# Patient Record
Sex: Male | Born: 1945 | Race: White | Hispanic: No | Marital: Married | Smoking: Never smoker
Health system: Southern US, Community
[De-identification: ages and names within clinical notes are randomized; demographics above are authoritative.]

## PROBLEM LIST (undated history)

## (undated) DIAGNOSIS — R350 Frequency of micturition: Secondary | ICD-10-CM

## (undated) DIAGNOSIS — I10 Essential (primary) hypertension: Secondary | ICD-10-CM

## (undated) DIAGNOSIS — M549 Dorsalgia, unspecified: Secondary | ICD-10-CM

## (undated) DIAGNOSIS — N529 Male erectile dysfunction, unspecified: Secondary | ICD-10-CM

## (undated) DIAGNOSIS — E7800 Pure hypercholesterolemia, unspecified: Secondary | ICD-10-CM

## (undated) DIAGNOSIS — I350 Nonrheumatic aortic (valve) stenosis: Secondary | ICD-10-CM

## (undated) DIAGNOSIS — K219 Gastro-esophageal reflux disease without esophagitis: Secondary | ICD-10-CM

## (undated) DIAGNOSIS — M1990 Unspecified osteoarthritis, unspecified site: Secondary | ICD-10-CM

## (undated) HISTORY — DX: Gastro-esophageal reflux disease without esophagitis: K21.9

## (undated) HISTORY — DX: Essential (primary) hypertension: I10

## (undated) HISTORY — DX: Dorsalgia, unspecified: M54.9

## (undated) HISTORY — DX: Pure hypercholesterolemia, unspecified: E78.00

## (undated) HISTORY — PX: LUMBAR DISC SURGERY: SHX700

## (undated) HISTORY — DX: Male erectile dysfunction, unspecified: N52.9

## (undated) HISTORY — PX: TONSILLECTOMY: SUR1361

## (undated) HISTORY — DX: Nonrheumatic aortic (valve) stenosis: I35.0

---

## 2011-04-01 ENCOUNTER — Ambulatory Visit (INDEPENDENT_AMBULATORY_CARE_PROVIDER_SITE_OTHER): Payer: Medicare Other

## 2011-04-01 DIAGNOSIS — J019 Acute sinusitis, unspecified: Secondary | ICD-10-CM

## 2011-04-01 DIAGNOSIS — Z23 Encounter for immunization: Secondary | ICD-10-CM

## 2011-04-01 DIAGNOSIS — J209 Acute bronchitis, unspecified: Secondary | ICD-10-CM

## 2013-03-05 ENCOUNTER — Encounter: Payer: Self-pay | Admitting: *Deleted

## 2013-03-05 ENCOUNTER — Encounter: Payer: Self-pay | Admitting: Interventional Cardiology

## 2013-03-06 ENCOUNTER — Encounter: Payer: Self-pay | Admitting: Interventional Cardiology

## 2013-03-06 ENCOUNTER — Ambulatory Visit (INDEPENDENT_AMBULATORY_CARE_PROVIDER_SITE_OTHER): Payer: Medicare Other | Admitting: Interventional Cardiology

## 2013-03-06 VITALS — BP 131/72 | HR 69 | Ht 71.0 in | Wt 198.0 lb

## 2013-03-06 DIAGNOSIS — I10 Essential (primary) hypertension: Secondary | ICD-10-CM

## 2013-03-06 DIAGNOSIS — K219 Gastro-esophageal reflux disease without esophagitis: Secondary | ICD-10-CM

## 2013-03-06 DIAGNOSIS — E785 Hyperlipidemia, unspecified: Secondary | ICD-10-CM | POA: Insufficient documentation

## 2013-03-06 DIAGNOSIS — I359 Nonrheumatic aortic valve disorder, unspecified: Secondary | ICD-10-CM | POA: Insufficient documentation

## 2013-03-06 DIAGNOSIS — I119 Hypertensive heart disease without heart failure: Secondary | ICD-10-CM

## 2013-03-06 NOTE — Progress Notes (Signed)
Patient ID: Zachary [name], male   DOB: [birth year], 67 y.o.   MRN: [id number]   Date: 03/06/2013 ID: Zachary [name], DOB 1945/08/21, MRN [id number] PCP: Lupe Carney, MD  Reason: Aortic stenosis  ASSESSMENT;  1. Aortic stenosis, asymptomatic. Moderately severe based on the echo information from 2013. 2. Hypertension, controlled 3. Hyperlipidemia 4. Acid reflux  PLAN:  1. 2 D doppler echo to assess for significant interval change compared to one year ago. 2. We spent time discussing the cardinal symptoms of aortic stenosis. We also discussed the natural history and reason for yearly echo for followup. 3. We discussed reporting symptoms as they arise.   SUBJECTIVE: Zachary [name] is a 67 y.o. male who is referred for evaluation of aortic stenosis. He voices no limitations in his activities. He specifically denies angina, dyspnea, and syncope. He did not know of having a heart murmur during his childhood or early adulthood. He was told a heart murmur during or me physicals. Only recently as he been told about this. One year ago he had an echocardiogram performed at the "[facility] which revealed moderately severe aortic stenosis with a peak velocity of 3.64 m/s. He's had no followup since that time.   No Known Allergies  Current Outpatient Prescriptions on File Prior to Visit  Medication Sig Dispense Refill  . lisinopril (PRINIVIL,ZESTRIL) 10 MG tablet Take 10 mg by mouth daily.      . ranitidine (ZANTAC) 150 MG capsule Take 150 mg by mouth 2 (two) times daily.      . rosuvastatin (CRESTOR) 10 MG tablet Take 10 mg by mouth daily.       No current facility-administered medications on file prior to visit.    Past Medical History  Diagnosis Date  . HTN (hypertension)   . Erectile dysfunction   . Hypercholesteremia   . GERD (gastroesophageal reflux disease)   . Back pain   . Aortic valvar stenosis     Past Surgical History  Procedure Laterality Date  . Tonsillectomy      . Lumbar disc surgery      History   Social History  . Marital Status: Single    Spouse Name: N/A    Number of Children: N/A  . Years of Education: N/A   Occupational History  . Not on file.   Social History Main Topics  . Smoking status: Never Smoker   . Smokeless tobacco: Not on file  . Alcohol Use: No  . Drug Use: No  . Sexual Activity: Not on file   Other Topics Concern  . Not on file   Social History Narrative  . No narrative on file    Family History  Problem Relation Age of Onset  . Heart disease Mother   . Diabetes Mother     ROS: Denies edema, claudication, transient neurological symptoms, palpitations, orthopnea, PND, and chest pain.. Other systems negative for complaints.  OBJECTIVE: BP 131/72  Pulse 69  Ht 5\' 11"  (1.803 m)  Wt 198 lb (89.812 kg)  BMI 27.63 kg/m2,  General: No acute distress, appears healthy HEENT: normal with good skin color Neck: JVD flat. Carotids 2+ right carotid with transmitted bruit. 1+ left carotid also with transmitted bruit. Chest: Clear to auscultation and percussion Cardiac: Murmur: 2-3/6 crescendo decrescendo systolic murmur loudest at the right upper and left upper sternal border. The murmur is also heard at the left lower sternal border.. Gallop: An S4 gallop is audible.Marland Kitchen Rhythm: Regular.. Other: No diastolic murmurs heard.  Abdomen: Bruit: Absent. Pulsation: Absent . Abdomen is soft. No tenderness is noted. Extremities: Edema: Absent. Pulses: Bilateral 2+ and symmetric Neuro: Normal Psych: Anxious  ECG: Normal

## 2013-03-06 NOTE — Patient Instructions (Signed)
Your physician recommends that you continue on your current medications as directed. Please refer to the Current Medication list given to you today.  Your physician has requested that you have an echocardiogram. Echocardiography is a painless test that uses sound waves to create images of your heart. It provides your doctor with information about the size and shape of your heart and how well your heart's chambers and valves are working. This procedure takes approximately one hour. There are no restrictions for this procedure.  Your physician wants you to follow-up in: 1 year You will receive a reminder letter in the mail two months in advance. If you don't receive a letter, please call our office to schedule the follow-up appointment.  If you are having any of the following symptoms: shortness of breath, syncope(passing out), chest pain/pressure please call the office [phone]

## 2013-03-25 ENCOUNTER — Ambulatory Visit (HOSPITAL_COMMUNITY): Payer: Medicare Other | Attending: Cardiology | Admitting: Radiology

## 2013-03-25 ENCOUNTER — Encounter: Payer: Self-pay | Admitting: Cardiology

## 2013-03-25 DIAGNOSIS — E785 Hyperlipidemia, unspecified: Secondary | ICD-10-CM | POA: Insufficient documentation

## 2013-03-25 DIAGNOSIS — I359 Nonrheumatic aortic valve disorder, unspecified: Secondary | ICD-10-CM | POA: Insufficient documentation

## 2013-03-25 NOTE — Progress Notes (Signed)
Echocardiogram performed.  

## 2013-03-26 ENCOUNTER — Telehealth: Payer: Self-pay

## 2013-03-26 NOTE — Telephone Encounter (Signed)
pt given results of echo.Moderate thickening of the left ventricle, normal ejection, moderate aortic stenosis. No significant change since the prior study. He needs clinical followup in one year.pt verbalized understanding.

## 2013-03-26 NOTE — Telephone Encounter (Signed)
Message copied by Jarvis Newcomer on Wed Mar 26, 2013  9:37 AM ------      Message from: Verdis Prime      Created: Tue Mar 25, 2013  5:38 PM       Moderate thickening of the left ventricle, normal ejection, moderate aortic stenosis. No significant change since the prior study. He needs clinical followup in one year. ------

## 2014-03-05 ENCOUNTER — Encounter: Payer: Self-pay | Admitting: Interventional Cardiology

## 2014-03-05 ENCOUNTER — Ambulatory Visit (INDEPENDENT_AMBULATORY_CARE_PROVIDER_SITE_OTHER): Payer: Medicare Other | Admitting: Interventional Cardiology

## 2014-03-05 VITALS — BP 122/80 | HR 81 | Ht 71.0 in | Wt 199.0 lb

## 2014-03-05 DIAGNOSIS — I359 Nonrheumatic aortic valve disorder, unspecified: Secondary | ICD-10-CM

## 2014-03-05 DIAGNOSIS — E785 Hyperlipidemia, unspecified: Secondary | ICD-10-CM

## 2014-03-05 DIAGNOSIS — I119 Hypertensive heart disease without heart failure: Secondary | ICD-10-CM

## 2014-03-05 NOTE — Patient Instructions (Signed)
Your physician recommends that you continue on your current medications as directed. Please refer to the Current Medication list given to you today.  Your physician has requested that you have an echocardiogram. Echocardiography is a painless test that uses sound waves to create images of your heart. It provides your doctor with information about the size and shape of your heart and how well your heart's chambers and valves are working. This procedure takes approximately one hour. There are no restrictions for this procedure.( To be scheduled in 11 months)  Your physician wants you to follow-up in: 12 months with Dr.Smith You will receive a reminder letter in the mail two months in advance. If you don't receive a letter, please call our office to schedule the follow-up appointment.

## 2014-03-05 NOTE — Progress Notes (Signed)
Patient ID: Zachary [name], male   DOB: Oct 07, 1945, 68 y.o.   MRN: [id number]    [address], [address], [place]  [address] Phone: [phone] Fax:  [phone]  Date:  03/05/2014   ID:  Zachary [name], DOB 1946-03-12, MRN [id number]  PCP:  [name] Coffin, MD   ASSESSMENT:  1. Moderate aortic stenosis, without symptoms 2. Hypertension, essential3. Hyperlipidemia  PLAN:  1. 2-D Doppler echocardiogram and 11 months 2. Office visit in 12 months 3. Notify if angina, syncope, or dyspnea   SUBJECTIVE: Zachary [name] is a 68 y.o. male who is doing well. He is not quite as vigorously participating in activities as he has over the past 2 or 3 years. He is retired. He doesn't have the same interest that he had. He does still get out and do nearly everything that he wants to do. He has had some dizziness associated with tinnitus   Wt Readings from Last 3 Encounters:  03/05/14 199 lb (90.266 kg)  03/06/13 198 lb (89.812 kg)     Past Medical History  Diagnosis Date  . HTN (hypertension)   . Erectile dysfunction   . Hypercholesteremia   . GERD (gastroesophageal reflux disease)   . Back pain   . Aortic valvar stenosis     Current Outpatient Prescriptions  Medication Sig Dispense Refill  . acetaminophen (TYLENOL) 500 MG tablet Take 500 mg by mouth every 6 (six) hours as needed.    Marland Kitchen lisinopril (PRINIVIL,ZESTRIL) 10 MG tablet Take 10 mg by mouth daily.    . ranitidine (ZANTAC) 150 MG capsule Take 150 mg by mouth 2 (two) times daily.    . rosuvastatin (CRESTOR) 10 MG tablet Take 10 mg by mouth daily.     No current facility-administered medications for this visit.    Allergies:   No Known Allergies  Social History:  The patient  reports that he has never smoked. He does not have any smokeless tobacco history on file. He reports that he does not drink alcohol or use illicit drugs.   ROS:  Please see the history of present illness.   Orthopnea, PND, lower extremity  swelling, palpitations, or syncope   All other systems reviewed and negative.   OBJECTIVE: VS:  BP 122/80 mmHg  Pulse 81  Ht 5\' 11"  (1.803 m)  Wt 199 lb (90.266 kg)  BMI 27.77 kg/m2 Well nourished, well developed, in no acute distress,over the stated age 51: normal Neck: JVD flat. Carotid bruit absent  Cardiac:  normal S1, S2; RRR; 2 3/6 high-pitched crescendo decrescendo apical and left mid sternal murmur loudest at the right upper sternal border. No diastolic murmurs heard. Lungs:  clear to auscultation bilaterally, no wheezing, rhonchi or rales Abd: soft, nontender, no hepatomegaly Ext: Edema absent. Pulses 2+ and symmetric Skin: warm and dry Neuro:  CNs 2-12 intact, no focal abnormalities noted  EKG:  Normal sinus rhythm with small inferior Q waves. Unchanged from prior tracing. Prominent voltage.       Signed, Illene Labrador III, MD 03/05/2014 2:45 PM

## 2014-03-16 ENCOUNTER — Ambulatory Visit: Payer: Medicare Other | Admitting: Interventional Cardiology

## 2014-10-19 ENCOUNTER — Other Ambulatory Visit: Payer: Self-pay

## 2015-02-10 ENCOUNTER — Other Ambulatory Visit (HOSPITAL_COMMUNITY): Payer: Medicare Other

## 2015-02-10 ENCOUNTER — Ambulatory Visit (HOSPITAL_COMMUNITY): Payer: Medicare Other | Attending: Cardiology

## 2015-02-10 ENCOUNTER — Other Ambulatory Visit: Payer: Self-pay

## 2015-02-10 DIAGNOSIS — I10 Essential (primary) hypertension: Secondary | ICD-10-CM | POA: Diagnosis not present

## 2015-02-10 DIAGNOSIS — E785 Hyperlipidemia, unspecified: Secondary | ICD-10-CM | POA: Diagnosis not present

## 2015-02-10 DIAGNOSIS — I352 Nonrheumatic aortic (valve) stenosis with insufficiency: Secondary | ICD-10-CM | POA: Diagnosis not present

## 2015-02-10 DIAGNOSIS — I77810 Thoracic aortic ectasia: Secondary | ICD-10-CM | POA: Insufficient documentation

## 2015-02-10 DIAGNOSIS — I517 Cardiomegaly: Secondary | ICD-10-CM | POA: Diagnosis not present

## 2015-02-10 DIAGNOSIS — I359 Nonrheumatic aortic valve disorder, unspecified: Secondary | ICD-10-CM | POA: Diagnosis not present

## 2015-02-19 ENCOUNTER — Telehealth: Payer: Self-pay | Admitting: Interventional Cardiology

## 2015-02-19 NOTE — Telephone Encounter (Signed)
Called patient's wife.  Told her that her request for earlier appt is being forwarded to to Dr Tamala Julian and his nurse for scheduling.  Appt is for yearly f/u.

## 2015-02-19 NOTE — Telephone Encounter (Signed)
New message   Pt wife is calling and she wants to know have you been working on getting pt in to see Dr.Smith  I offered first available and with a PA she declined

## 2015-02-22 NOTE — Telephone Encounter (Signed)
Called to schedule work in appt with [place]. Zachary [name]

## 2015-02-22 NOTE — Telephone Encounter (Signed)
Pt wife aware of work in appt with [place]. Scheduled for 11/9 @ 4pm. She sts that pt is asymptomatic, and anxious about his upcoming appt. Adv pt to call the office if symptoms develop (CP, syncope, sob). Pt wife verbalized understanding.

## 2015-02-22 NOTE — Telephone Encounter (Signed)
F/u    Pt's wife returning Lisa's phone call.

## 2015-03-03 ENCOUNTER — Encounter: Payer: Self-pay | Admitting: Interventional Cardiology

## 2015-03-03 ENCOUNTER — Ambulatory Visit (INDEPENDENT_AMBULATORY_CARE_PROVIDER_SITE_OTHER): Payer: Medicare Other | Admitting: Interventional Cardiology

## 2015-03-03 VITALS — BP 100/58 | HR 86 | Ht 72.0 in | Wt 210.4 lb

## 2015-03-03 DIAGNOSIS — I119 Hypertensive heart disease without heart failure: Secondary | ICD-10-CM

## 2015-03-03 DIAGNOSIS — E785 Hyperlipidemia, unspecified: Secondary | ICD-10-CM

## 2015-03-03 DIAGNOSIS — I359 Nonrheumatic aortic valve disorder, unspecified: Secondary | ICD-10-CM | POA: Diagnosis not present

## 2015-03-03 LAB — CBC WITH DIFFERENTIAL/PLATELET
BASOS PCT: 1 % (ref 0–1)
Basophils Absolute: 0.1 10*3/uL (ref 0.0–0.1)
EOS ABS: 0.4 10*3/uL (ref 0.0–0.7)
Eosinophils Relative: 5 % (ref 0–5)
HCT: 41.3 % (ref 39.0–52.0)
Hemoglobin: 14.1 g/dL (ref 13.0–17.0)
LYMPHS ABS: 2.5 10*3/uL (ref 0.7–4.0)
Lymphocytes Relative: 32 % (ref 12–46)
MCH: 32.7 pg (ref 26.0–34.0)
MCHC: 34.1 g/dL (ref 30.0–36.0)
MCV: 95.8 fL (ref 78.0–100.0)
MONO ABS: 0.8 10*3/uL (ref 0.1–1.0)
MONOS PCT: 10 % (ref 3–12)
MPV: 9.3 fL (ref 8.6–12.4)
Neutro Abs: 4.1 10*3/uL (ref 1.7–7.7)
Neutrophils Relative %: 52 % (ref 43–77)
PLATELETS: 257 10*3/uL (ref 150–400)
RBC: 4.31 MIL/uL (ref 4.22–5.81)
RDW: 12.7 % (ref 11.5–15.5)
WBC: 7.8 10*3/uL (ref 4.0–10.5)

## 2015-03-03 LAB — BASIC METABOLIC PANEL
BUN: 16 mg/dL (ref 7–25)
CALCIUM: 9.2 mg/dL (ref 8.6–10.3)
CHLORIDE: 104 mmol/L (ref 98–110)
CO2: 30 mmol/L (ref 20–31)
CREATININE: 1.3 mg/dL — AB (ref 0.70–1.25)
GLUCOSE: 93 mg/dL (ref 65–99)
Potassium: 4.5 mmol/L (ref 3.5–5.3)
Sodium: 140 mmol/L (ref 135–146)

## 2015-03-03 LAB — PROTIME-INR
INR: 0.96 (ref <1.50)
PROTHROMBIN TIME: 12.9 s (ref 11.6–15.2)

## 2015-03-03 NOTE — Progress Notes (Signed)
Cardiology Office Note   Date:  03/03/2015   ID:  Zachary [name], DOB [birth year], MRN [id number]  PCP:  Lester Kinsman, PA-C  Cardiologist:  Sinclair Grooms, MD   Chief Complaint  Patient presents with  . Aortic Stenosis      History of Present Illness: Zachary [name] is a 69 y.o. male who presents for  Calcific aortic stenosis, hypertension, erectile dysfunction, and hyperlipidemia.   Since the last office visit the patient has experienced exertional fatigue. He has had 2 episodes of dizziness but no syncope. He denies chest discomfort. He has not had lower extremity swelling or orthopnea. No prolonged palpitations or arrhythmias.    Past Medical History  Diagnosis Date  . HTN (hypertension)   . Erectile dysfunction   . Hypercholesteremia   . GERD (gastroesophageal reflux disease)   . Back pain   . Aortic valvar stenosis     Past Surgical History  Procedure Laterality Date  . Tonsillectomy    . Lumbar disc surgery       Current Outpatient Prescriptions  Medication Sig Dispense Refill  . acetaminophen (TYLENOL) 500 MG tablet Take 500 mg by mouth every 6 (six) hours as needed for mild pain.    Marland Kitchen atorvastatin (LIPITOR) 40 MG tablet Take 40 mg by mouth daily.    Marland Kitchen lisinopril (PRINIVIL,ZESTRIL) 10 MG tablet Take 10 mg by mouth daily.    . ranitidine (ZANTAC) 150 MG capsule Take 150 mg by mouth 2 (two) times daily.     No current facility-administered medications for this visit.    Allergies:   Review of patient's allergies indicates no known allergies.    Social History:  The patient  reports that he has never smoked. He has never used smokeless tobacco. He reports that he does not drink alcohol or use illicit drugs.   Family History:  The patient's family history includes Cancer in his father; Diabetes in his mother; Healthy in his sister, sister, and sister; Heart disease in his mother; Hyperlipidemia in his brother.    ROS:  Please see the history of  present illness.   Otherwise, review of systems are positive for  Other than fatigue, no significant complaints..   All other systems are reviewed and negative.    PHYSICAL EXAM: VS:  BP 100/58 mmHg  Pulse 86  Ht 6' (1.829 m)  Wt 95.437 kg (210 lb 6.4 oz)  BMI 28.53 kg/m2  SpO2 94% , BMI Body mass index is 28.53 kg/(m^2). GEN: Well nourished, well developed, in no acute distress HEENT: normal Neck: no JVD, carotid bruits, or masses Cardiac: RRR.  There  Is a 4/6 medium pitched crescendo decrescendo systolic murmur  Along the left lower sternal border and at the apex. Murmur radiates to the left upper sternal border. There is no rub but an S4 gallop is heard. There is  no edema. Respiratory:  clear to auscultation bilaterally, normal work of breathing. GI: soft, nontender, nondistended, + BS MS: no deformity or atrophy Skin: warm and dry, no rash Neuro:  Strength and sensation are intact Psych: euthymic mood, full affect   EKG:  EKG  Is not ordered today.    Recent Labs: No results found for requested labs within last 365 days.    Lipid Panel No results found for: CHOL, TRIG, HDL, CHOLHDL, VLDL, LDLCALC, LDLDIRECT    Wt Readings from Last 3 Encounters:  03/03/15 95.437 kg (210 lb 6.4 oz)  03/05/14 90.266 kg (199 lb)  03/06/13 89.812 kg (198 lb)      Other studies Reviewed: Additional studies/ records that were reviewed today include:  Serial echoes done between 2013 and now. The findings include  Peak velocity across the aortic valve is 4.43 m/s , moderate LVH, preserved EF of 65%.  There also appears to be mild mitral stenosis by exam.    ASSESSMENT AND PLAN:  1. Aortic valve disorder  Critical aortic stenosis with peak velocity greater than 4.4 m/s. Patient also has symptoms. 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 Hyperlipidemia  On therapy   4. Possible mitral stenosis Mentioned  on echocardiography. We will obtain hemodynamic data at the time of coronary angiography and right heart cath.  Current medicines are reviewed at length with the patient today.  The patient has the following concerns regarding medicines:  none.  The following changes/actions have been instituted:     Left heart , right heart, and coronary angiography.The patient was advised about the risks and benefits of coronary angiography. The risks including death, stroke, kidney failure, heart attack, limb ischemia, bleeding and allergy were discussed and accepted by the patient.  Questions were answered and the patient accepts the procedure and willing to proceed.   plan is referral 2 heart valve teeny after left and right heart cath.  Labs/ tests ordered today include:  Orders Placed This Encounter  Procedures  . Basic metabolic panel  . CBC w/Diff  . INR/PT     Disposition:   FU with HS in 1 month  Signed, Sinclair Grooms, MD  03/03/2015 6:39 PM    [facility] [place], [place], [place]  [address] Phone: [phone]; Fax: [phone]

## 2015-03-03 NOTE — Patient Instructions (Signed)
Medication Instructions:  Your physician recommends that you continue on your current medications as directed. Please refer to the Current Medication list given to you today.   Labwork: Bmet, Cbc, Pt/Inr today  Testing/Procedures: Your physician has requested that you have a cardiac catheterization. Cardiac catheterization is used to diagnose and/or treat various heart conditions. Doctors may recommend this procedure for a number of different reasons. The most common reason is to evaluate chest pain. Chest pain can be a symptom of coronary artery disease (CAD), and cardiac catheterization can show whether plaque is narrowing or blocking your heart's arteries. This procedure is also used to evaluate the valves, as well as measure the blood flow and oxygen levels in different parts of your heart. For further information please visit [email or web address]. Please follow instruction sheet, as given.   Follow-Up: Your physician recommends that you schedule a follow-up appointment pending procedure   Any Other Special Instructions Will Be Listed Below (If Applicable).     If you need a refill on your cardiac medications before your next appointment, please call your pharmacy.

## 2015-03-06 ENCOUNTER — Other Ambulatory Visit: Payer: Self-pay | Admitting: Interventional Cardiology

## 2015-03-10 ENCOUNTER — Ambulatory Visit (HOSPITAL_COMMUNITY)
Admission: RE | Admit: 2015-03-10 | Discharge: 2015-03-10 | Disposition: A | Discharge status: Home / Self Care | Payer: Medicare Other | Source: Ambulatory Visit | Attending: Interventional Cardiology | Admitting: Interventional Cardiology

## 2015-03-10 ENCOUNTER — Encounter (HOSPITAL_COMMUNITY)
Admission: RE | Disposition: A | Discharge status: Home / Self Care | Payer: Medicare Other | Source: Ambulatory Visit | Attending: Interventional Cardiology

## 2015-03-10 DIAGNOSIS — Z8249 Family history of ischemic heart disease and other diseases of the circulatory system: Secondary | ICD-10-CM | POA: Insufficient documentation

## 2015-03-10 DIAGNOSIS — N529 Male erectile dysfunction, unspecified: Secondary | ICD-10-CM | POA: Diagnosis not present

## 2015-03-10 DIAGNOSIS — E7800 Pure hypercholesterolemia, unspecified: Secondary | ICD-10-CM | POA: Diagnosis not present

## 2015-03-10 DIAGNOSIS — K219 Gastro-esophageal reflux disease without esophagitis: Secondary | ICD-10-CM | POA: Insufficient documentation

## 2015-03-10 DIAGNOSIS — I359 Nonrheumatic aortic valve disorder, unspecified: Secondary | ICD-10-CM | POA: Diagnosis present

## 2015-03-10 DIAGNOSIS — I119 Hypertensive heart disease without heart failure: Secondary | ICD-10-CM | POA: Diagnosis present

## 2015-03-10 DIAGNOSIS — I25119 Atherosclerotic heart disease of native coronary artery with unspecified angina pectoris: Secondary | ICD-10-CM | POA: Diagnosis not present

## 2015-03-10 DIAGNOSIS — I350 Nonrheumatic aortic (valve) stenosis: Secondary | ICD-10-CM | POA: Insufficient documentation

## 2015-03-10 DIAGNOSIS — I2510 Atherosclerotic heart disease of native coronary artery without angina pectoris: Secondary | ICD-10-CM

## 2015-03-10 HISTORY — PX: CARDIAC CATHETERIZATION: SHX172

## 2015-03-10 LAB — POCT ACTIVATED CLOTTING TIME
Activated Clotting Time: 202 seconds
Activated Clotting Time: 251 seconds

## 2015-03-10 LAB — POCT I-STAT 3, ART BLOOD GAS (G3+)
ACID-BASE DEFICIT: 2 mmol/L (ref 0.0–2.0)
BICARBONATE: 24.2 meq/L — AB (ref 20.0–24.0)
O2 SAT: 95 %
PO2 ART: 81 mmHg (ref 80.0–100.0)
TCO2: 26 mmol/L (ref 0–100)
pCO2 arterial: 45.5 mmHg — ABNORMAL HIGH (ref 35.0–45.0)
pH, Arterial: 7.333 — ABNORMAL LOW (ref 7.350–7.450)

## 2015-03-10 LAB — POCT I-STAT 3, VENOUS BLOOD GAS (G3P V)
ACID-BASE DEFICIT: 3 mmol/L — AB (ref 0.0–2.0)
BICARBONATE: 23.6 meq/L (ref 20.0–24.0)
O2 SAT: 61 %
PCO2 VEN: 46.8 mmHg (ref 45.0–50.0)
PO2 VEN: 35 mmHg (ref 30.0–45.0)
TCO2: 25 mmol/L (ref 0–100)
pH, Ven: 7.311 — ABNORMAL HIGH (ref 7.250–7.300)

## 2015-03-10 SURGERY — RIGHT/LEFT HEART CATH AND CORONARY ANGIOGRAPHY
Anesthesia: LOCAL

## 2015-03-10 MED ORDER — MIDAZOLAM HCL 2 MG/2ML IJ SOLN
INTRAMUSCULAR | Status: AC
  Filled 2015-03-10: qty 2

## 2015-03-10 MED ORDER — FENTANYL CITRATE (PF) 100 MCG/2ML IJ SOLN
INTRAMUSCULAR | Status: AC
  Filled 2015-03-10: qty 2

## 2015-03-10 MED ORDER — SODIUM CHLORIDE 0.9 % WEIGHT BASED INFUSION: 1.0000 mL/kg/h | INTRAVENOUS | Status: AC

## 2015-03-10 MED ORDER — LIDOCAINE HCL (PF) 1 % IJ SOLN
INTRAMUSCULAR | Status: AC
  Filled 2015-03-10: qty 30

## 2015-03-10 MED ORDER — ASPIRIN 81 MG PO CHEW
81.0000 mg | CHEWABLE_TABLET | ORAL | Status: AC
  Administered 2015-03-10: 81 mg via ORAL

## 2015-03-10 MED ORDER — MIDAZOLAM HCL 2 MG/2ML IJ SOLN
INTRAMUSCULAR | Status: DC | PRN
  Administered 2015-03-10 (×2): 1 mg via INTRAVENOUS

## 2015-03-10 MED ORDER — ADENOSINE (DIAGNOSTIC) 140MCG/KG/MIN
INTRAVENOUS | Status: DC | PRN
  Administered 2015-03-10: 140 ug/kg/min via INTRAVENOUS

## 2015-03-10 MED ORDER — OXYCODONE-ACETAMINOPHEN 5-325 MG PO TABS: 1.0000 | ORAL_TABLET | ORAL | Status: DC | PRN

## 2015-03-10 MED ORDER — ACETAMINOPHEN 325 MG PO TABS: 650.0000 mg | ORAL_TABLET | ORAL | Status: DC | PRN

## 2015-03-10 MED ORDER — SODIUM CHLORIDE 0.9 % IV SOLN
INTRAVENOUS | Status: DC
  Administered 2015-03-10 (×2): via INTRAVENOUS

## 2015-03-10 MED ORDER — SODIUM CHLORIDE 0.9 % IV SOLN: 250.0000 mL | INTRAVENOUS | Status: DC | PRN

## 2015-03-10 MED ORDER — ASPIRIN 81 MG PO CHEW
CHEWABLE_TABLET | ORAL | Status: AC
  Administered 2015-03-10: 81 mg via ORAL
  Filled 2015-03-10: qty 1

## 2015-03-10 MED ORDER — FENTANYL CITRATE (PF) 100 MCG/2ML IJ SOLN
INTRAMUSCULAR | Status: DC | PRN
  Administered 2015-03-10: 50 ug via INTRAVENOUS

## 2015-03-10 MED ORDER — SODIUM CHLORIDE 0.9 % IJ SOLN: 3.0000 mL | Freq: Two times a day (BID) | INTRAMUSCULAR | Status: DC

## 2015-03-10 MED ORDER — HEPARIN SODIUM (PORCINE) 1000 UNIT/ML IJ SOLN
INTRAMUSCULAR | Status: DC | PRN
  Administered 2015-03-10: 3000 [IU] via INTRAVENOUS
  Administered 2015-03-10: 4500 [IU] via INTRAVENOUS

## 2015-03-10 MED ORDER — ONDANSETRON HCL 4 MG/2ML IJ SOLN: 4.0000 mg | Freq: Four times a day (QID) | INTRAMUSCULAR | Status: DC | PRN

## 2015-03-10 MED ORDER — VERAPAMIL HCL 2.5 MG/ML IV SOLN
INTRAVENOUS | Status: DC | PRN
  Administered 2015-03-10: 11:00:00 via INTRA_ARTERIAL

## 2015-03-10 MED ORDER — HEPARIN SODIUM (PORCINE) 1000 UNIT/ML IJ SOLN
INTRAMUSCULAR | Status: AC
  Filled 2015-03-10: qty 1

## 2015-03-10 MED ORDER — ADENOSINE 12 MG/4ML IV SOLN
16.0000 mL | Freq: Once | INTRAVENOUS | Status: DC
  Filled 2015-03-10: qty 16

## 2015-03-10 MED ORDER — HEPARIN (PORCINE) IN NACL 2-0.9 UNIT/ML-% IJ SOLN
INTRAMUSCULAR | Status: AC
Start: 2015-03-10 — End: 2015-03-10
  Filled 2015-03-10: qty 1000

## 2015-03-10 MED ORDER — SODIUM CHLORIDE 0.9 % IJ SOLN: 3.0000 mL | INTRAMUSCULAR | Status: DC | PRN

## 2015-03-10 MED ORDER — VERAPAMIL HCL 2.5 MG/ML IV SOLN
INTRAVENOUS | Status: AC
  Filled 2015-03-10: qty 2

## 2015-03-10 SURGICAL SUPPLY — 17 items
CATH BALLN WEDGE 5F 110CM (CATHETERS) ×1 IMPLANT
CATH INFINITI 5 FR JL3.5 (CATHETERS) ×2 IMPLANT
CATH INFINITI 5FR JL5 (CATHETERS) ×1 IMPLANT
CATH INFINITI JR4 5F (CATHETERS) ×2 IMPLANT
CATH VISTA GUIDE 6FR XBLAD3.5 (CATHETERS) ×1 IMPLANT
DEVICE RAD COMP TR BAND LRG (VASCULAR PRODUCTS) ×2 IMPLANT
GLIDESHEATH SLEND A-KIT 6F 22G (SHEATH) ×2 IMPLANT
GUIDEWIRE PRESSURE COMET II (WIRE) ×1 IMPLANT
KIT ESSENTIALS PG (KITS) ×1 IMPLANT
KIT HEART LEFT (KITS) ×2 IMPLANT
PACK CARDIAC CATHETERIZATION (CUSTOM PROCEDURE TRAY) ×2 IMPLANT
SHEATH FAST CATH BRACH 5F 5CM (SHEATH) ×1 IMPLANT
TRANSDUCER W/STOPCOCK (MISCELLANEOUS) ×3 IMPLANT
TUBING CIL FLEX 10 FLL-RA (TUBING) ×2 IMPLANT
WIRE EMERALD ST .035X150CM (WIRE) ×1 IMPLANT
WIRE HI TORQ VERSACORE-J 145CM (WIRE) ×1 IMPLANT
WIRE SAFE-T 1.5MM-J .035X260CM (WIRE) ×3 IMPLANT

## 2015-03-10 NOTE — Interval H&P Note (Signed)
Cath Lab Visit (complete for each Cath Lab visit)  Clinical Evaluation Leading to the Procedure:   ACS: No.  Non-ACS:    Anginal Classification: CCS III  Anti-ischemic medical therapy: Minimal Therapy (1 class of medications)  Non-Invasive Test Results: No non-invasive testing performed  Prior CABG: No previous CABG      History and Physical Interval Note:  03/10/2015 10:06 AM  Zachary [name]  has presented today for surgery, with the diagnosis of aortic stenosis  The various methods of treatment have been discussed with the patient and family. After consideration of risks, benefits and other options for treatment, the patient has consented to  Procedure(s): Right/Left Heart Cath and Coronary Angiography (N/A) as a surgical intervention .  The patient's history has been reviewed, patient examined, no change in status, stable for surgery.  I have reviewed the patient's chart and labs.  Questions were answered to the patient's satisfaction.     Sinclair Grooms

## 2015-03-10 NOTE — H&P (View-Only) (Signed)
  Cardiology Office Note   Date:  03/03/2015   ID:  Zachary [name], DOB 08/30/1945, MRN [id number]  PCP:  FERGUSON, DOROTHY, PA-C  Cardiologist:  Kalayah Leske III,Deklyn Gibbon W, MD   Chief Complaint  Patient presents with  . Aortic Stenosis      History of Present Illness: Zachary [name] is a 69 y.o. male who presents for  Calcific aortic stenosis, hypertension, erectile dysfunction, and hyperlipidemia.   Since the last office visit the patient has experienced exertional fatigue. He has had 2 episodes of dizziness but no syncope. He denies chest discomfort. He has not had lower extremity swelling or orthopnea. No prolonged palpitations or arrhythmias.    Past Medical History  Diagnosis Date  . HTN (hypertension)   . Erectile dysfunction   . Hypercholesteremia   . GERD (gastroesophageal reflux disease)   . Back pain   . Aortic valvar stenosis     Past Surgical History  Procedure Laterality Date  . Tonsillectomy    . Lumbar disc surgery       Current Outpatient Prescriptions  Medication Sig Dispense Refill  . acetaminophen (TYLENOL) 500 MG tablet Take 500 mg by mouth every 6 (six) hours as needed for mild pain.    . atorvastatin (LIPITOR) 40 MG tablet Take 40 mg by mouth daily.    . lisinopril (PRINIVIL,ZESTRIL) 10 MG tablet Take 10 mg by mouth daily.    . ranitidine (ZANTAC) 150 MG capsule Take 150 mg by mouth 2 (two) times daily.     No current facility-administered medications for this visit.    Allergies:   Review of patient's allergies indicates no known allergies.    Social History:  The patient  reports that he has never smoked. He has never used smokeless tobacco. He reports that he does not drink alcohol or use illicit drugs.   Family History:  The patient's family history includes Cancer in his father; Diabetes in his mother; Healthy in his sister, sister, and sister; Heart disease in his mother; Hyperlipidemia in his brother.    ROS:  Please see the history of  present illness.   Otherwise, review of systems are positive for  Other than fatigue, no significant complaints..   All other systems are reviewed and negative.    PHYSICAL EXAM: VS:  BP 100/58 mmHg  Pulse 86  Ht 6' (1.829 m)  Wt 95.437 kg (210 lb 6.4 oz)  BMI 28.53 kg/m2  SpO2 94% , BMI Body mass index is 28.53 kg/(m^2). GEN: Well nourished, well developed, in no acute distress HEENT: normal Neck: no JVD, carotid bruits, or masses Cardiac: RRR.  There  Is a 4/6 medium pitched crescendo decrescendo systolic murmur  Along the left lower sternal border and at the apex. Murmur radiates to the left upper sternal border. There is no rub but an S4 gallop is heard. There is  no edema. Respiratory:  clear to auscultation bilaterally, normal work of breathing. GI: soft, nontender, nondistended, + BS MS: no deformity or atrophy Skin: warm and dry, no rash Neuro:  Strength and sensation are intact Psych: euthymic mood, full affect   EKG:  EKG  Is not ordered today.    Recent Labs: No results found for requested labs within last 365 days.    Lipid Panel No results found for: CHOL, TRIG, HDL, CHOLHDL, VLDL, LDLCALC, LDLDIRECT    Wt Readings from Last 3 Encounters:  03/03/15 95.437 kg (210 lb 6.4 oz)  03/05/14 90.266 kg (199 lb)    03/06/13 89.812 kg (198 lb)      Other studies Reviewed: Additional studies/ records that were reviewed today include:  Serial echoes done between 2013 and now. The findings include  Peak velocity across the aortic valve is 4.43 m/s , moderate LVH, preserved EF of 65%.  There also appears to be mild mitral stenosis by exam.    ASSESSMENT AND PLAN:  1. Aortic valve disorder  Critical aortic stenosis with peak velocity greater than 4.4 m/s. Patient also has symptoms. 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 Hyperlipidemia  On therapy   4. Possible mitral stenosis Mentioned  on echocardiography. We will obtain hemodynamic data at the time of coronary angiography and right heart cath.  Current medicines are reviewed at length with the patient today.  The patient has the following concerns regarding medicines:  none.  The following changes/actions have been instituted:     Left heart , right heart, and coronary angiography.The patient was advised about the risks and benefits of coronary angiography. The risks including death, stroke, kidney failure, heart attack, limb ischemia, bleeding and allergy were discussed and accepted by the patient.  Questions were answered and the patient accepts the procedure and willing to proceed.   plan is referral 2 heart valve teeny after left and right heart cath.  Labs/ tests ordered today include:  Orders Placed This Encounter  Procedures  . Basic metabolic panel  . CBC w/Diff  . INR/PT     Disposition:   FU with HS in 1 month  Signed, Anaily Ashbaugh III,Jermond Burkemper W, MD  03/03/2015 6:39 PM    [facility] [address], [place], [place]  [address] Phone: [phone]; Fax: [phone]   

## 2015-03-10 NOTE — Discharge Instructions (Signed)
Radial Site Care °Refer to this sheet in the next few weeks. These instructions provide you with information about caring for yourself after your procedure. Your health care provider may also give you more specific instructions. Your treatment has been planned according to current medical practices, but problems sometimes occur. Call your health care provider if you have any problems or questions after your procedure. °WHAT TO EXPECT AFTER THE PROCEDURE °After your procedure, it is typical to have the following: °· Bruising at the radial site that usually fades within 1-2 weeks. °· Blood collecting in the tissue (hematoma) that may be painful to the touch. It should usually decrease in size and tenderness within 1-2 weeks. °HOME CARE INSTRUCTIONS °· Take medicines only as directed by your health care provider. °· You may shower 24-48 hours after the procedure or as directed by your health care provider. Remove the bandage (dressing) and gently wash the site with plain soap and water. Pat the area dry with a clean towel. Do not rub the site, because this may cause bleeding. °· Do not take baths, swim, or use a hot tub until your health care provider approves. °· Check your insertion site every day for redness, swelling, or drainage. °· Do not apply powder or lotion to the site. °· Do not flex or bend the affected arm for 24 hours or as directed by your health care provider. °· Do not push or pull heavy objects with the affected arm for 24 hours or as directed by your health care provider. °· Do not lift over 10 lb (4.5 kg) for 5 days after your procedure or as directed by your health care provider. °· Ask your health care provider when it is okay to: °¨ Return to work or school. °¨ Resume usual physical activities or sports. °¨ Resume sexual activity. °· Do not drive home if you are discharged the same day as the procedure. Have someone else drive you. °· You may drive 24 hours after the procedure unless otherwise  instructed by your health care provider. °· Do not operate machinery or power tools for 24 hours after the procedure. °· If your procedure was done as an outpatient procedure, which means that you went home the same day as your procedure, a responsible adult should be with you for the first 24 hours after you arrive home. °· Keep all follow-up visits as directed by your health care provider. This is important. °SEEK MEDICAL CARE IF: °· You have a fever. °· You have chills. °· You have increased bleeding from the radial site. Hold pressure on the site. °SEEK IMMEDIATE MEDICAL CARE IF: °· You have unusual pain at the radial site. °· You have redness, warmth, or swelling at the radial site. °· You have drainage (other than a small amount of blood on the dressing) from the radial site. °· The radial site is bleeding, and the bleeding does not stop after 30 minutes of holding steady pressure on the site. °· Your arm or hand becomes pale, cool, tingly, or numb. °  °This information is not intended to replace advice given to you by your health care provider. Make sure you discuss any questions you have with your health care provider. °  °Document Released: 05/13/2010 Document Revised: 05/01/2014 Document Reviewed: 10/27/2013 °[employer] Interactive Patient Education ©2016 [employer] ° °

## 2015-03-11 ENCOUNTER — Encounter (HOSPITAL_COMMUNITY): Payer: Self-pay | Admitting: Interventional Cardiology

## 2015-03-11 MED FILL — Lidocaine HCl Local Preservative Free (PF) Inj 1%: INTRAMUSCULAR | Qty: 30 | Status: AC

## 2015-03-11 MED FILL — Heparin Sodium (Porcine) 2 Unit/ML in Sodium Chloride 0.9%: INTRAMUSCULAR | Qty: 500 | Status: AC

## 2015-03-12 ENCOUNTER — Telehealth: Payer: Self-pay | Admitting: Interventional Cardiology

## 2015-03-12 NOTE — Telephone Encounter (Signed)
Zachary [name] was supposed be set up to see the surgeons for aortic valve replacement. The family hasn't heard anything yet so I called Ryan today. Just letting you know so that you can determine how this fell through the cracks.

## 2015-03-12 NOTE — Telephone Encounter (Signed)
Patient was told to call office if they have not hear from the surgeon and to let Dr. Tamala Julian know. Will forward to Dr. Tamala Julian.

## 2015-03-12 NOTE — Telephone Encounter (Signed)
New message     Pt had a cath last wed.  Calling to let Dr Tamala Julian know they have not heard from the surgeon.

## 2015-03-17 ENCOUNTER — Other Ambulatory Visit: Payer: Self-pay | Admitting: *Deleted

## 2015-03-17 ENCOUNTER — Institutional Professional Consult (permissible substitution) (INDEPENDENT_AMBULATORY_CARE_PROVIDER_SITE_OTHER): Payer: Medicare Other | Admitting: Surgery

## 2015-03-17 ENCOUNTER — Encounter: Payer: Self-pay | Admitting: Surgery

## 2015-03-17 VITALS — BP 129/74 | HR 105 | Resp 18 | Ht 72.0 in | Wt 209.0 lb

## 2015-03-17 DIAGNOSIS — I350 Nonrheumatic aortic (valve) stenosis: Secondary | ICD-10-CM

## 2015-03-17 NOTE — Progress Notes (Signed)
Cardiothoracic Surgery Consultation   PCP is Zachary Kinsman, PA-C Referring Provider is Zachary Crome, MD  Chief Complaint  Patient presents with  . Aortic Stenosis    eval for AVR    HPI:  The patient is a 69 year old gentleman with hypertension, hyperlipidemia and known aortic stenosis followed by Dr. Tamala [name]. His echo in 03/2013 showed a mean gradient of 28 mm Hg consistent with moderate AS. He now presents with progressive exertional fatigue and a couple episodes where he felt like he might pass out if he did not stop what he was doing. He had an echo on 02/10/2015 that shows progression to severe AS with a mean gradient of 49 mm Hg and an AVA of 1.08 cm2. His LVEF has remained normal at 60-65% with moderate LVH. The aortic root was measured at 39 mm Hg and the ascending aorta at 42 mm Hg.    Past Medical History  Diagnosis Date  . HTN (hypertension)   . Erectile dysfunction   . Hypercholesteremia   . GERD (gastroesophageal reflux disease)   . Back pain   . Aortic valvar stenosis     Past Surgical History  Procedure Laterality Date  . Tonsillectomy    . Lumbar disc surgery    . Cardiac catheterization N/A 03/10/2015    Procedure: Right/Left Heart Cath and Coronary Angiography;  Surgeon: Zachary Crome, MD;  Location: [facility];  Service: Cardiovascular;  Laterality: N/A;  . Cardiac catheterization N/A 03/10/2015    Procedure: Intravascular Pressure Wire/FFR Study;  Surgeon: Zachary Crome, MD;  Location: [facility];  Service: Cardiovascular;  Laterality: N/A;    Family History  Problem Relation Age of Onset  . Heart disease Mother   . Diabetes Mother   . Cancer Father   . Healthy Sister   . Hyperlipidemia Brother   . Healthy Sister   . Healthy Sister     Social History Social History  Substance Use Topics  . Smoking status: Never Smoker   . Smokeless tobacco: Never Used  . Alcohol Use: No    Current Outpatient Prescriptions    Medication Sig Dispense Refill  . acetaminophen (TYLENOL) 500 MG tablet Take 500 mg by mouth every 6 (six) hours as needed for mild pain.    Marland Kitchen atorvastatin (LIPITOR) 40 MG tablet Take 40 mg by mouth daily.    Marland Kitchen lisinopril (PRINIVIL,ZESTRIL) 10 MG tablet Take 10 mg by mouth daily.    . ranitidine (ZANTAC) 150 MG capsule Take 150 mg by mouth daily.      No current facility-administered medications for this visit.    No Known Allergies  Review of Systems  Constitutional: Positive for activity change and fatigue. Negative for fever, chills and unexpected weight change.  HENT:       Last saw his dentist last month and was told that he had a fractured tooth that is causing him some pain with cold or sweets. He needs a crown. Also told that he has cavities under two crowns and a bridge.  Eyes: Negative.   Respiratory: Positive for shortness of breath.        With exertion  Cardiovascular: Positive for palpitations. Negative for chest pain and leg swelling.  Gastrointestinal:       Reflux  Endocrine: Negative.   Genitourinary: Positive for frequency.  Musculoskeletal: Positive for arthralgias.  Skin: Negative.   Allergic/Immunologic: Negative.   Neurological: Positive for dizziness.  Hematological:  History of blood in stool when taking aspirin 325 mg. Resolved after stopping aspirin.  Psychiatric/Behavioral: Negative.     BP 129/74 mmHg  Pulse 105  Resp 18  Ht 6' (1.829 m)  Wt 209 lb (94.802 kg)  BMI 28.34 kg/m2  SpO2 96% Physical Exam  Constitutional: He is oriented to person, place, and time. He appears well-developed and well-nourished. No distress.  HENT:  Head: Normocephalic and atraumatic.  Mouth/Throat: Oropharynx is clear and moist.  Eyes: EOM are normal. Pupils are equal, round, and reactive to light.  Neck: Normal range of motion. Neck supple. No JVD present. No thyromegaly present.  Cardiovascular: Normal rate, regular rhythm and intact distal pulses.    Murmur heard. 3/6 harsh systolic murmur along RSB  Pulmonary/Chest: Effort normal and breath sounds normal. No respiratory distress. He exhibits no tenderness.  Abdominal: Soft. Bowel sounds are normal. He exhibits no distension and no mass. There is no tenderness.  Musculoskeletal: Normal range of motion. He exhibits no edema.  Lymphadenopathy:    He has no cervical adenopathy.  Neurological: He is alert and oriented to person, place, and time. He has normal strength. No cranial nerve deficit or sensory deficit.  Skin: Skin is warm and dry.  Psychiatric: He has a normal mood and affect.     Diagnostic Tests:     [name]            [address], [place] [address]              [phone]  ------------------------------------------------------------------- Transthoracic Echocardiography  Patient:  Zachary, [name] MR #:    [id number] Study Date: 02/10/2015 Gender:   M Age:    33 Height:   180.3 cm Weight:   90.3 kg BSA:    2.14 m^2 Pt. Status: Room:  Ivar Bury, MD REFERRING  Zachary Crome, MD SONOGRAPHER Zachary [name], Will ATTENDING  Loralie Champagne, M.D. PERFORMING  [facility], Outpatient  cc:  ------------------------------------------------------------------- LV EF: 60% -  65%  ------------------------------------------------------------------- Indications:   (I35.9).  ------------------------------------------------------------------- History:  PMH: Acquired from the patient and from the patient&'s chart. Aortic stenosis. Risk factors: Hypertension. Dyslipidemia.  ------------------------------------------------------------------- Study Conclusions  - Left ventricle: The cavity size was normal. Wall thickness was increased in a pattern of moderate LVH. Systolic function was normal. The estimated ejection fraction was in the range of  60% to 65%. Wall motion was normal; there were no regional wall motion abnormalities. Doppler parameters are consistent with abnormal left ventricular relaxation (grade 1 diastolic dysfunction). - Aortic valve: Trileaflet; severely calcified leaflets. There was severe stenosis. There was mild regurgitation. Mean gradient (S): 49 mm Hg. Valve area (VTI): 1.08 cm^2. - Aorta: Mildly dilated aortic root and ascending aorta. Aortic root dimension: 39 mm (ED). Ascending aortic diameter: 42 mm (S). - Mitral valve: There is a hockey stick appearance to the mitral valve suggestive of rheumatic valve disease. Mildly thickened, mildly calcified leaflets . I suspect mild mitral stenosis. There was no significant regurgitation. Peak gradient (D): 9 mm Hg. Valve area by pressure half-time: 2.48 cm^2. - Left atrium: The atrium was mildly dilated. - Right ventricle: The cavity size was normal. Systolic function was normal. - Pulmonary arteries: No complete TR doppler jet so unable to estimate PA systolic pressure. - Inferior vena cava: The vessel was normal in size. The respirophasic diameter changes were in the normal range (>=  50%), consistent with normal central venous pressure.  Impressions:  - Normal LV size with moderate LV hypertrophy. EF 60-65%. Aortic stenosis with mean gradient 49 mmHg and AVA 1.08 cm^2 (calculates to larger than expected valve area due to large LVOT diameter). I suspect that aortic stenosis is severe. There is a thickened, rheumatic-appearing mitral valve. The valve was not fully interrogated, but suspect mitral stenosis is no more than mild. Normal RV size and systolic function.  Transthoracic echocardiography. M-mode, complete 2D, spectral Doppler, and color Doppler. Birthdate: Patient birthdate: [birth year]. Age: Patient is 69 yr old. Sex: Gender: male. BMI: 27.8 kg/m^2. Blood pressure:   122/80 Patient  status: Outpatient. Study date: Study date: 02/10/2015. Study time: 09:41 AM. Location: [facility]  -------------------------------------------------------------------  ------------------------------------------------------------------- Left ventricle: The cavity size was normal. Wall thickness was increased in a pattern of moderate LVH. Systolic function was normal. The estimated ejection fraction was in the range of 60% to 65%. Wall motion was normal; there were no regional wall motion abnormalities. Doppler parameters are consistent with abnormal left ventricular relaxation (grade 1 diastolic dysfunction).  ------------------------------------------------------------------- Aortic valve:  Trileaflet; severely calcified leaflets. Doppler: There was severe stenosis.  There was mild regurgitation.  VTI ratio of LVOT to aortic valve: 0.24. Valve area (VTI): 1.08 cm^2. Indexed valve area (VTI): 0.54 cm^2/m^2. Peak velocity ratio of LVOT to aortic valve: 0.23. Valve area (Vmax): 1.11 cm^2. Indexed valve area (Vmax): 0.52 cm^2/m^2. Mean velocity ratio of LVOT to aortic valve: 0.22. Valve area (Vmean): 1.09 cm^2. Indexed valve area (Vmean): 0.51 cm^2/m^2.  Mean gradient (S): 49 mm Hg. Peak gradient (S): 80 mm Hg.  ------------------------------------------------------------------- Aorta: Mildly dilated aortic root and ascending aorta.  ------------------------------------------------------------------- Mitral valve: There is a hockey stick appearance to the mitral valve suggestive of rheumatic valve disease. Mildly thickened, mildly calcified leaflets . I suspect mild mitral stenosis. Doppler: There was no significant regurgitation.  Valve area by pressure half-time: 2.48 cm^2. Indexed valve area by pressure half-time: 1.16 cm^2/m^2.  Peak gradient (D): 9 mm Hg.  ------------------------------------------------------------------- Left atrium: The atrium  was mildly dilated.  ------------------------------------------------------------------- Right ventricle: The cavity size was normal. Systolic function was normal.  ------------------------------------------------------------------- Pulmonic valve:  Structurally normal valve.  Cusp separation was normal. Doppler: Transvalvular velocity was within the normal range. There was no regurgitation.  ------------------------------------------------------------------- Tricuspid valve:  Doppler: There was no significant regurgitation.  ------------------------------------------------------------------- Pulmonary artery:  No complete TR doppler jet so unable to estimate PA systolic pressure.  ------------------------------------------------------------------- Right atrium: The atrium was normal in size.  ------------------------------------------------------------------- Pericardium: There was no pericardial effusion.  ------------------------------------------------------------------- Systemic veins: Inferior vena cava: The vessel was normal in size. The respirophasic diameter changes were in the normal range (>= 50%), consistent with normal central venous pressure.  ------------------------------------------------------------------- Post procedure conclusions Ascending Aorta:  - Mildly dilated aortic root and ascending aorta.  ------------------------------------------------------------------- Measurements  Left ventricle              Value      Reference LV ID, ED, PLAX chordal         45.7  mm    43 - 52 LV ID, ES, PLAX chordal         26.2  mm    23 - 38 LV fx shortening, PLAX chordal      43   %    >=29 LV PW thickness, ED           14.3  mm    --------- IVS/LV  PW ratio, ED           1.27      <=1.3 Stroke volume, 2D            108  ml     --------- Stroke volume/bsa, 2D          50   ml/m^2  --------- LV e&', lateral              6.53  cm/s   --------- LV E/e&', lateral             22.62      --------- LV e&', medial              5.36  cm/s   --------- LV E/e&', medial             27.55      --------- LV e&', average              5.95  cm/s   --------- LV E/e&', average             24.84      ---------  Ventricular septum            Value      Reference IVS thickness, ED            18.2  mm    ---------  LVOT                   Value      Reference LVOT ID, S                25   mm    --------- LVOT area                4.91  cm^2   --------- LVOT ID                 25   mm    --------- LVOT peak velocity, S          101  cm/s   --------- LVOT mean velocity, S          70.1  cm/s   --------- LVOT VTI, S               22   cm    --------- LVOT peak gradient, S          4   mm Hg  --------- Stroke volume (SV), LVOT DP       108  ml    --------- Stroke index (SV/bsa), LVOT DP      50.4  ml/m^2  ---------  Aortic valve               Value      Reference Aortic valve peak velocity, S      448  cm/s   --------- Aortic valve mean velocity, S      317  cm/s   --------- Aortic valve VTI, S           93.3  cm    --------- Aortic mean gradient, S         46   mm Hg  --------- Aortic peak gradient, S         80   mm Hg  --------- VTI ratio, LVOT/AV            0.24      --------- Aortic valve area, VTI  1.16  cm^2   --------- Aortic valve area/bsa, VTI        0.54  cm^2/m^2  --------- Velocity ratio, peak, LVOT/AV      0.23      --------- Aortic valve area, peak velocity     1.11  cm^2   --------- Aortic valve area/bsa, peak       0.52  cm^2/m^2 --------- velocity Velocity ratio, mean, LVOT/AV      0.22      --------- Aortic valve area, mean velocity     1.09  cm^2   --------- Aortic valve area/bsa, mean       0.51  cm^2/m^2 --------- velocity Aortic regurg pressure half-time     294  ms    ---------  Aorta                  Value      Reference Aortic root ID, ED            46   mm    --------- Ascending aorta ID, A-P, S        42   mm    ---------  Left atrium               Value      Reference LA ID, A-P, ES              45   mm    --------- LA ID/bsa, A-P              2.1  cm/m^2  <=2.2 LA volume, S               71   ml    --------- LA volume/bsa, S             33.1  ml/m^2  --------- LA volume, ES, 1-p A4C          65   ml    --------- LA volume/bsa, ES, 1-p A4C        30.3  ml/m^2  --------- LA volume, ES, 1-p A2C          75   ml    --------- LA volume/bsa, ES, 1-p A2C        35   ml/m^2  ---------  Mitral valve               Value      Reference Mitral E-wave peak velocity       147.68 cm/s   --------- Mitral A-wave peak velocity       185  cm/s   --------- Mitral deceleration slope        482.1 cm/s^2  --------- Mitral deceleration time     (H)   306  ms    150 - 230 Mitral pressure half-time        89   ms    --------- Mitral peak gradient, D         9   mm Hg  --------- Mitral E/A ratio, peak          0.8       --------- Mitral valve area, PHT, DP        2.48   cm^2   --------- Mitral valve area/bsa, PHT, DP      1.16  cm^2/m^2 ---------  Systemic veins              Value      Reference Estimated CVP  3   mm Hg  ---------  Right ventricle             Value      Reference RV s&', lateral, S            13.2  cm/s   ---------  Legend: (L) and (H) mark values outside specified reference range.  ------------------------------------------------------------------- Prepared and Electronically Authenticated by  Loralie Champagne, M.D. 2016-10-19T16:03:42     Zachary Crome, MD (Primary)      Procedures    Intravascular Pressure Wire/FFR Study   Right/Left Heart Cath and Coronary Angiography    Conclusion    1. Prox RCA lesion, 50% stenosed. 2. 1st Mrg lesion, 95% stenosed. 3. Prox Cx to Dist Cx lesion, 25% stenosed. 4. Prox LAD to Mid LAD lesion, 65% stenosed. 5. Dist LAD lesion, 50% stenosed.   Severe aortic stenosis with papillated area valve area of 0.65 cm  Heavy aortic valve calcification and peak to peak aortic valve gradient of 45 mmHg with a mean gradient of 40 mmHg  Normal right heart pressures  Normal left ventricular systolic function and hemodynamics  Coronary artery disease with 50% proximal RCA 95% branch of the first marginal. The vessel is too small to bypass and I did not feel percutaneous intervention was needed in absence of symptoms. Intermediate stenosis in the proximal to mid LAD with an FFR of 0.86 documenting that the lesion is not hemodynamically significant. There is also 50% distal LAD narrowing. The first septal perforator contains 90% ostial narrowing.    RECOMMENDATIONS:   Heart valve team approach for consideration of aortic valve replacement. He is low risk for surgery and therefore I assume that he will have open surgical valve replacement.  I don't believe he needs coronary grafting as his  coronary lesions are not critical.     Indications    Coronary artery disease involving native coronary artery of native heart with angina pectoris (HCC) [I25.119 (ICD-10-CM)]   Aortic stenosis [I35.0 (ICD-10-CM)]    Technique and Indications    The right heart cath was performed via the right antecubital vein. An 18-gauge Angiocath started in the holding area was exchanged for a 5 French brachial sheath using a modified Seldinger technique. Right heart pressures were then recorded using a 5 French balloon tip catheter. A main pulmonary artery oximetry sample was obtained. A pulmonary capillary wedge was recorded and documented to be normal.. The right heart catheter was then removed by pullback while observing pressures.  The right radial area was sterilely prepped and draped. Intravenous sedation with Versed and fentanyl was administered. 1% Xylocaine was infiltrated to achieve local analgesia. A double wall stick with an Angiocath was utilized to obtain intra-arterial access. The modified Seldinger technique was used to place a 76F " Slender" sheath in the right radial artery. Coronary angiography was done using 5 F catheters. Right coronary angiography was performed with a JR4. We then used a straight tipped 0.035 cm wire to cross into the left ventricle using the JR4 diagnostic catheter. Left ventriculography was done using the JR 4 catheter and hand injection. Left coronary angiography was performed with a JL 5.0 cm.   We noted tight disease in a small branch of the first obtuse marginal, 40-50% proximal RCA, and 50-65% proximal to mid LAD. The LAD was of questionable angiographic significance. Therefore physiologic/hemodynamic testing was felt necessary.  FFR was performed using a 6 [place] XB LAD guide catheter. Adequate heparinization (a total of 10,000 units)  lead to ACT greater than 250 seconds. We then performed FFR using adenosine pharmacologic stress using the Comet pressure wire.  The most FFR was 0.86, making the LAD lesion not hemodynamically significant.  A wrist and was used for right radial hemostasis. The venous sheath will be pulled later when the ACT is less than 200 seconds. Plan is for the patient to be discharged later today.Estimated blood loss <50 mL. There were no immediate complications during the procedure.    Coronary Findings    Dominance: Right   Left Anterior Descending   . Prox LAD to Mid LAD lesion, 65% stenosed. Tubular.   Jorene Minors LAD lesion, 50% stenosed. The lesion is type non-C.   Marland Kitchen First Diagonal Branch   The vessel is small in size.     Left Circumflex   . Prox Cx to Dist Cx lesion, 25% stenosed.   . First Obtuse Marginal Branch   The vessel is small in size.   . 1st Mrg lesion, 95% stenosed. The lesion is type non-C Tubular.     Right Coronary Artery   . Prox RCA lesion, 50% stenosed. The lesion is type C Diffuse tubular eccentric.       Right Heart Pressures LV EDP is normal.    Wall Motion                 Left Heart    Left Ventricle The left ventricular ejection fraction is 55-65% by visual estimate.   Aortic Valve There is severe aortic valve stenosis. The aortic valve is calcified. There is restricted aortic valve motion.    Coronary Diagrams    Diagnostic Diagram            Implants    Name ID Temporary Type Supply   No information to display    PACS Images    Show images for Cardiac catheterization     Link to Procedure Log    Procedure Log      Hemo Data       Most Recent Value   Fick Cardiac Output  4.46 L/min   Fick Cardiac Output Index  2.06 (L/min)/BSA   Aortic Mean Gradient  39.7 mmHg   Aortic Peak Gradient  45 mmHg   Aortic Valve Area  0.65   Aortic Value Area Index  0.3 cm2/BSA   RA A Wave  6 mmHg   RA V Wave  7 mmHg   RA Mean  8 mmHg   RV Systolic Pressure  28 mmHg   RV Diastolic Pressure  3 mmHg   RV EDP  6 mmHg   PA Systolic Pressure  31  mmHg   PA Diastolic Pressure  11 mmHg   PA Mean  22 mmHg   PW A Wave  18 mmHg   PW V Wave  10 mmHg   PW Mean  11 mmHg   AO Systolic Pressure  91 mmHg   AO Diastolic Pressure  55 mmHg   AO Mean  72 mmHg   LV Systolic Pressure  [id number] mmHg   LV Diastolic Pressure  6 mmHg   LV EDP  9 mmHg   Arterial Occlusion Pressure Extended Systolic Pressure  90 mmHg   Arterial Occlusion Pressure Extended Diastolic Pressure  52 mmHg   Arterial Occlusion Pressure Extended Mean Pressure  69 mmHg   Left Ventricular Apex Extended Systolic Pressure  [id number] mmHg   Left Ventricular Apex Extended Diastolic Pressure  1 mmHg   Left Ventricular  Apex Extended EDP Pressure  7 mmHg   QP/QS  1   TPVR Index  10.71 HRUI   TSVR Index  35.05 HRUI   PVR SVR Ratio  0.17   TPVR/TSVR Ratio  0.31     Impression:  He has stage D severe, symptomatic aortic stenosis with progressive exertional fatigue, shortness of breath and dizziness. I have personally reviewed his echo and cath. He has a tri-leaflet aortic valve with severely calcified and poorly mobile leaflets. There is mild aortic root and ascending aortic dilation but I don't think it is large enough to warrant replacement. The mitral valve leaflets are mildly calcified and thickened but the peak gradient is only 9 mm Hg and the valve leaflets appear to move well with a valve area of 2.48 cm2. His cath shows non-obstructive stenoses in the proximal RCA and proximal to mid LAD that is insignificant by FFR measured at 0.86. I agree with Dr. Tamala [name] that CABG is not needed and these lesions would be amenable to PCI in the future if they progressed. I have recommended a tissue valve given his age of 48 and history of some GI blood loss on aspirin. He is going to see his dentist this week to discuss if he needs any dental work prior to his surgery. I discussed the operative procedure with the patient and his wife including alternatives, benefits and risks; including  but not limited to bleeding, blood transfusion, infection, stroke, myocardial infarction, graft failure, heart block requiring a permanent pacemaker, organ dysfunction, and death.  Zachary [name] understands and agrees to proceed.    Plan:  AVR using a tissue valve on Thursday 03/25/2015. Zachary Pollack, MD [facility] [phone]

## 2015-03-23 NOTE — Pre-Procedure Instructions (Signed)
    Zachary [name]  03/23/2015      [facility] - [place], [address] [address] [place] [address] Phone: [phone] Fax: [phone]  [place], [address] [place] [address] Phone: [phone] Fax: [phone]    Your procedure is scheduled on 03-25-2015  Thursday   Report to [facility] Admitting at 5:30A.M.   Call this number if you have problems the morning of surgery:  [phone]   Remember:  Do not eat food or drink liquids after midnight.   Take these medicines the morning of surgery with A SIP OF WATER Lipitor,Zantac   Do not wear jewelry  Do not wear lotions, powders, or perfumes.  You may not wear deodorant.  Do not shave 48 hours prior to surgery.  Men may shave face and neck.   Do not bring valuables to the hospital.  [facility] is not responsible for any belongings or valuables.  Contacts, dentures or bridgework may not be worn into surgery.  Leave your suitcase in the car.  After surgery it may be brought to your room.  For patients admitted to the hospital, discharge time will be determined by your treatment team.      Special instructions:  See attached Sheet for instructions on CHG  Showers  Please read over the following fact sheets that you were given. Pain Booklet, Coughing and Deep Breathing, Blood Transfusion Information, Open Heart Packet and Surgical Site Infection Prevention

## 2015-03-24 ENCOUNTER — Ambulatory Visit (HOSPITAL_COMMUNITY)
Admission: RE | Admit: 2015-03-24 | Discharge: 2015-03-24 | Disposition: A | Discharge status: Home / Self Care | Payer: Medicare Other | Source: Ambulatory Visit | Attending: Surgery | Admitting: Surgery

## 2015-03-24 ENCOUNTER — Encounter (HOSPITAL_COMMUNITY)
Admission: RE | Admit: 2015-03-24 | Discharge: 2015-03-24 | Disposition: A | Discharge status: Home / Self Care | Payer: Medicare Other | Source: Ambulatory Visit | Attending: Surgery | Admitting: Surgery

## 2015-03-24 ENCOUNTER — Ambulatory Visit (HOSPITAL_BASED_OUTPATIENT_CLINIC_OR_DEPARTMENT_OTHER)
Admission: RE | Admit: 2015-03-24 | Discharge: 2015-03-24 | Disposition: A | Discharge status: Home / Self Care | Payer: Medicare Other | Source: Ambulatory Visit | Attending: Surgery | Admitting: Surgery

## 2015-03-24 ENCOUNTER — Encounter (HOSPITAL_COMMUNITY): Payer: Self-pay

## 2015-03-24 VITALS — BP 101/62 | HR 110 | Temp 98.2°F | Resp 18 | Ht 71.5 in | Wt 207.1 lb

## 2015-03-24 DIAGNOSIS — I350 Nonrheumatic aortic (valve) stenosis: Secondary | ICD-10-CM | POA: Diagnosis not present

## 2015-03-24 DIAGNOSIS — Z01810 Encounter for preprocedural cardiovascular examination: Secondary | ICD-10-CM

## 2015-03-24 DIAGNOSIS — R918 Other nonspecific abnormal finding of lung field: Secondary | ICD-10-CM

## 2015-03-24 DIAGNOSIS — I517 Cardiomegaly: Secondary | ICD-10-CM

## 2015-03-24 DIAGNOSIS — Z01812 Encounter for preprocedural laboratory examination: Secondary | ICD-10-CM | POA: Insufficient documentation

## 2015-03-24 DIAGNOSIS — Z01818 Encounter for other preprocedural examination: Secondary | ICD-10-CM | POA: Insufficient documentation

## 2015-03-24 DIAGNOSIS — I6523 Occlusion and stenosis of bilateral carotid arteries: Secondary | ICD-10-CM

## 2015-03-24 DIAGNOSIS — I10 Essential (primary) hypertension: Secondary | ICD-10-CM | POA: Insufficient documentation

## 2015-03-24 HISTORY — DX: Frequency of micturition: R35.0

## 2015-03-24 HISTORY — DX: Unspecified osteoarthritis, unspecified site: M19.90

## 2015-03-24 LAB — COMPREHENSIVE METABOLIC PANEL
ALT: 24 U/L (ref 17–63)
ANION GAP: 8 (ref 5–15)
AST: 25 U/L (ref 15–41)
Albumin: 3.9 g/dL (ref 3.5–5.0)
Alkaline Phosphatase: 68 U/L (ref 38–126)
BILIRUBIN TOTAL: 0.6 mg/dL (ref 0.3–1.2)
BUN: 18 mg/dL (ref 6–20)
CO2: 23 mmol/L (ref 22–32)
Calcium: 9.5 mg/dL (ref 8.9–10.3)
Chloride: 107 mmol/L (ref 101–111)
Creatinine, Ser: 1.49 mg/dL — ABNORMAL HIGH (ref 0.61–1.24)
GFR calc Af Amer: 53 mL/min — ABNORMAL LOW (ref >60)
GFR, EST NON AFRICAN AMERICAN: 46 mL/min — AB (ref >60)
Glucose, Bld: 118 mg/dL — ABNORMAL HIGH (ref 65–99)
POTASSIUM: 4.2 mmol/L (ref 3.5–5.1)
Sodium: 138 mmol/L (ref 135–145)
TOTAL PROTEIN: 6.6 g/dL (ref 6.5–8.1)

## 2015-03-24 LAB — PROTIME-INR
INR: 1.04 (ref 0.00–1.49)
PROTHROMBIN TIME: 13.8 s (ref 11.6–15.2)

## 2015-03-24 LAB — SURGICAL PCR SCREEN
MRSA, PCR: NEGATIVE
STAPHYLOCOCCUS AUREUS: POSITIVE — AB

## 2015-03-24 LAB — PULMONARY FUNCTION TEST
DL/VA % PRED: 99 %
DL/VA: 4.65 ml/min/mmHg/L
DLCO unc % pred: 53 %
DLCO unc: 18.06 ml/min/mmHg
FEF 25-75 POST: 4.24 L/s
FEF 25-75 Pre: 4.37 L/sec
FEF2575-%CHANGE-POST: -2 %
FEF2575-%PRED-PRE: 167 %
FEF2575-%Pred-Post: 163 %
FEV1-%CHANGE-POST: -1 %
FEV1-%PRED-PRE: 97 %
FEV1-%Pred-Post: 96 %
FEV1-PRE: 3.33 L
FEV1-Post: 3.29 L
FEV1FVC-%CHANGE-POST: 4 %
FEV1FVC-%PRED-PRE: 115 %
FEV6-%Change-Post: -7 %
FEV6-%PRED-PRE: 88 %
FEV6-%Pred-Post: 81 %
FEV6-POST: 3.56 L
FEV6-Pre: 3.86 L
FEV6FVC-%Change-Post: 0 %
FEV6FVC-%Pred-Post: 105 %
FEV6FVC-%Pred-Pre: 104 %
FVC-%CHANGE-POST: -5 %
FVC-%PRED-PRE: 84 %
FVC-%Pred-Post: 79 %
FVC-POST: 3.68 L
FVC-PRE: 3.88 L
POST FEV6/FVC RATIO: 100 %
PRE FEV1/FVC RATIO: 86 %
PRE FEV6/FVC RATIO: 99 %
Post FEV1/FVC ratio: 89 %
RV % pred: 25 %
RV: 0.63 L
TLC % pred: 58 %
TLC: 4.21 L

## 2015-03-24 LAB — CBC
HEMATOCRIT: 44.6 % (ref 39.0–52.0)
Hemoglobin: 14.8 g/dL (ref 13.0–17.0)
MCH: 32.7 pg (ref 26.0–34.0)
MCHC: 33.2 g/dL (ref 30.0–36.0)
MCV: 98.5 fL (ref 78.0–100.0)
Platelets: 229 10*3/uL (ref 150–400)
RBC: 4.53 MIL/uL (ref 4.22–5.81)
RDW: 12.8 % (ref 11.5–15.5)
WBC: 8 10*3/uL (ref 4.0–10.5)

## 2015-03-24 LAB — URINALYSIS, ROUTINE W REFLEX MICROSCOPIC
BILIRUBIN URINE: NEGATIVE
Glucose, UA: NEGATIVE mg/dL
Hgb urine dipstick: NEGATIVE
Ketones, ur: NEGATIVE mg/dL
NITRITE: NEGATIVE
PH: 5.5 (ref 5.0–8.0)
Protein, ur: NEGATIVE mg/dL
SPECIFIC GRAVITY, URINE: 1.022 (ref 1.005–1.030)

## 2015-03-24 LAB — BLOOD GAS, ARTERIAL
Acid-base deficit: 0.4 mmol/L (ref 0.0–2.0)
BICARBONATE: 23.5 meq/L (ref 20.0–24.0)
Drawn by: 206361
FIO2: 0.21
O2 Saturation: 93.8 %
PCO2 ART: 36.4 mmHg (ref 35.0–45.0)
PH ART: 7.425 (ref 7.350–7.450)
Patient temperature: 98.6
TCO2: 24.6 mmol/L (ref 0–100)
pO2, Arterial: 68.4 mmHg — ABNORMAL LOW (ref 80.0–100.0)

## 2015-03-24 LAB — URINE MICROSCOPIC-ADD ON

## 2015-03-24 LAB — ABO/RH: ABO/RH(D): O NEG

## 2015-03-24 LAB — TYPE AND SCREEN
ABO/RH(D): O NEG
ANTIBODY SCREEN: NEGATIVE

## 2015-03-24 LAB — APTT: aPTT: 29 seconds (ref 24–37)

## 2015-03-24 MED ORDER — INSULIN REGULAR HUMAN 100 UNIT/ML IJ SOLN
INTRAMUSCULAR | Status: AC
  Administered 2015-03-25: 1 [IU]/h via INTRAVENOUS
  Filled 2015-03-24: qty 2.5

## 2015-03-24 MED ORDER — VANCOMYCIN HCL 10 G IV SOLR
1500.0000 mg | INTRAVENOUS | Status: AC
  Administered 2015-03-25: 1500 mg via INTRAVENOUS
  Filled 2015-03-24: qty 1500

## 2015-03-24 MED ORDER — EPINEPHRINE HCL 1 MG/ML IJ SOLN
0.0000 ug/min | INTRAVENOUS | Status: DC
  Filled 2015-03-24: qty 4

## 2015-03-24 MED ORDER — SODIUM CHLORIDE 0.9 % IV SOLN
INTRAVENOUS | Status: DC
  Filled 2015-03-24: qty 30

## 2015-03-24 MED ORDER — DOPAMINE-DEXTROSE 3.2-5 MG/ML-% IV SOLN
0.0000 ug/kg/min | INTRAVENOUS | Status: DC
  Filled 2015-03-24: qty 250

## 2015-03-24 MED ORDER — METOPROLOL TARTRATE 12.5 MG HALF TABLET
12.5000 mg | ORAL_TABLET | Freq: Once | ORAL | Status: AC
  Administered 2015-03-25: 12.5 mg via ORAL
  Filled 2015-03-24: qty 1

## 2015-03-24 MED ORDER — PLASMA-LYTE 148 IV SOLN
INTRAVENOUS | Status: DC
  Filled 2015-03-24: qty 2.5

## 2015-03-24 MED ORDER — NITROGLYCERIN IN D5W 200-5 MCG/ML-% IV SOLN
2.0000 ug/min | INTRAVENOUS | Status: AC
  Administered 2015-03-25: 33 ug/min via INTRAVENOUS
  Filled 2015-03-24: qty 250

## 2015-03-24 MED ORDER — MAGNESIUM SULFATE 50 % IJ SOLN
40.0000 meq | INTRAMUSCULAR | Status: DC
Start: 2015-03-25 — End: 2015-03-25
  Filled 2015-03-24: qty 10

## 2015-03-24 MED ORDER — DEXTROSE 5 % IV SOLN
750.0000 mg | INTRAVENOUS | Status: DC
  Filled 2015-03-24: qty 750

## 2015-03-24 MED ORDER — ALBUTEROL SULFATE (2.5 MG/3ML) 0.083% IN NEBU
2.5000 mg | INHALATION_SOLUTION | Freq: Once | RESPIRATORY_TRACT | Status: AC
  Administered 2015-03-24: 2.5 mg via RESPIRATORY_TRACT

## 2015-03-24 MED ORDER — CHLORHEXIDINE GLUCONATE 0.12 % MT SOLN
15.0000 mL | Freq: Once | OROMUCOSAL | Status: AC
  Administered 2015-03-25: 15 mL via OROMUCOSAL
  Filled 2015-03-24 (×2): qty 15

## 2015-03-24 MED ORDER — CHLORHEXIDINE GLUCONATE 4 % EX LIQD: 30.0000 mL | CUTANEOUS | Status: DC

## 2015-03-24 MED ORDER — POTASSIUM CHLORIDE 2 MEQ/ML IV SOLN
80.0000 meq | INTRAVENOUS | Status: DC
  Filled 2015-03-24: qty 40

## 2015-03-24 MED ORDER — DEXMEDETOMIDINE HCL IN NACL 400 MCG/100ML IV SOLN
0.1000 ug/kg/h | INTRAVENOUS | Status: AC
Start: 2015-03-25 — End: 2015-03-25
  Administered 2015-03-25: .2 ug/kg/h via INTRAVENOUS
  Filled 2015-03-24: qty 100

## 2015-03-24 MED ORDER — PHENYLEPHRINE HCL 10 MG/ML IJ SOLN
30.0000 ug/min | INTRAVENOUS | Status: AC
  Administered 2015-03-25: 35 ug/min via INTRAVENOUS
  Filled 2015-03-24: qty 2

## 2015-03-24 MED ORDER — DEXTROSE 5 % IV SOLN
1.5000 g | INTRAVENOUS | Status: AC
  Administered 2015-03-25: .75 g via INTRAVENOUS
  Administered 2015-03-25: 1.5 g via INTRAVENOUS
  Filled 2015-03-24: qty 1.5

## 2015-03-24 MED ORDER — SODIUM CHLORIDE 0.9 % IV SOLN
INTRAVENOUS | Status: AC
  Administered 2015-03-25: 69.8 mL/h via INTRAVENOUS
  Filled 2015-03-24: qty 40

## 2015-03-24 NOTE — Progress Notes (Signed)
VASCULAR LAB PRELIMINARY  PRELIMINARY  PRELIMINARY  PRELIMINARY  Pre-op Cardiac Surgery  Carotid Findings:  Bilateral:  1-39% ICA stenosis.  Vertebral artery flow is antegrade.     Upper Extremity Right Left  Brachial Pressures 112 Triphasic 99 Triphasic  Radial Waveforms Triphasic Triphasic  Ulnar Waveforms Triphasic Triphasic  Palmar Arch (Allen's Test) Abnormal Abnormal   Findings:  Doppler waveforms remained normal with radial compression and obliterated with ulnar compressions bilaterally.    Shalisha Clausing, RVS 03/24/2015, 1:22 PM

## 2015-03-25 ENCOUNTER — Inpatient Hospital Stay (HOSPITAL_COMMUNITY)
Admission: RE | Admit: 2015-03-25 | Discharge: 2015-03-30 | DRG: 220 | Disposition: A | Discharge status: Home / Self Care | Payer: Medicare Other | Source: Ambulatory Visit | Attending: Surgery | Admitting: Surgery

## 2015-03-25 ENCOUNTER — Other Ambulatory Visit: Payer: Self-pay

## 2015-03-25 ENCOUNTER — Inpatient Hospital Stay (HOSPITAL_COMMUNITY)
Admission: RE | Admit: 2015-03-25 | Discharge: 2015-03-25 | Disposition: A | Discharge status: Home / Self Care | Payer: Medicare Other | Source: Ambulatory Visit | Attending: Surgery | Admitting: Surgery

## 2015-03-25 ENCOUNTER — Inpatient Hospital Stay (HOSPITAL_COMMUNITY): Payer: Medicare Other | Admitting: Certified Registered"

## 2015-03-25 ENCOUNTER — Encounter (HOSPITAL_COMMUNITY)
Admission: RE | Disposition: A | Discharge status: Home / Self Care | Payer: Medicare Other | Source: Ambulatory Visit | Attending: Surgery

## 2015-03-25 ENCOUNTER — Inpatient Hospital Stay (HOSPITAL_COMMUNITY): Payer: Medicare Other

## 2015-03-25 ENCOUNTER — Encounter (HOSPITAL_COMMUNITY): Payer: Self-pay | Admitting: General Practice

## 2015-03-25 DIAGNOSIS — D62 Acute posthemorrhagic anemia: Secondary | ICD-10-CM | POA: Diagnosis not present

## 2015-03-25 DIAGNOSIS — I4892 Unspecified atrial flutter: Secondary | ICD-10-CM | POA: Diagnosis not present

## 2015-03-25 DIAGNOSIS — Z8249 Family history of ischemic heart disease and other diseases of the circulatory system: Secondary | ICD-10-CM

## 2015-03-25 DIAGNOSIS — E7800 Pure hypercholesterolemia, unspecified: Secondary | ICD-10-CM | POA: Diagnosis present

## 2015-03-25 DIAGNOSIS — I2510 Atherosclerotic heart disease of native coronary artery without angina pectoris: Secondary | ICD-10-CM | POA: Diagnosis present

## 2015-03-25 DIAGNOSIS — Z952 Presence of prosthetic heart valve: Secondary | ICD-10-CM

## 2015-03-25 DIAGNOSIS — Z0183 Encounter for blood typing: Secondary | ICD-10-CM

## 2015-03-25 DIAGNOSIS — I10 Essential (primary) hypertension: Secondary | ICD-10-CM | POA: Diagnosis present

## 2015-03-25 DIAGNOSIS — Z01810 Encounter for preprocedural cardiovascular examination: Secondary | ICD-10-CM | POA: Diagnosis not present

## 2015-03-25 DIAGNOSIS — Z01811 Encounter for preprocedural respiratory examination: Secondary | ICD-10-CM | POA: Diagnosis not present

## 2015-03-25 DIAGNOSIS — E8770 Fluid overload, unspecified: Secondary | ICD-10-CM | POA: Diagnosis present

## 2015-03-25 DIAGNOSIS — I4891 Unspecified atrial fibrillation: Secondary | ICD-10-CM | POA: Diagnosis not present

## 2015-03-25 DIAGNOSIS — K5900 Constipation, unspecified: Secondary | ICD-10-CM | POA: Diagnosis not present

## 2015-03-25 DIAGNOSIS — Z01812 Encounter for preprocedural laboratory examination: Secondary | ICD-10-CM

## 2015-03-25 DIAGNOSIS — K219 Gastro-esophageal reflux disease without esophagitis: Secondary | ICD-10-CM | POA: Diagnosis present

## 2015-03-25 DIAGNOSIS — J9811 Atelectasis: Secondary | ICD-10-CM | POA: Diagnosis not present

## 2015-03-25 DIAGNOSIS — I350 Nonrheumatic aortic (valve) stenosis: Secondary | ICD-10-CM | POA: Diagnosis present

## 2015-03-25 DIAGNOSIS — J90 Pleural effusion, not elsewhere classified: Secondary | ICD-10-CM

## 2015-03-25 DIAGNOSIS — R0602 Shortness of breath: Secondary | ICD-10-CM | POA: Diagnosis present

## 2015-03-25 HISTORY — PX: TEE WITHOUT CARDIOVERSION: SHX5443

## 2015-03-25 HISTORY — PX: AORTIC VALVE REPLACEMENT: SHX41

## 2015-03-25 LAB — POCT I-STAT, CHEM 8
BUN: 21 mg/dL — ABNORMAL HIGH (ref 6–20)
BUN: 20 mg/dL (ref 6–20)
BUN: 21 mg/dL — ABNORMAL HIGH (ref 6–20)
BUN: 22 mg/dL — ABNORMAL HIGH (ref 6–20)
BUN: 23 mg/dL — ABNORMAL HIGH (ref 6–20)
BUN: 20 mg/dL (ref 6–20)
CALCIUM ION: 1.2 mmol/L (ref 1.13–1.30)
CALCIUM ION: 1.08 mmol/L — AB (ref 1.13–1.30)
CALCIUM ION: 1.11 mmol/L — AB (ref 1.13–1.30)
CALCIUM ION: 1.08 mmol/L — AB (ref 1.13–1.30)
CHLORIDE: 103 mmol/L (ref 101–111)
CHLORIDE: 102 mmol/L (ref 101–111)
CHLORIDE: 100 mmol/L — AB (ref 101–111)
CHLORIDE: 99 mmol/L — AB (ref 101–111)
CHLORIDE: 106 mmol/L (ref 101–111)
CREATININE: 1.3 mg/dL — AB (ref 0.61–1.24)
CREATININE: 1.3 mg/dL — AB (ref 0.61–1.24)
CREATININE: 1.4 mg/dL — AB (ref 0.61–1.24)
Calcium, Ion: 1.23 mmol/L (ref 1.13–1.30)
Calcium, Ion: 1.2 mmol/L (ref 1.13–1.30)
Chloride: 99 mmol/L — ABNORMAL LOW (ref 101–111)
Creatinine, Ser: 1.2 mg/dL (ref 0.61–1.24)
Creatinine, Ser: 1.2 mg/dL (ref 0.61–1.24)
Creatinine, Ser: 1.3 mg/dL — ABNORMAL HIGH (ref 0.61–1.24)
GLUCOSE: 111 mg/dL — AB (ref 65–99)
GLUCOSE: 104 mg/dL — AB (ref 65–99)
GLUCOSE: 127 mg/dL — AB (ref 65–99)
GLUCOSE: 177 mg/dL — AB (ref 65–99)
GLUCOSE: 135 mg/dL — AB (ref 65–99)
Glucose, Bld: 169 mg/dL — ABNORMAL HIGH (ref 65–99)
HCT: 39 % (ref 39.0–52.0)
HCT: 35 % — ABNORMAL LOW (ref 39.0–52.0)
HCT: 28 % — ABNORMAL LOW (ref 39.0–52.0)
HCT: 31 % — ABNORMAL LOW (ref 39.0–52.0)
HCT: 35 % — ABNORMAL LOW (ref 39.0–52.0)
HEMATOCRIT: 31 % — AB (ref 39.0–52.0)
HEMOGLOBIN: 9.5 g/dL — AB (ref 13.0–17.0)
Hemoglobin: 13.3 g/dL (ref 13.0–17.0)
Hemoglobin: 11.9 g/dL — ABNORMAL LOW (ref 13.0–17.0)
Hemoglobin: 10.5 g/dL — ABNORMAL LOW (ref 13.0–17.0)
Hemoglobin: 10.5 g/dL — ABNORMAL LOW (ref 13.0–17.0)
Hemoglobin: 11.9 g/dL — ABNORMAL LOW (ref 13.0–17.0)
POTASSIUM: 4.2 mmol/L (ref 3.5–5.1)
POTASSIUM: 4.3 mmol/L (ref 3.5–5.1)
POTASSIUM: 5.1 mmol/L (ref 3.5–5.1)
POTASSIUM: 4.4 mmol/L (ref 3.5–5.1)
Potassium: 5.4 mmol/L — ABNORMAL HIGH (ref 3.5–5.1)
Potassium: 4.9 mmol/L (ref 3.5–5.1)
SODIUM: 135 mmol/L (ref 135–145)
SODIUM: 134 mmol/L — AB (ref 135–145)
Sodium: 139 mmol/L (ref 135–145)
Sodium: 140 mmol/L (ref 135–145)
Sodium: 135 mmol/L (ref 135–145)
Sodium: 138 mmol/L (ref 135–145)
TCO2: 26 mmol/L (ref 0–100)
TCO2: 26 mmol/L (ref 0–100)
TCO2: 26 mmol/L (ref 0–100)
TCO2: 28 mmol/L (ref 0–100)
TCO2: 24 mmol/L (ref 0–100)
TCO2: 21 mmol/L (ref 0–100)

## 2015-03-25 LAB — CBC
HCT: 35.9 % — ABNORMAL LOW (ref 39.0–52.0)
HEMATOCRIT: 35.9 % — AB (ref 39.0–52.0)
HEMOGLOBIN: 11.8 g/dL — AB (ref 13.0–17.0)
Hemoglobin: 12 g/dL — ABNORMAL LOW (ref 13.0–17.0)
MCH: 32.6 pg (ref 26.0–34.0)
MCH: 32.9 pg (ref 26.0–34.0)
MCHC: 32.9 g/dL (ref 30.0–36.0)
MCHC: 33.4 g/dL (ref 30.0–36.0)
MCV: 99.2 fL (ref 78.0–100.0)
MCV: 98.4 fL (ref 78.0–100.0)
PLATELETS: 164 10*3/uL (ref 150–400)
Platelets: 174 10*3/uL (ref 150–400)
RBC: 3.62 MIL/uL — AB (ref 4.22–5.81)
RBC: 3.65 MIL/uL — ABNORMAL LOW (ref 4.22–5.81)
RDW: 12.8 % (ref 11.5–15.5)
RDW: 12.8 % (ref 11.5–15.5)
WBC: 15.4 10*3/uL — AB (ref 4.0–10.5)
WBC: 14.1 10*3/uL — AB (ref 4.0–10.5)

## 2015-03-25 LAB — POCT I-STAT 3, ART BLOOD GAS (G3+)
Acid-Base Excess: 5 mmol/L — ABNORMAL HIGH (ref 0.0–2.0)
Acid-base deficit: 1 mmol/L (ref 0.0–2.0)
Acid-base deficit: 4 mmol/L — ABNORMAL HIGH (ref 0.0–2.0)
Acid-base deficit: 1 mmol/L (ref 0.0–2.0)
BICARBONATE: 23.9 meq/L (ref 20.0–24.0)
BICARBONATE: 23.6 meq/L (ref 20.0–24.0)
Bicarbonate: 28 mEq/L — ABNORMAL HIGH (ref 20.0–24.0)
Bicarbonate: 25.4 mEq/L — ABNORMAL HIGH (ref 20.0–24.0)
Bicarbonate: 20.8 mEq/L (ref 20.0–24.0)
O2 SAT: 100 %
O2 SAT: 92 %
O2 SAT: 95 %
O2 Saturation: 100 %
O2 Saturation: 91 %
PCO2 ART: 32.1 mmHg — AB (ref 35.0–45.0)
PCO2 ART: 44.4 mmHg (ref 35.0–45.0)
PCO2 ART: 39 mmHg (ref 35.0–45.0)
PCO2 ART: 37.9 mmHg (ref 35.0–45.0)
PCO2 ART: 39.4 mmHg (ref 35.0–45.0)
PH ART: 7.54 — AB (ref 7.350–7.450)
PH ART: 7.367 (ref 7.350–7.450)
PH ART: 7.385 (ref 7.350–7.450)
PO2 ART: 360 mmHg — AB (ref 80.0–100.0)
PO2 ART: 61 mmHg — AB (ref 80.0–100.0)
PO2 ART: 79 mmHg — AB (ref 80.0–100.0)
Patient temperature: 34.5
Patient temperature: 37
Patient temperature: 36.1
TCO2: 29 mmol/L (ref 0–100)
TCO2: 27 mmol/L (ref 0–100)
TCO2: 25 mmol/L (ref 0–100)
TCO2: 22 mmol/L (ref 0–100)
TCO2: 25 mmol/L (ref 0–100)
pH, Arterial: 7.392 (ref 7.350–7.450)
pH, Arterial: 7.346 — ABNORMAL LOW (ref 7.350–7.450)
pO2, Arterial: 323 mmHg — ABNORMAL HIGH (ref 80.0–100.0)
pO2, Arterial: 62 mmHg — ABNORMAL LOW (ref 80.0–100.0)

## 2015-03-25 LAB — PROTIME-INR
INR: 1.31 (ref 0.00–1.49)
PROTHROMBIN TIME: 16.4 s — AB (ref 11.6–15.2)

## 2015-03-25 LAB — POCT I-STAT 3, VENOUS BLOOD GAS (G3P V)
ACID-BASE EXCESS: 12 mmol/L — AB (ref 0.0–2.0)
BICARBONATE: 36.2 meq/L — AB (ref 20.0–24.0)
O2 SAT: 85 %
TCO2: 37 mmol/L (ref 0–100)
pCO2, Ven: 38.9 mmHg — ABNORMAL LOW (ref 45.0–50.0)
pH, Ven: 7.568 — ABNORMAL HIGH (ref 7.250–7.300)
pO2, Ven: 37 mmHg (ref 30.0–45.0)

## 2015-03-25 LAB — PLATELET COUNT: Platelets: 211 10*3/uL (ref 150–400)

## 2015-03-25 LAB — GLUCOSE, CAPILLARY
GLUCOSE-CAPILLARY: 133 mg/dL — AB (ref 65–99)
GLUCOSE-CAPILLARY: 130 mg/dL — AB (ref 65–99)
GLUCOSE-CAPILLARY: 146 mg/dL — AB (ref 65–99)
Glucose-Capillary: 126 mg/dL — ABNORMAL HIGH (ref 65–99)
Glucose-Capillary: 125 mg/dL — ABNORMAL HIGH (ref 65–99)
Glucose-Capillary: 129 mg/dL — ABNORMAL HIGH (ref 65–99)
Glucose-Capillary: 132 mg/dL — ABNORMAL HIGH (ref 65–99)

## 2015-03-25 LAB — CREATININE, SERUM
Creatinine, Ser: 1.48 mg/dL — ABNORMAL HIGH (ref 0.61–1.24)
GFR calc Af Amer: 54 mL/min — ABNORMAL LOW (ref >60)
GFR, EST NON AFRICAN AMERICAN: 47 mL/min — AB (ref >60)

## 2015-03-25 LAB — POCT I-STAT 4, (NA,K, GLUC, HGB,HCT)
Glucose, Bld: 138 mg/dL — ABNORMAL HIGH (ref 65–99)
HCT: 33 % — ABNORMAL LOW (ref 39.0–52.0)
Hemoglobin: 11.2 g/dL — ABNORMAL LOW (ref 13.0–17.0)
POTASSIUM: 4.4 mmol/L (ref 3.5–5.1)
SODIUM: 133 mmol/L — AB (ref 135–145)

## 2015-03-25 LAB — HEMOGLOBIN AND HEMATOCRIT, BLOOD
HCT: 30.9 % — ABNORMAL LOW (ref 39.0–52.0)
Hemoglobin: 10.1 g/dL — ABNORMAL LOW (ref 13.0–17.0)

## 2015-03-25 LAB — HEMOGLOBIN A1C
HEMOGLOBIN A1C: 6.2 % — AB (ref 4.8–5.6)
Mean Plasma Glucose: 131 mg/dL

## 2015-03-25 LAB — MAGNESIUM: Magnesium: 3.1 mg/dL — ABNORMAL HIGH (ref 1.7–2.4)

## 2015-03-25 LAB — APTT: APTT: 39 s — AB (ref 24–37)

## 2015-03-25 SURGERY — REPLACEMENT, AORTIC VALVE, OPEN
Anesthesia: General | Site: Chest

## 2015-03-25 MED ORDER — PROPOFOL 10 MG/ML IV BOLUS
INTRAVENOUS | Status: AC
  Filled 2015-03-25: qty 20

## 2015-03-25 MED ORDER — SODIUM CHLORIDE 0.9 % IJ SOLN
3.0000 mL | Freq: Two times a day (BID) | INTRAMUSCULAR | Status: DC
  Administered 2015-03-26 – 2015-03-27 (×2): 3 mL via INTRAVENOUS

## 2015-03-25 MED ORDER — SODIUM CHLORIDE 0.9 % IJ SOLN
INTRAMUSCULAR | Status: AC
  Filled 2015-03-25: qty 20

## 2015-03-25 MED ORDER — ARTIFICIAL TEARS OP OINT
TOPICAL_OINTMENT | OPHTHALMIC | Status: AC
  Filled 2015-03-25: qty 3.5

## 2015-03-25 MED ORDER — FENTANYL CITRATE (PF) 250 MCG/5ML IJ SOLN
INTRAMUSCULAR | Status: AC
  Filled 2015-03-25: qty 20

## 2015-03-25 MED ORDER — VECURONIUM BROMIDE 10 MG IV SOLR
INTRAVENOUS | Status: DC | PRN
  Administered 2015-03-25 (×2): 5 mg via INTRAVENOUS

## 2015-03-25 MED ORDER — DEXMEDETOMIDINE HCL IN NACL 200 MCG/50ML IV SOLN
0.0000 ug/kg/h | INTRAVENOUS | Status: DC
  Administered 2015-03-25: 0.7 ug/kg/h via INTRAVENOUS

## 2015-03-25 MED ORDER — LACTATED RINGERS IV SOLN
INTRAVENOUS | Status: DC
  Administered 2015-03-25: 13:00:00 via INTRAVENOUS

## 2015-03-25 MED ORDER — ALBUMIN HUMAN 5 % IV SOLN
250.0000 mL | INTRAVENOUS | Status: AC | PRN
  Administered 2015-03-25 (×4): 250 mL via INTRAVENOUS
  Filled 2015-03-25 (×2): qty 250

## 2015-03-25 MED ORDER — MORPHINE SULFATE (PF) 2 MG/ML IV SOLN
2.0000 mg | INTRAVENOUS | Status: DC | PRN
  Administered 2015-03-25: 2 mg via INTRAVENOUS
  Administered 2015-03-26: 4 mg via INTRAVENOUS
  Filled 2015-03-25 (×2): qty 1
  Filled 2015-03-25: qty 2

## 2015-03-25 MED ORDER — MIDAZOLAM HCL 5 MG/ML IJ SOLN
INTRAMUSCULAR | Status: DC | PRN
  Administered 2015-03-25 (×2): 2 mg via INTRAVENOUS
  Administered 2015-03-25: 1 mg via INTRAVENOUS
  Administered 2015-03-25: 5 mg via INTRAVENOUS

## 2015-03-25 MED ORDER — PROPOFOL 10 MG/ML IV BOLUS
INTRAVENOUS | Status: DC | PRN
  Administered 2015-03-25: 80 mg via INTRAVENOUS

## 2015-03-25 MED ORDER — TRAMADOL HCL 50 MG PO TABS
50.0000 mg | ORAL_TABLET | ORAL | Status: DC | PRN
  Administered 2015-03-26 – 2015-03-28 (×4): 100 mg via ORAL
  Filled 2015-03-25 (×5): qty 2

## 2015-03-25 MED ORDER — ROCURONIUM BROMIDE 50 MG/5ML IV SOLN
INTRAVENOUS | Status: AC
  Filled 2015-03-25: qty 2

## 2015-03-25 MED ORDER — ASPIRIN 81 MG PO CHEW
324.0000 mg | CHEWABLE_TABLET | Freq: Every day | ORAL | Status: DC
Start: 2015-03-26 — End: 2015-03-27

## 2015-03-25 MED ORDER — SODIUM CHLORIDE 0.9 % IV SOLN
INTRAVENOUS | Status: DC | PRN
  Administered 2015-03-25: 11:00:00 via INTRAVENOUS

## 2015-03-25 MED ORDER — PROTAMINE SULFATE 10 MG/ML IV SOLN
INTRAVENOUS | Status: DC | PRN
  Administered 2015-03-25: 100 mg via INTRAVENOUS
  Administered 2015-03-25: 130 mg via INTRAVENOUS
  Administered 2015-03-25: 50 mg via INTRAVENOUS
  Administered 2015-03-25: 20 mg via INTRAVENOUS
  Administered 2015-03-25: 50 mg via INTRAVENOUS

## 2015-03-25 MED ORDER — VECURONIUM BROMIDE 10 MG IV SOLR
INTRAVENOUS | Status: AC
  Filled 2015-03-25: qty 10

## 2015-03-25 MED ORDER — PHENYLEPHRINE 40 MCG/ML (10ML) SYRINGE FOR IV PUSH (FOR BLOOD PRESSURE SUPPORT)
PREFILLED_SYRINGE | INTRAVENOUS | Status: AC
  Filled 2015-03-25: qty 10

## 2015-03-25 MED ORDER — LACTATED RINGERS IV SOLN
500.0000 mL | Freq: Once | INTRAVENOUS | Status: DC | PRN
Start: 2015-03-25 — End: 2015-03-27

## 2015-03-25 MED ORDER — EPHEDRINE SULFATE 50 MG/ML IJ SOLN
INTRAMUSCULAR | Status: AC
  Filled 2015-03-25: qty 1

## 2015-03-25 MED ORDER — MORPHINE SULFATE (PF) 2 MG/ML IV SOLN: 1.0000 mg | INTRAVENOUS | Status: AC | PRN

## 2015-03-25 MED ORDER — ANTISEPTIC ORAL RINSE SOLUTION (CORINZ): 7.0000 mL | Freq: Four times a day (QID) | OROMUCOSAL | Status: DC

## 2015-03-25 MED ORDER — CHLORHEXIDINE GLUCONATE 0.12% ORAL RINSE (MEDLINE KIT): 15.0000 mL | Freq: Two times a day (BID) | OROMUCOSAL | Status: DC

## 2015-03-25 MED ORDER — FENTANYL CITRATE (PF) 250 MCG/5ML IJ SOLN
INTRAMUSCULAR | Status: AC
  Filled 2015-03-25: qty 5

## 2015-03-25 MED ORDER — FAMOTIDINE IN NACL 20-0.9 MG/50ML-% IV SOLN
20.0000 mg | Freq: Two times a day (BID) | INTRAVENOUS | Status: AC
  Administered 2015-03-25: 20 mg via INTRAVENOUS

## 2015-03-25 MED ORDER — ACETAMINOPHEN 500 MG PO TABS
1000.0000 mg | ORAL_TABLET | Freq: Four times a day (QID) | ORAL | Status: DC
  Administered 2015-03-25 – 2015-03-30 (×15): 1000 mg via ORAL
  Filled 2015-03-25 (×14): qty 2

## 2015-03-25 MED ORDER — ROCURONIUM BROMIDE 100 MG/10ML IV SOLN
INTRAVENOUS | Status: DC | PRN
  Administered 2015-03-25: 30 mg via INTRAVENOUS
  Administered 2015-03-25: 70 mg via INTRAVENOUS

## 2015-03-25 MED ORDER — THROMBIN 20000 UNITS EX SOLR
CUTANEOUS | Status: AC
  Filled 2015-03-25: qty 20000

## 2015-03-25 MED ORDER — BISACODYL 10 MG RE SUPP: 10.0000 mg | Freq: Every day | RECTAL | Status: DC

## 2015-03-25 MED ORDER — CHLORHEXIDINE GLUCONATE CLOTH 2 % EX PADS: 6.0000 | MEDICATED_PAD | Freq: Every day | CUTANEOUS | Status: DC

## 2015-03-25 MED ORDER — LACTATED RINGERS IV SOLN
INTRAVENOUS | Status: DC | PRN
  Administered 2015-03-25 (×3): via INTRAVENOUS

## 2015-03-25 MED ORDER — ACETAMINOPHEN 160 MG/5ML PO SOLN: 1000.0000 mg | Freq: Four times a day (QID) | ORAL | Status: DC

## 2015-03-25 MED ORDER — VANCOMYCIN HCL IN DEXTROSE 1-5 GM/200ML-% IV SOLN
1000.0000 mg | Freq: Once | INTRAVENOUS | Status: AC
  Administered 2015-03-25: 1000 mg via INTRAVENOUS
  Filled 2015-03-25: qty 200

## 2015-03-25 MED ORDER — ARTIFICIAL TEARS OP OINT
TOPICAL_OINTMENT | OPHTHALMIC | Status: DC | PRN
  Administered 2015-03-25: 1 via OPHTHALMIC

## 2015-03-25 MED ORDER — MUPIROCIN 2 % EX OINT
TOPICAL_OINTMENT | Freq: Two times a day (BID) | CUTANEOUS | Status: DC
  Administered 2015-03-25 – 2015-03-30 (×10): via NASAL
  Filled 2015-03-25: qty 22

## 2015-03-25 MED ORDER — THROMBIN 20000 UNITS EX SOLR
OROMUCOSAL | Status: DC | PRN
  Administered 2015-03-25 (×3): 4 mL via TOPICAL

## 2015-03-25 MED ORDER — HEPARIN SODIUM (PORCINE) 1000 UNIT/ML IJ SOLN
INTRAMUSCULAR | Status: AC
  Filled 2015-03-25: qty 1

## 2015-03-25 MED ORDER — SODIUM CHLORIDE 0.9 % IV SOLN: 250.0000 mL | INTRAVENOUS | Status: DC

## 2015-03-25 MED ORDER — SODIUM CHLORIDE 0.9 % IV SOLN: INTRAVENOUS | Status: DC

## 2015-03-25 MED ORDER — ONDANSETRON HCL 4 MG/2ML IJ SOLN
4.0000 mg | Freq: Four times a day (QID) | INTRAMUSCULAR | Status: DC | PRN
  Administered 2015-03-26: 4 mg via INTRAVENOUS
  Filled 2015-03-25: qty 2

## 2015-03-25 MED ORDER — POTASSIUM CHLORIDE 10 MEQ/50ML IV SOLN: 10.0000 meq | INTRAVENOUS | Status: AC

## 2015-03-25 MED ORDER — FENTANYL CITRATE (PF) 250 MCG/5ML IJ SOLN
INTRAMUSCULAR | Status: DC | PRN
  Administered 2015-03-25: 150 ug via INTRAVENOUS
  Administered 2015-03-25: 100 ug via INTRAVENOUS
  Administered 2015-03-25: 50 ug via INTRAVENOUS
  Administered 2015-03-25: 100 ug via INTRAVENOUS
  Administered 2015-03-25: 50 ug via INTRAVENOUS
  Administered 2015-03-25 (×3): 100 ug via INTRAVENOUS
  Administered 2015-03-25: 150 ug via INTRAVENOUS
  Administered 2015-03-25 (×2): 100 ug via INTRAVENOUS
  Administered 2015-03-25: 150 ug via INTRAVENOUS
  Administered 2015-03-25: 200 ug via INTRAVENOUS
  Administered 2015-03-25: 50 ug via INTRAVENOUS

## 2015-03-25 MED ORDER — LIDOCAINE HCL (CARDIAC) 20 MG/ML IV SOLN
INTRAVENOUS | Status: AC
  Filled 2015-03-25: qty 5

## 2015-03-25 MED ORDER — INSULIN REGULAR BOLUS VIA INFUSION
0.0000 [IU] | Freq: Three times a day (TID) | INTRAVENOUS | Status: DC
  Filled 2015-03-25: qty 10

## 2015-03-25 MED ORDER — INSULIN ASPART 100 UNIT/ML ~~LOC~~ SOLN
0.0000 [IU] | SUBCUTANEOUS | Status: DC
  Administered 2015-03-25 – 2015-03-26 (×4): 2 [IU] via SUBCUTANEOUS

## 2015-03-25 MED ORDER — MIDAZOLAM HCL 10 MG/2ML IJ SOLN
INTRAMUSCULAR | Status: AC
  Filled 2015-03-25: qty 2

## 2015-03-25 MED ORDER — SUCCINYLCHOLINE CHLORIDE 20 MG/ML IJ SOLN
INTRAMUSCULAR | Status: AC
  Filled 2015-03-25: qty 1

## 2015-03-25 MED ORDER — SODIUM CHLORIDE 0.9 % IJ SOLN: 3.0000 mL | INTRAMUSCULAR | Status: DC | PRN

## 2015-03-25 MED ORDER — METOPROLOL TARTRATE 12.5 MG HALF TABLET
12.5000 mg | ORAL_TABLET | Freq: Two times a day (BID) | ORAL | Status: DC
Start: 2015-03-25 — End: 2015-03-28
  Administered 2015-03-26 – 2015-03-27 (×4): 12.5 mg via ORAL
  Filled 2015-03-25 (×4): qty 1

## 2015-03-25 MED ORDER — NITROGLYCERIN IN D5W 200-5 MCG/ML-% IV SOLN: 0.0000 ug/min | INTRAVENOUS | Status: DC

## 2015-03-25 MED ORDER — ATORVASTATIN CALCIUM 40 MG PO TABS
40.0000 mg | ORAL_TABLET | Freq: Every day | ORAL | Status: DC
  Administered 2015-03-26 – 2015-03-29 (×4): 40 mg via ORAL
  Filled 2015-03-25 (×4): qty 1

## 2015-03-25 MED ORDER — STERILE WATER FOR INJECTION IJ SOLN
INTRAMUSCULAR | Status: AC
  Filled 2015-03-25: qty 10

## 2015-03-25 MED ORDER — METOPROLOL TARTRATE 1 MG/ML IV SOLN: 2.5000 mg | INTRAVENOUS | Status: DC | PRN

## 2015-03-25 MED ORDER — ASPIRIN EC 325 MG PO TBEC
325.0000 mg | DELAYED_RELEASE_TABLET | Freq: Every day | ORAL | Status: DC
  Administered 2015-03-26 – 2015-03-30 (×5): 325 mg via ORAL
  Filled 2015-03-25 (×5): qty 1

## 2015-03-25 MED ORDER — CHLORHEXIDINE GLUCONATE 0.12% ORAL RINSE (MEDLINE KIT)
15.0000 mL | Freq: Two times a day (BID) | OROMUCOSAL | Status: DC
  Administered 2015-03-26: 15 mL via OROMUCOSAL

## 2015-03-25 MED ORDER — METOPROLOL TARTRATE 25 MG/10 ML ORAL SUSPENSION: 12.5000 mg | Freq: Two times a day (BID) | ORAL | Status: DC

## 2015-03-25 MED ORDER — MAGNESIUM SULFATE 4 GM/100ML IV SOLN
4.0000 g | Freq: Once | INTRAVENOUS | Status: AC
  Administered 2015-03-25: 4 g via INTRAVENOUS
  Filled 2015-03-25: qty 100

## 2015-03-25 MED ORDER — CETYLPYRIDINIUM CHLORIDE 0.05 % MT LIQD
7.0000 mL | Freq: Two times a day (BID) | OROMUCOSAL | Status: DC
  Administered 2015-03-25 – 2015-03-26 (×3): 7 mL via OROMUCOSAL

## 2015-03-25 MED ORDER — SODIUM CHLORIDE 0.45 % IV SOLN
INTRAVENOUS | Status: DC | PRN
  Administered 2015-03-25: 13:00:00 via INTRAVENOUS

## 2015-03-25 MED ORDER — 0.9 % SODIUM CHLORIDE (POUR BTL) OPTIME
TOPICAL | Status: DC | PRN
  Administered 2015-03-25: 4000 mL

## 2015-03-25 MED ORDER — PANTOPRAZOLE SODIUM 40 MG PO TBEC
40.0000 mg | DELAYED_RELEASE_TABLET | Freq: Every day | ORAL | Status: DC
  Administered 2015-03-27 – 2015-03-30 (×4): 40 mg via ORAL
  Filled 2015-03-25 (×4): qty 1

## 2015-03-25 MED ORDER — DOCUSATE SODIUM 100 MG PO CAPS
200.0000 mg | ORAL_CAPSULE | Freq: Every day | ORAL | Status: DC
  Administered 2015-03-26 – 2015-03-29 (×4): 200 mg via ORAL
  Filled 2015-03-25 (×5): qty 2

## 2015-03-25 MED ORDER — HEMOSTATIC AGENTS (NO CHARGE) OPTIME
TOPICAL | Status: DC | PRN
  Administered 2015-03-25: 1 via TOPICAL

## 2015-03-25 MED ORDER — DEXTROSE 5 % IV SOLN
0.0000 ug/min | INTRAVENOUS | Status: DC
  Administered 2015-03-25: 10 ug/min via INTRAVENOUS
  Administered 2015-03-26: 25 ug/min via INTRAVENOUS
  Filled 2015-03-25 (×2): qty 2

## 2015-03-25 MED ORDER — ACETAMINOPHEN 650 MG RE SUPP
650.0000 mg | Freq: Once | RECTAL | Status: AC
  Administered 2015-03-25: 650 mg via RECTAL

## 2015-03-25 MED ORDER — BISACODYL 5 MG PO TBEC
10.0000 mg | DELAYED_RELEASE_TABLET | Freq: Every day | ORAL | Status: DC
  Administered 2015-03-26 – 2015-03-28 (×3): 10 mg via ORAL
  Filled 2015-03-25 (×3): qty 2

## 2015-03-25 MED ORDER — LACTATED RINGERS IV SOLN
INTRAVENOUS | Status: DC
  Administered 2015-03-26: 12:00:00 via INTRAVENOUS

## 2015-03-25 MED ORDER — OXYCODONE HCL 5 MG PO TABS
5.0000 mg | ORAL_TABLET | ORAL | Status: DC | PRN
  Administered 2015-03-26 – 2015-03-30 (×7): 10 mg via ORAL
  Filled 2015-03-25 (×7): qty 2

## 2015-03-25 MED ORDER — CHLORHEXIDINE GLUCONATE 0.12 % MT SOLN
15.0000 mL | OROMUCOSAL | Status: AC
  Administered 2015-03-25: 15 mL via OROMUCOSAL
  Filled 2015-03-25: qty 15

## 2015-03-25 MED ORDER — DEXTROSE 5 % IV SOLN
1.5000 g | Freq: Two times a day (BID) | INTRAVENOUS | Status: AC
  Administered 2015-03-25 – 2015-03-27 (×4): 1.5 g via INTRAVENOUS
  Filled 2015-03-25 (×4): qty 1.5

## 2015-03-25 MED ORDER — SODIUM CHLORIDE 0.9 % IV SOLN
INTRAVENOUS | Status: DC
  Administered 2015-03-25: 0.7 [IU]/h via INTRAVENOUS
  Administered 2015-03-25: 2.3 [IU]/h via INTRAVENOUS
  Filled 2015-03-25 (×2): qty 2.5

## 2015-03-25 MED ORDER — ACETAMINOPHEN 160 MG/5ML PO SOLN: 650.0000 mg | Freq: Once | ORAL | Status: AC

## 2015-03-25 MED ORDER — LIDOCAINE HCL (CARDIAC) 20 MG/ML IV SOLN
INTRAVENOUS | Status: DC | PRN
  Administered 2015-03-25: 50 mg via INTRAVENOUS

## 2015-03-25 MED ORDER — MIDAZOLAM HCL 2 MG/2ML IJ SOLN: 2.0000 mg | INTRAMUSCULAR | Status: DC | PRN

## 2015-03-25 MED ORDER — HEPARIN SODIUM (PORCINE) 1000 UNIT/ML IJ SOLN
INTRAMUSCULAR | Status: DC | PRN
  Administered 2015-03-25: 36000 [IU] via INTRAVENOUS

## 2015-03-25 MED ORDER — PROTAMINE SULFATE 10 MG/ML IV SOLN
INTRAVENOUS | Status: AC
  Filled 2015-03-25: qty 25

## 2015-03-25 MED FILL — Lidocaine HCl IV Inj 20 MG/ML: INTRAVENOUS | Qty: 5 | Status: AC

## 2015-03-25 MED FILL — Sodium Chloride IV Soln 0.9%: INTRAVENOUS | Qty: 2000 | Status: AC

## 2015-03-25 MED FILL — Magnesium Sulfate Inj 50%: INTRAMUSCULAR | Qty: 10 | Status: AC

## 2015-03-25 MED FILL — Sodium Bicarbonate IV Soln 8.4%: INTRAVENOUS | Qty: 50 | Status: AC

## 2015-03-25 MED FILL — Mannitol IV Soln 20%: INTRAVENOUS | Qty: 500 | Status: AC

## 2015-03-25 MED FILL — Heparin Sodium (Porcine) Inj 1000 Unit/ML: INTRAMUSCULAR | Qty: 30 | Status: AC

## 2015-03-25 MED FILL — Heparin Sodium (Porcine) Inj 1000 Unit/ML: INTRAMUSCULAR | Qty: 10 | Status: AC

## 2015-03-25 MED FILL — Potassium Chloride Inj 2 mEq/ML: INTRAVENOUS | Qty: 40 | Status: AC

## 2015-03-25 SURGICAL SUPPLY — 84 items
ADAPTER CARDIO PERF ANTE/RETRO (ADAPTER) ×4 IMPLANT
ADPR PRFSN 84XANTGRD RTRGD (ADAPTER) ×2
AORTIC VALCE MAGNA EASE MODEL 3300TFX ×2 IMPLANT
BAG DECANTER FOR FLEXI CONT (MISCELLANEOUS) ×4 IMPLANT
BLADE STERNUM SYSTEM 6 (BLADE) ×4 IMPLANT
BLADE SURG 15 STRL LF DISP TIS (BLADE) ×2 IMPLANT
BLADE SURG 15 STRL SS (BLADE) ×4
CANISTER SUCTION 2500CC (MISCELLANEOUS) ×4 IMPLANT
CANNULA GUNDRY RCSP 15FR (MISCELLANEOUS) ×4 IMPLANT
CATH HEART VENT LEFT (CATHETERS) IMPLANT
CATH ROBINSON RED A/P 18FR (CATHETERS) ×12 IMPLANT
CATH THORACIC 28FR (CATHETERS) IMPLANT
CATH THORACIC 36FR (CATHETERS) ×4 IMPLANT
CATH THORACIC 36FR RT ANG (CATHETERS) ×4 IMPLANT
CONT SPEC STER OR (MISCELLANEOUS) ×4 IMPLANT
COVER SURGICAL LIGHT HANDLE (MISCELLANEOUS) ×4 IMPLANT
CRADLE DONUT ADULT HEAD (MISCELLANEOUS) ×4 IMPLANT
DRAPE SLUSH/WARMER DISC (DRAPES) ×4 IMPLANT
DRSG AQUACEL AG ADV 3.5X14 (GAUZE/BANDAGES/DRESSINGS) ×2 IMPLANT
DRSG COVADERM 4X14 (GAUZE/BANDAGES/DRESSINGS) ×4 IMPLANT
ELECT CAUTERY BLADE 6.4 (BLADE) ×4 IMPLANT
ELECT REM PT RETURN 9FT ADLT (ELECTROSURGICAL) ×8
ELECTRODE REM PT RTRN 9FT ADLT (ELECTROSURGICAL) ×4 IMPLANT
GAUZE SPONGE 4X4 12PLY STRL (GAUZE/BANDAGES/DRESSINGS) ×4 IMPLANT
GLOVE BIO SURGEON STRL SZ 6 (GLOVE) ×10 IMPLANT
GLOVE BIO SURGEON STRL SZ 6.5 (GLOVE) ×5 IMPLANT
GLOVE BIO SURGEON STRL SZ7 (GLOVE) IMPLANT
GLOVE BIO SURGEON STRL SZ7.5 (GLOVE) ×6 IMPLANT
GLOVE BIO SURGEONS STRL SZ 6.5 (GLOVE) ×5
GLOVE EUDERMIC 7 POWDERFREE (GLOVE) ×10 IMPLANT
GLOVE NEODERM STER SZ 7 (GLOVE) ×8 IMPLANT
GOWN STRL REUS W/ TWL LRG LVL3 (GOWN DISPOSABLE) ×8 IMPLANT
GOWN STRL REUS W/ TWL XL LVL3 (GOWN DISPOSABLE) ×2 IMPLANT
GOWN STRL REUS W/TWL LRG LVL3 (GOWN DISPOSABLE) ×16
GOWN STRL REUS W/TWL XL LVL3 (GOWN DISPOSABLE) ×4
HEART VENT LT CURVED (MISCELLANEOUS) ×4 IMPLANT
HEMOSTAT POWDER SURGIFOAM 1G (HEMOSTASIS) ×12 IMPLANT
HEMOSTAT SURGICEL 2X14 (HEMOSTASIS) ×4 IMPLANT
KIT BASIN OR (CUSTOM PROCEDURE TRAY) ×4 IMPLANT
KIT CATH CPB BARTLE (MISCELLANEOUS) ×4 IMPLANT
KIT ROOM TURNOVER OR (KITS) ×4 IMPLANT
KIT SUCTION CATH 14FR (SUCTIONS) ×4 IMPLANT
LINE VENT (MISCELLANEOUS) ×2 IMPLANT
NS IRRIG 1000ML POUR BTL (IV SOLUTION) ×22 IMPLANT
PACK OPEN HEART (CUSTOM PROCEDURE TRAY) ×4 IMPLANT
PAD ARMBOARD 7.5X6 YLW CONV (MISCELLANEOUS) ×8 IMPLANT
SET CARDIOPLEGIA MPS 5001102 (MISCELLANEOUS) ×2 IMPLANT
SPONGE GAUZE 4X4 12PLY STER LF (GAUZE/BANDAGES/DRESSINGS) ×2 IMPLANT
SUT BONE WAX W31G (SUTURE) ×4 IMPLANT
SUT ETHIBON 2 0 V 52N 30 (SUTURE) ×8 IMPLANT
SUT ETHIBOND 2 0 SH (SUTURE) ×20
SUT ETHIBOND 2 0 SH 36X2 (SUTURE) IMPLANT
SUT PROLENE 3 0 SH DA (SUTURE) IMPLANT
SUT PROLENE 3 0 SH1 36 (SUTURE) ×6 IMPLANT
SUT PROLENE 4 0 RB 1 (SUTURE) ×24
SUT PROLENE 4-0 RB1 .5 CRCL 36 (SUTURE) ×6 IMPLANT
SUT PROLENE 5 0 C 1 36 (SUTURE) ×4 IMPLANT
SUT PROLENE 6 0 C 1 30 (SUTURE) ×4 IMPLANT
SUT PROLENE 8 0 BV175 6 (SUTURE) ×4 IMPLANT
SUT SILK  1 MH (SUTURE) ×6
SUT SILK 1 MH (SUTURE) IMPLANT
SUT SILK 1 TIES 10X30 (SUTURE) ×2 IMPLANT
SUT SILK 2 0 SH CR/8 (SUTURE) ×6 IMPLANT
SUT SILK 2 0 TIES 10X30 (SUTURE) ×2 IMPLANT
SUT SILK 2 0 TIES 17X18 (SUTURE) ×4
SUT SILK 2-0 18XBRD TIE BLK (SUTURE) IMPLANT
SUT SILK 3 0 SH CR/8 (SUTURE) ×2 IMPLANT
SUT SILK 4 0 TIE 10X30 (SUTURE) ×4 IMPLANT
SUT STEEL 6MS V (SUTURE) IMPLANT
SUT TEM PAC WIRE 2 0 SH (SUTURE) ×8 IMPLANT
SUT VIC AB 1 CTX 36 (SUTURE) ×8
SUT VIC AB 1 CTX36XBRD ANBCTR (SUTURE) ×4 IMPLANT
SUT VIC AB 2-0 CTX 27 (SUTURE) ×6 IMPLANT
SUT VIC AB 3-0 X1 27 (SUTURE) ×4 IMPLANT
SUTURE E-PAK OPEN HEART (SUTURE) ×4 IMPLANT
SYSTEM SAHARA CHEST DRAIN ATS (WOUND CARE) ×4 IMPLANT
TAPE CLOTH SURG 4X10 WHT LF (GAUZE/BANDAGES/DRESSINGS) ×2 IMPLANT
TOWEL OR 17X24 6PK STRL BLUE (TOWEL DISPOSABLE) ×8 IMPLANT
TOWEL OR 17X26 10 PK STRL BLUE (TOWEL DISPOSABLE) ×8 IMPLANT
TRAY FOLEY IC TEMP SENS 16FR (CATHETERS) ×4 IMPLANT
UNDERPAD 30X30 INCONTINENT (UNDERPADS AND DIAPERS) ×4 IMPLANT
VALVE MAGNA EASE AORTIC 27MM (Prosthesis & Implant Heart) ×2 IMPLANT
VENT LEFT HEART 12002 (CATHETERS) ×4
WATER STERILE IRR 1000ML POUR (IV SOLUTION) ×12 IMPLANT

## 2015-03-25 NOTE — Anesthesia Postprocedure Evaluation (Signed)
Anesthesia Post Note  Patient: Zachary [name]  Procedure(s) Performed: Procedure(s) (LRB): AORTIC VALVE REPLACEMENT (AVR) (N/A) TRANSESOPHAGEAL ECHOCARDIOGRAM (TEE) (N/A)  Patient location during evaluation: PACU Anesthesia Type: General Level of consciousness: awake Pain management: pain level controlled Vital Signs Assessment: post-procedure vital signs reviewed and stable Respiratory status: spontaneous breathing Cardiovascular status: blood pressure returned to baseline Anesthetic complications: no    Last Vitals:  Filed Vitals:   03/25/15 1615 03/25/15 1630  BP:    Pulse: 73 76  Temp: 36.5 C 36.6 C  Resp: 22 19    Last Pain: There were no vitals filed for this visit.               EDWARDS,Alvia Tory

## 2015-03-25 NOTE — Op Note (Signed)
CARDIOVASCULAR SURGERY OPERATIVE NOTE  03/25/2015 Zachary [name] [id number]  Surgeon:  Gaye Pollack, MD  First Assistant: Jadene Pierini,  PA-C   Preoperative Diagnosis:  Severe aortic stenosis   Postoperative Diagnosis:  Same   Procedure:  1. Median Sternotomy 2. Extracorporeal circulation 3.   Aortic valve replacement using a 27 mm  Edwards Magna-Ease pericardial valve.  Anesthesia:  General Endotracheal   Clinical History/Surgical Indication:  The patient is a 70 year old gentleman with hypertension, hyperlipidemia and known aortic stenosis followed by Dr. Tamala Julian. His echo in 03/2013 showed a mean gradient of 28 mm Hg consistent with moderate AS. He now presents with progressive exertional fatigue and a couple episodes where he felt like he might pass out if he did not stop what he was doing. He had an echo on 02/10/2015 that shows progression to severe AS with a mean gradient of 49 mm Hg and an AVA of 1.08 cm2. His LVEF has remained normal at 60-65% with moderate LVH. The aortic root was measured at 39 mm Hg and the ascending aorta at 42 mm Hg.   He has stage D severe, symptomatic aortic stenosis with progressive exertional fatigue, shortness of breath and dizziness. I have personally reviewed his echo and cath. He has a tri-leaflet aortic valve with severely calcified and poorly mobile leaflets. There is mild aortic root and ascending aortic dilation but I don't think it is large enough to warrant replacement. The mitral valve leaflets are mildly calcified and thickened but the peak gradient is only 9 mm Hg and the valve leaflets appear to move well with a valve area of 2.48 cm2. His cath shows non-obstructive stenoses in the proximal RCA and proximal to mid LAD that is insignificant by FFR measured at 0.86. I agree with Dr. Tamala Julian that CABG is not needed and these lesions would be amenable to PCI in the future if they progressed. I have recommended a tissue valve given his age of 75  and history of some GI blood loss on aspirin. I discussed the operative procedure with the patient and his wife including alternatives, benefits and risks; including but not limited to bleeding, blood transfusion, infection, stroke, myocardial infarction, graft failure, heart block requiring a permanent pacemaker, organ dysfunction, and death. Zachary [name] understands and agrees to proceed.    Preparation:  The patient was seen in the preoperative holding area and the correct patient, correct operation were confirmed with the patient after reviewing the medical record and catheterization. The consent was signed by me. Preoperative antibiotics were given. A pulmonary arterial line and radial arterial line were placed by the anesthesia team. The patient was taken back to the operating room and positioned supine on the operating room table. After being placed under general endotracheal anesthesia by the anesthesia team a foley catheter was placed. The neck, chest, abdomen, and both legs were prepped with betadine soap and solution and draped in the usual sterile manner. A surgical time-out was taken and the correct patient and operative procedure were confirmed with the nursing and anesthesia staff.   Pre-bypass TEE:   Complete TEE assessment was performed by Dr. Finis Bud. This showed a trileaflet aortic valve with calcified leaflets and severe AS. There was normal LV function with moderate LVH. The mitral valve had mildly thickened and calcified leaflets with minimal mean gradient and no regurgitation.    Post-bypass TEE:   Normal functioning prosthetic aortic valve with no perivalvular leak or regurgitation through the valve.  Left ventricular function preserved. No mitral regurgitation.    Cardiopulmonary Bypass:  A median sternotomy was performed. The pericardium was opened in the midline. Right ventricular function appeared normal. The ascending aorta was of normal size and had no  palpable plaque. There were no contraindications to aortic cannulation or cross-clamping. The patient was fully systemically heparinized and the ACT was maintained > 400 sec. The proximal aortic arch was cannulated with a 20 F aortic cannula for arterial inflow. Venous cannulation was performed via the right atrial appendage using a two-staged venous cannula. An antegrade cardioplegia/vent cannula was inserted into the mid-ascending aorta. A left ventricular vent was placed via the right superior pulmonary vein. A retrograde cardioplegia cannnula was placed into the coronary sinus via the right atrium. Aortic occlusion was performed with a single cross-clamp. Systemic cooling to 32 degrees Centigrade and topical cooling of the heart with iced saline were used. Hyperkalemic antegrade cold blood cardioplegia was used to induce diastolic arrest and then cold blood retrograde cardioplegia was given at about 20 minute intervals throughout the period of arrest to maintain myocardial temperature at or below 10 degrees centigrade. A temperature probe was inserted into the interventricular septum and an insulating pad was placed in the pericardium. Carbon dioxide was insufflated into the pericardium at 5L/min throughout the procedure to minimize intracardiac air.   Aortic Valve Replacement:   A transverse aortotomy was performed 1 cm above the take-off of the right coronary artery. The native valve was tricuspid with calcified leaflets and mild annular calcification. The right and non-coronary leaflets were completely fused.  The ostia of the coronary arteries were in normal position and were not obstructed. The native valve leaflets were excised and the annulus was decalcified with rongeurs. Care was taken to remove all particulate debris. The left ventricle was directly inspected for debris and then irrigated with ice saline solution. The annulus was sized and a size 27 mm  Edwards Magna-Ease pericardial valve was  chosen. The model number was [id number] and the serial number was [id number]. While the valve was being prepared 2-0 Ethibond pledgeted horizontal mattress sutures were placed around the annulus with the pledgets in a sub-annular position. The sutures were placed through the sewing ring and the valve lowered into place. The sutures were tied sequentially. The valve seated nicely and the coronary ostia were not obstructed. The prosthetic valve leaflets moved normally and there was no sub-valvular obstruction. The aortotomy was closed using 4-0 Prolene suture in 2 layers with felt strips to reinforce the closure.  Completion:  The patient was rewarmed to 37 degrees Centigrade. De-airing maneuvers were performed and the head placed in trendelenburg position. The crossclamp was removed with a time of 86 minutes. There was spontaneous return of sinus rhythm. The aortotomy was checked for hemostasis. Two temporary epicardial pacing wires were placed on the right atrium and two on the right ventricle. The left ventricular vent and retrograde cardioplegia cannulas were removed. The patient was weaned from CPB without difficulty on no inotropes. CPB time was 111 minutes. Cardiac output was 6 LPM. Heparin was fully reversed with protamine and the aortic and venous cannulas removed. Hemostasis was achieved. Mediastinal drainage tubes were placed. The sternum was closed with double #6 stainless steel wires. The fascia was closed with continuous # 1 vicryl suture. The subcutaneous tissue was closed with 2-0 vicryl continuous suture. The skin was closed with 3-0 vicryl subcuticular suture. All sponge, needle, and instrument counts were reported correct at the end of  the case. Dry sterile dressings were placed over the incisions and around the chest tubes which were connected to pleurevac suction. The patient was then transported to the surgical intensive care unit in critical but stable condition.

## 2015-03-25 NOTE — Transfer of Care (Signed)
Immediate Anesthesia Transfer of Care Note  Patient: Zachary [name]  Procedure(s) Performed: Procedure(s): AORTIC VALVE REPLACEMENT (AVR) (N/A) TRANSESOPHAGEAL ECHOCARDIOGRAM (TEE) (N/A)  Patient Location: SICU  Anesthesia Type:General  Level of Consciousness: sedated and Patient remains intubated per anesthesia plan  Airway & Oxygen Therapy: Patient remains intubated per anesthesia plan and Patient placed on Ventilator (see vital sign flow sheet for setting)  Post-op Assessment: Report given to RN and Post -op Vital signs reviewed and stable  Post vital signs: Reviewed and stable  Last Vitals:  Filed Vitals:   03/25/15 0554  BP: 127/46  Pulse: 68  Resp: 18    Complications: No apparent anesthesia complications

## 2015-03-25 NOTE — Brief Op Note (Signed)
03/25/2015  10:44 AM  PATIENT:  Zachary [name]  69 y.o. male  PRE-OPERATIVE DIAGNOSIS:  SEVERE AS  POST-OPERATIVE DIAGNOSIS:  SEVERE AS  PROCEDURE:  Procedure(s): AORTIC VALVE REPLACEMENT (AVR) (N/A) TRANSESOPHAGEAL ECHOCARDIOGRAM (TEE) (N/A)  SURGEON:  Surgeon(s) and Role:    * Gaye Pollack, MD - Primary  PHYSICIAN ASSISTANT: Charisa Twitty PA-C  ANESTHESIA:   general   Aortic Valve  Procedure Performed:  Replacement: Yes.  Bioprosthetic Valve. Implant Model Number:[id number], Size:27, Unique Device Identifier:[id number]  Repair/Reconstruction: No.   Aortic Annular Enlargement: No.  Aortic Valve Etiology   Aortic Insufficiency:  Moderate  Aortic Valve Disease:  Yes.  Aortic Stenosis:  Yes. Smallest Aortic Valve Area: 1.08cm2; Highest Mean Gradient: 20mmHg.  Etiology (Choose at least one and up to  5 etiologies):  Degenerative - Calcified    EBL:  Total I/O In: -  Out: 325 [Urine:325]  BLOOD ADMINISTERED:SEE ANEST/PERFUSION RECORDS  DRAINS:ROUTINE  LOCAL MEDICATIONS USED:  NONE  SPECIMEN: AORTIC VALVE LEAFLETS  DISPOSITION OF SPECIMEN:  PATHOLOGY  COUNTS:  YES  TOURNIQUET:  * No tourniquets in log *  DICTATION: .Other Dictation: Dictation Number SEE NOTES  PLAN OF CARE: Admit to inpatient   PATIENT DISPOSITION:  ICU - intubated and hemodynamically stable.   Delay start of Pharmacological VTE agent (>24hrs) due to surgical blood loss or risk of bleeding: yes

## 2015-03-25 NOTE — Progress Notes (Signed)
Initial Nutrition Assessment  DOCUMENTATION CODES:   Not applicable  INTERVENTION:    If TF started, recommend Vital AF 1.2 formula -- initiate at 25 ml/hr and increase by 10 ml every 4 hours to goal rate of 55 ml/hr   Prostat liquid protein 30 ml BID via tube  Total TF regimen to provide 1884 kcals, 144 gm protein, 1071 ml of free water  NUTRITION DIAGNOSIS:   Inadequate oral intake related to inability to eat as evidenced by NPO status  GOAL:   Patient will meet greater than or equal to 90% of their needs  MONITOR:   Vent status, Labs, Weight trends, I & O's  REASON FOR ASSESSMENT:   Ventilator  ASSESSMENT:   69 yo Male with severe, symptomatic aortic stenosis with progressive exertional fatigue, shortness of breath and dizziness; presented for surgical intervention.  Patient s/p procedure 12/1: AORTIC VALVE REPLACEMENT (AVR)   Patient is currently intubated on ventilator support -- OGT in place MV: 10.2 L/min Temp (24hrs), Avg:97.1 F (36.2 C), Min:96.8 F (36 C), Max:97.5 F (36.4 C)   Diet Order:   NPO  Skin:  Reviewed, no issues  Last BM:  N/A  Height:   Ht Readings from Last 1 Encounters:  03/25/15 5' 11.5" (1.816 m)    Weight:   Wt Readings from Last 1 Encounters:  03/25/15 207 lb 0.2 oz (93.9 kg)    Ideal Body Weight:  78.1 kg  BMI:  Body mass index is 28.47 kg/(m^2).  Estimated Nutritional Needs:   Kcal:  [id number]  Protein:  135-145 gm  Fluid:  per MD  EDUCATION NEEDS:   No education needs identified at this time  Arthur Holms, RD, LDN Pager #: [phone] After-Hours Pager #: [phone]

## 2015-03-25 NOTE — Anesthesia Preprocedure Evaluation (Addendum)
Anesthesia Evaluation  Patient identified by MRN, date of birth, ID band Patient awake    Reviewed: Allergy & Precautions, NPO status , Patient's Chart, lab work & pertinent test results  Airway Mallampati: II  TM Distance: >3 FB Neck ROM: Full    Dental   Pulmonary neg pulmonary ROS,    breath sounds clear to auscultation       Cardiovascular hypertension, + CAD   Rhythm:Regular Rate:Normal     Neuro/Psych    GI/Hepatic Neg liver ROS, GERD  ,  Endo/Other  negative endocrine ROS  Renal/GU negative Renal ROS     Musculoskeletal   Abdominal   Peds  Hematology   Anesthesia Other Findings   Reproductive/Obstetrics                            Anesthesia Physical Anesthesia Plan  ASA: IV  Anesthesia Plan: General   Post-op Pain Management:    Induction: Intravenous  Airway Management Planned: Oral ETT  Additional Equipment: PA Cath, Arterial line, Ultrasound Guidance Line Placement and TEE  Intra-op Plan:   Post-operative Plan: Post-operative intubation/ventilation  Informed Consent: I have reviewed the patients History and Physical, chart, labs and discussed the procedure including the risks, benefits and alternatives for the proposed anesthesia with the patient or authorized representative who has indicated his/her understanding and acceptance.   Dental advisory given  Plan Discussed with: CRNA and Anesthesiologist  Anesthesia Plan Comments:        Anesthesia Quick Evaluation

## 2015-03-25 NOTE — Interval H&P Note (Signed)
History and Physical Interval Note:  03/25/2015 5:50 AM  Zachary [name]  has presented today for surgery, with the diagnosis of SEVERE AS  The various methods of treatment have been discussed with the patient and family. After consideration of risks, benefits and other options for treatment, the patient has consented to  Procedure(s): AORTIC VALVE REPLACEMENT (AVR) (N/A) TRANSESOPHAGEAL ECHOCARDIOGRAM (TEE) (N/A) as a surgical intervention .  The patient's history has been reviewed, patient examined, no change in status, stable for surgery.  I have reviewed the patient's chart and labs.  Questions were answered to the patient's satisfaction.     Gaye Pollack

## 2015-03-25 NOTE — Procedures (Signed)
Extubation Procedure Note  Patient Details:   Name: TYREEK KENNEMER DOB: 11-Feb-1946 MRN: [id number]   Airway Documentation:     Evaluation  O2 sats: stable throughout Complications: No apparent complications Patient did tolerate procedure well. Bilateral Breath Sounds: Clear Suctioning: Airway Yes Pt extubated to 4L [place].  NIF greater than -20, VC `1137 mL.  Cuff leak heard.  No stridor noted.  RN at bedside.  Donnetta Hail 03/25/2015, 5:11 PM

## 2015-03-25 NOTE — Progress Notes (Signed)
Patient ID: Zachary [name], male   DOB: Nov 03, 1945, 69 y.o.   MRN: [id number]   SICU Evening Rounds:   Hemodynamically stable  CI = 2.7  Extubated and alert, neuro intact  Urine output good  CT output low  CBC    Component Value Date/Time   WBC 14.1* 03/25/2015 1800   RBC 3.65* 03/25/2015 1800   HGB 12.0* 03/25/2015 1800   HCT 35.9* 03/25/2015 1800   PLT 174 03/25/2015 1800   MCV 98.4 03/25/2015 1800   MCH 32.9 03/25/2015 1800   MCHC 33.4 03/25/2015 1800   RDW 12.8 03/25/2015 1800   LYMPHSABS 2.5 03/03/2015 1703   MONOABS 0.8 03/03/2015 1703   EOSABS 0.4 03/03/2015 1703   BASOSABS 0.1 03/03/2015 1703     BMET    Component Value Date/Time   NA 138 03/25/2015 1757   K 4.4 03/25/2015 1757   CL 106 03/25/2015 1757   CO2 23 03/24/2015 0850   GLUCOSE 135* 03/25/2015 1757   BUN 20 03/25/2015 1757   CREATININE 1.48* 03/25/2015 1800   CREATININE 1.30* 03/03/2015 1703   CALCIUM 9.5 03/24/2015 0850   GFRNONAA 47* 03/25/2015 1800   GFRAA 54* 03/25/2015 1800     A/P:  Stable postop course. Continue current plans

## 2015-03-25 NOTE — Anesthesia Procedure Notes (Addendum)
Anesthesia Procedure Note PA catheter:  Routine monitors. Timeout, sterile prep, drape, FBP R neck.  Trendelenburg position.  1% [place] local, finder and trocar RIJ 1st pass with US guidance.  Cordis placed over J wire. PA catheter in easily.  Sterile dressing applied.  Patient tolerated well, VSS.  Jenita Seashore, MD [phone] Procedure Name: Intubation Date/Time: 03/25/2015 7:44 AM Performed by: Julian Reil Pre-anesthesia Checklist: Patient identified, Emergency Drugs available, Suction available and Patient being monitored Patient Re-evaluated:Patient Re-evaluated prior to inductionOxygen Delivery Method: Circle system utilized Preoxygenation: Pre-oxygenation with 100% oxygen Intubation Type: IV induction Ventilation: Mask ventilation without difficulty Laryngoscope Size: Glidescope and 3 Grade View: Grade I Tube type: Oral Tube size: 8.0 mm Number of attempts: 1 Airway Equipment and Method: Stylet and Video-laryngoscopy Placement Confirmation: ETT inserted through vocal cords under direct vision,  positive ETCO2 and breath sounds checked- equal and bilateral Secured at: 22 cm Tube secured with: Tape Dental Injury: Teeth and Oropharynx as per pre-operative assessment  Comments: Easy mask airway. Glidescope used d/t small oral opening. Grade 1 view with Glide. Atraumatic oral intubation.

## 2015-03-25 NOTE — Progress Notes (Signed)
  Echocardiogram Echocardiogram Transesophageal has been performed.  Jennette Dubin 03/25/2015, 9:24 AM

## 2015-03-25 NOTE — OR Nursing (Signed)
SICU notified of 1 hr call [phone]

## 2015-03-25 NOTE — OR Nursing (Signed)
SICU notifed of 45 min call

## 2015-03-25 NOTE — Plan of Care (Signed)
Insulin drip stuck at .7 units/hr for last several hours, CBG 120-130.  Per Dr. Cyndia Bent stop insulin drip and do TCTS q 4 hour sliding scale. (Patient not diabetic).

## 2015-03-25 NOTE — H&P (Signed)
[address] [facility]       [place] [address]             [phone]      Cardiothoracic Surgery History and Physical    PCP is Lester Kinsman, PA-C Referring Provider is Belva Crome, MD  Chief Complaint  Patient presents with  . Aortic Stenosis    eval for AVR    HPI:  The patient is a 69 year old gentleman with hypertension, hyperlipidemia and known aortic stenosis followed by Dr. Tamala Julian. His echo in 03/2013 showed a mean gradient of 28 mm Hg consistent with moderate AS. He now presents with progressive exertional fatigue and a couple episodes where he felt like he might pass out if he did not stop what he was doing. He had an echo on 02/10/2015 that shows progression to severe AS with a mean gradient of 49 mm Hg and an AVA of 1.08 cm2. His LVEF has remained normal at 60-65% with moderate LVH. The aortic root was measured at 39 mm Hg and the ascending aorta at 42 mm Hg.    Past Medical History  Diagnosis Date  . HTN (hypertension)   . Erectile dysfunction   . Hypercholesteremia   . GERD (gastroesophageal reflux disease)   . Back pain   . Aortic valvar stenosis     Past Surgical History  Procedure Laterality Date  . Tonsillectomy    . Lumbar disc surgery    . Cardiac catheterization N/A 03/10/2015    Procedure: Right/Left Heart Cath and Coronary Angiography; Surgeon: Belva Crome, MD; Location: [facility]; Service: Cardiovascular; Laterality: N/A;  . Cardiac catheterization N/A 03/10/2015    Procedure: Intravascular Pressure Wire/FFR Study; Surgeon: Belva Crome, MD; Location: [facility]; Service: Cardiovascular; Laterality: N/A;    Family History  Problem Relation Age of Onset  . Heart disease Mother   . Diabetes Mother   . Cancer Father   . Healthy Sister   . Hyperlipidemia Brother   . Healthy Sister   . Healthy Sister      Social History Social History  Substance Use Topics  . Smoking status: Never Smoker   . Smokeless tobacco: Never Used  . Alcohol Use: No    Current Outpatient Prescriptions  Medication Sig Dispense Refill  . acetaminophen (TYLENOL) 500 MG tablet Take 500 mg by mouth every 6 (six) hours as needed for mild pain.    Marland Kitchen atorvastatin (LIPITOR) 40 MG tablet Take 40 mg by mouth daily.    Marland Kitchen lisinopril (PRINIVIL,ZESTRIL) 10 MG tablet Take 10 mg by mouth daily.    . ranitidine (ZANTAC) 150 MG capsule Take 150 mg by mouth daily.      No current facility-administered medications for this visit.    No Known Allergies  Review of Systems  Constitutional: Positive for activity change and fatigue. Negative for fever, chills and unexpected weight change.  HENT:   Last saw his dentist last month and was told that he had a fractured tooth that is causing him some pain with cold or sweets. He needs a crown. Also told that he has cavities under two crowns and a bridge.  Eyes: Negative.  Respiratory: Positive for shortness of breath.   With exertion  Cardiovascular: Positive for palpitations. Negative for chest pain and leg swelling.  Gastrointestinal:   Reflux  Endocrine: Negative.  Genitourinary: Positive for frequency.  Musculoskeletal: Positive for arthralgias.  Skin: Negative.  Allergic/Immunologic: Negative.  Neurological: Positive for dizziness.  Hematological:   History of blood in stool when taking aspirin 325 mg. Resolved after stopping aspirin.  Psychiatric/Behavioral: Negative.    BP 129/74 mmHg  Pulse 105  Resp 18  Ht 6' (1.829 m)  Wt 209 lb (94.802 kg)  BMI 28.34 kg/m2  SpO2 96% Physical Exam  Constitutional: He is oriented to person, place, and time. He appears well-developed and well-nourished. No distress.  HENT:  Head: Normocephalic and atraumatic.  Mouth/Throat: Oropharynx is clear and moist.    Eyes: EOM are normal. Pupils are equal, round, and reactive to light.  Neck: Normal range of motion. Neck supple. No JVD present. No thyromegaly present.  Cardiovascular: Normal rate, regular rhythm and intact distal pulses.  Murmur heard. 3/6 harsh systolic murmur along RSB  Pulmonary/Chest: Effort normal and breath sounds normal. No respiratory distress. He exhibits no tenderness.  Abdominal: Soft. Bowel sounds are normal. He exhibits no distension and no mass. There is no tenderness.  Musculoskeletal: Normal range of motion. He exhibits no edema.  Lymphadenopathy:   He has no cervical adenopathy.  Neurological: He is alert and oriented to person, place, and time. He has normal strength. No cranial nerve deficit or sensory deficit.  Skin: Skin is warm and dry.  Psychiatric: He has a normal mood and affect.     Diagnostic Tests:     [name]            [address], [place] [address]              [phone]  ------------------------------------------------------------------- Transthoracic Echocardiography  Patient:  Zachary [name], Zachary [name] MR #:    [id number] Study Date: 02/10/2015 Gender:   M Age:    22 Height:   180.3 cm Weight:   90.3 kg BSA:    2.14 m^2 Pt. Status: Room:  Ivar Bury, MD REFERRING  Belva Crome, MD SONOGRAPHER [place], [place] ATTENDING  Loralie Champagne, M.D. PERFORMING  [facility], Outpatient  cc:  ------------------------------------------------------------------- LV EF: 60% -  65%  ------------------------------------------------------------------- Indications:   (I35.9).  ------------------------------------------------------------------- History:  PMH: Acquired from the patient and from the patient&'s chart. Aortic stenosis. Risk factors:  Hypertension. Dyslipidemia.  ------------------------------------------------------------------- Study Conclusions  - Left ventricle: The cavity size was normal. Wall thickness was increased in a pattern of moderate LVH. Systolic function was normal. The estimated ejection fraction was in the range of 60% to 65%. Wall motion was normal; there were no regional wall motion abnormalities. Doppler parameters are consistent with abnormal left ventricular relaxation (grade 1 diastolic dysfunction). - Aortic valve: Trileaflet; severely calcified leaflets. There was severe stenosis. There was mild regurgitation. Mean gradient (S): 49 mm Hg. Valve area (VTI): 1.08 cm^2. - Aorta: Mildly dilated aortic root and ascending aorta. Aortic root dimension: 39 mm (ED). Ascending aortic diameter: 42 mm (S). - Mitral valve: There is a hockey stick appearance to the mitral valve suggestive of rheumatic valve disease. Mildly thickened, mildly calcified leaflets . I suspect mild mitral stenosis. There was no significant regurgitation. Peak gradient (D): 9 mm Hg. Valve area by pressure half-time: 2.48 cm^2. - Left atrium: The atrium was mildly dilated. - Right ventricle: The cavity size was normal. Systolic function was normal. - Pulmonary arteries: No complete TR doppler jet so unable to estimate PA systolic pressure. - Inferior vena cava: The vessel was normal in size. The  respirophasic diameter changes were in the normal range (>= 50%), consistent with normal central venous pressure.  Impressions:  - Normal LV size with moderate LV hypertrophy. EF 60-65%. Aortic stenosis with mean gradient 49 mmHg and AVA 1.08 cm^2 (calculates to larger than expected valve area due to large LVOT diameter). I suspect that aortic stenosis is severe. There is a thickened, rheumatic-appearing mitral valve. The valve was not fully interrogated, but suspect mitral stenosis is  no more than mild. Normal RV size and systolic function.  Transthoracic echocardiography. M-mode, complete 2D, spectral Doppler, and color Doppler. Birthdate: Patient birthdate: 1945-08-16. Age: Patient is 69 yr old. Sex: Gender: male. BMI: 27.8 kg/m^2. Blood pressure:   122/80 Patient status: Outpatient. Study date: Study date: 02/10/2015. Study time: 09:41 AM. Location: [facility]  -------------------------------------------------------------------  ------------------------------------------------------------------- Left ventricle: The cavity size was normal. Wall thickness was increased in a pattern of moderate LVH. Systolic function was normal. The estimated ejection fraction was in the range of 60% to 65%. Wall motion was normal; there were no regional wall motion abnormalities. Doppler parameters are consistent with abnormal left ventricular relaxation (grade 1 diastolic dysfunction).  ------------------------------------------------------------------- Aortic valve:  Trileaflet; severely calcified leaflets. Doppler: There was severe stenosis.  There was mild regurgitation.  VTI ratio of LVOT to aortic valve: 0.24. Valve area (VTI): 1.08 cm^2. Indexed valve area (VTI): 0.54 cm^2/m^2. Peak velocity ratio of LVOT to aortic valve: 0.23. Valve area (Vmax): 1.11 cm^2. Indexed valve area (Vmax): 0.52 cm^2/m^2. Mean velocity ratio of LVOT to aortic valve: 0.22. Valve area (Vmean): 1.09 cm^2. Indexed valve area (Vmean): 0.51 cm^2/m^2.  Mean gradient (S): 49 mm Hg. Peak gradient (S): 80 mm Hg.  ------------------------------------------------------------------- Aorta: Mildly dilated aortic root and ascending aorta.  ------------------------------------------------------------------- Mitral valve: There is a hockey stick appearance to the mitral valve suggestive of rheumatic valve disease. Mildly thickened, mildly calcified leaflets . I suspect mild  mitral stenosis. Doppler: There was no significant regurgitation.  Valve area by pressure half-time: 2.48 cm^2. Indexed valve area by pressure half-time: 1.16 cm^2/m^2.  Peak gradient (D): 9 mm Hg.  ------------------------------------------------------------------- Left atrium: The atrium was mildly dilated.  ------------------------------------------------------------------- Right ventricle: The cavity size was normal. Systolic function was normal.  ------------------------------------------------------------------- Pulmonic valve:  Structurally normal valve.  Cusp separation was normal. Doppler: Transvalvular velocity was within the normal range. There was no regurgitation.  ------------------------------------------------------------------- Tricuspid valve:  Doppler: There was no significant regurgitation.  ------------------------------------------------------------------- Pulmonary artery:  No complete TR doppler jet so unable to estimate PA systolic pressure.  ------------------------------------------------------------------- Right atrium: The atrium was normal in size.  ------------------------------------------------------------------- Pericardium: There was no pericardial effusion.  ------------------------------------------------------------------- Systemic veins: Inferior vena cava: The vessel was normal in size. The respirophasic diameter changes were in the normal range (>= 50%), consistent with normal central venous pressure.  ------------------------------------------------------------------- Post procedure conclusions Ascending Aorta:  - Mildly dilated aortic root and ascending aorta.  ------------------------------------------------------------------- Measurements  Left ventricle              Value      Reference LV ID, ED, PLAX chordal         45.7  mm    43 - 52 LV ID, ES, PLAX chordal          26.2  mm    23 - 38 LV fx shortening, PLAX chordal      43   %    >=29 LV PW thickness, ED  14.3  mm    --------- IVS/LV PW ratio, ED           1.27      <=1.3 Stroke volume, 2D            108  ml    --------- Stroke volume/bsa, 2D          50   ml/m^2  --------- LV e&', lateral              6.53  cm/s   --------- LV E/e&', lateral             22.62      --------- LV e&', medial              5.36  cm/s   --------- LV E/e&', medial             27.55      --------- LV e&', average              5.95  cm/s   --------- LV E/e&', average             24.84      ---------  Ventricular septum            Value      Reference IVS thickness, ED            18.2  mm    ---------  LVOT                   Value      Reference LVOT ID, S                25   mm    --------- LVOT area                4.91  cm^2   --------- LVOT ID                 25   mm    --------- LVOT peak velocity, S          101  cm/s   --------- LVOT mean velocity, S          70.1  cm/s   --------- LVOT VTI, S               22   cm    --------- LVOT peak gradient, S          4   mm Hg  --------- Stroke volume (SV), LVOT DP       108  ml    --------- Stroke index (SV/bsa), LVOT DP      50.4  ml/m^2  ---------  Aortic valve               Value      Reference Aortic valve peak velocity, S      448  cm/s   --------- Aortic valve mean velocity, S      317  cm/s   --------- Aortic valve VTI, S           93.3  cm    --------- Aortic mean gradient, S         46    mm Hg  --------- Aortic peak gradient, S         80   mm Hg  --------- VTI ratio, LVOT/AV            0.24      --------- Aortic valve area, VTI  1.16  cm^2   --------- Aortic valve area/bsa, VTI        0.54  cm^2/m^2 --------- Velocity ratio, peak, LVOT/AV      0.23      --------- Aortic valve area, peak velocity     1.11  cm^2   --------- Aortic valve area/bsa, peak       0.52  cm^2/m^2 --------- velocity Velocity ratio, mean, LVOT/AV      0.22      --------- Aortic valve area, mean velocity     1.09  cm^2   --------- Aortic valve area/bsa, mean       0.51  cm^2/m^2 --------- velocity Aortic regurg pressure half-time     294  ms    ---------  Aorta                  Value      Reference Aortic root ID, ED            46   mm    --------- Ascending aorta ID, A-P, S        42   mm    ---------  Left atrium               Value      Reference LA ID, A-P, ES              45   mm    --------- LA ID/bsa, A-P              2.1  cm/m^2  <=2.2 LA volume, S               71   ml    --------- LA volume/bsa, S             33.1  ml/m^2  --------- LA volume, ES, 1-p A4C          65   ml    --------- LA volume/bsa, ES, 1-p A4C        30.3  ml/m^2  --------- LA volume, ES, 1-p A2C          75   ml    --------- LA volume/bsa, ES, 1-p A2C        35   ml/m^2  ---------  Mitral valve               Value      Reference Mitral E-wave peak velocity       147.68 cm/s   --------- Mitral A-wave peak velocity       185  cm/s   --------- Mitral deceleration slope        482.1 cm/s^2  --------- Mitral deceleration time     (H)    306  ms    150 - 230 Mitral pressure half-time        89   ms    --------- Mitral peak gradient, D         9   mm Hg  --------- Mitral E/A ratio, peak          0.8       --------- Mitral valve area, PHT, DP        2.48  cm^2   --------- Mitral valve area/bsa, PHT, DP      1.16  cm^2/m^2 ---------  Systemic veins              Value      Reference Estimated CVP  3   mm Hg  ---------  Right ventricle             Value      Reference RV s&', lateral, S            13.2  cm/s   ---------  Legend: (L) and (H) mark values outside specified reference range.  ------------------------------------------------------------------- Prepared and Electronically Authenticated by  Loralie Champagne, M.D. 2016-10-19T16:03:42     Belva Crome, MD (Primary)      Procedures    Intravascular Pressure Wire/FFR Study   Right/Left Heart Cath and Coronary Angiography    Conclusion    1. Prox RCA lesion, 50% stenosed. 2. 1st Mrg lesion, 95% stenosed. 3. Prox Cx to Dist Cx lesion, 25% stenosed. 4. Prox LAD to Mid LAD lesion, 65% stenosed. 5. Dist LAD lesion, 50% stenosed.   Severe aortic stenosis with papillated area valve area of 0.65 cm  Heavy aortic valve calcification and peak to peak aortic valve gradient of 45 mmHg with a mean gradient of 40 mmHg  Normal right heart pressures  Normal left ventricular systolic function and hemodynamics  Coronary artery disease with 50% proximal RCA 95% branch of the first marginal. The vessel is too small to bypass and I did not feel percutaneous intervention was needed in absence of symptoms. Intermediate stenosis in the proximal to mid LAD with an FFR of 0.86 documenting that the lesion is not hemodynamically significant. There is also 50% distal LAD narrowing. The first septal  perforator contains 90% ostial narrowing.    RECOMMENDATIONS:   Heart valve team approach for consideration of aortic valve replacement. He is low risk for surgery and therefore I assume that he will have open surgical valve replacement.  I don't believe he needs coronary grafting as his coronary lesions are not critical.     Indications    Coronary artery disease involving native coronary artery of native heart with angina pectoris (HCC) [I25.119 (ICD-10-CM)]   Aortic stenosis [I35.0 (ICD-10-CM)]    Technique and Indications    The right heart cath was performed via the right antecubital vein. An 18-gauge Angiocath started in the holding area was exchanged for a 5 French brachial sheath using a modified Seldinger technique. Right heart pressures were then recorded using a 5 French balloon tip catheter. A main pulmonary artery oximetry sample was obtained. A pulmonary capillary wedge was recorded and documented to be normal.. The right heart catheter was then removed by pullback while observing pressures.  The right radial area was sterilely prepped and draped. Intravenous sedation with Versed and fentanyl was administered. 1% Xylocaine was infiltrated to achieve local analgesia. A double wall stick with an Angiocath was utilized to obtain intra-arterial access. The modified Seldinger technique was used to place a 61F " Slender" sheath in the right radial artery. Coronary angiography was done using 5 F catheters. Right coronary angiography was performed with a JR4. We then used a straight tipped 0.035 cm wire to cross into the left ventricle using the JR4 diagnostic catheter. Left ventriculography was done using the JR 4 catheter and hand injection. Left coronary angiography was performed with a JL 5.0 cm.   We noted tight disease in a small branch of the first obtuse marginal, 40-50% proximal RCA, and 50-65% proximal to mid LAD. The LAD was of questionable angiographic  significance. Therefore physiologic/hemodynamic testing was felt necessary.  FFR was performed using a 6 [place] XB LAD guide catheter. Adequate heparinization (a total of 10,000  units) lead to ACT greater than 250 seconds. We then performed FFR using adenosine pharmacologic stress using the Comet pressure wire. The most FFR was 0.86, making the LAD lesion not hemodynamically significant.  A wrist and was used for right radial hemostasis. The venous sheath will be pulled later when the ACT is less than 200 seconds. Plan is for the patient to be discharged later today.Estimated blood loss <50 mL. There were no immediate complications during the procedure.    Coronary Findings    Dominance: Right   Left Anterior Descending   . Prox LAD to Mid LAD lesion, 65% stenosed. Tubular.   Jorene Minors LAD lesion, 50% stenosed. The lesion is type non-C.   Marland Kitchen First Diagonal Branch   The vessel is small in size.     Left Circumflex   . Prox Cx to Dist Cx lesion, 25% stenosed.   . First Obtuse Marginal Branch   The vessel is small in size.   . 1st Mrg lesion, 95% stenosed. The lesion is type non-C Tubular.     Right Coronary Artery   . Prox RCA lesion, 50% stenosed. The lesion is type C Diffuse tubular eccentric.       Right Heart Pressures LV EDP is normal.    Wall Motion                 Left Heart    Left Ventricle The left ventricular ejection fraction is 55-65% by visual estimate.   Aortic Valve There is severe aortic valve stenosis. The aortic valve is calcified. There is restricted aortic valve motion.    Coronary Diagrams    Diagnostic Diagram            Implants    Name ID Temporary Type Supply   No information to display    PACS Images    Show images for Cardiac catheterization     Link to Procedure Log    Procedure Log      Hemo Data       Most Recent Value    Fick Cardiac Output  4.46 L/min   Fick Cardiac Output Index  2.06 (L/min)/BSA   Aortic Mean Gradient  39.7 mmHg   Aortic Peak Gradient  45 mmHg   Aortic Valve Area  0.65   Aortic Value Area Index  0.3 cm2/BSA   RA A Wave  6 mmHg   RA V Wave  7 mmHg   RA Mean  8 mmHg   RV Systolic Pressure  28 mmHg   RV Diastolic Pressure  3 mmHg   RV EDP  6 mmHg   PA Systolic Pressure  31 mmHg   PA Diastolic Pressure  11 mmHg   PA Mean  22 mmHg   PW A Wave  18 mmHg   PW V Wave  10 mmHg   PW Mean  11 mmHg   AO Systolic Pressure  91 mmHg   AO Diastolic Pressure  55 mmHg   AO Mean  72 mmHg   LV Systolic Pressure  [id number] mmHg   LV Diastolic Pressure  6 mmHg   LV EDP  9 mmHg   Arterial Occlusion Pressure Extended Systolic Pressure  90 mmHg   Arterial Occlusion Pressure Extended Diastolic Pressure  52 mmHg   Arterial Occlusion Pressure Extended Mean Pressure  69 mmHg   Left Ventricular Apex Extended Systolic Pressure  [id number] mmHg   Left Ventricular Apex Extended Diastolic Pressure  1 mmHg   Left Ventricular  Apex Extended EDP Pressure  7 mmHg   QP/QS  1   TPVR Index  10.71 HRUI   TSVR Index  35.05 HRUI   PVR SVR Ratio  0.17   TPVR/TSVR Ratio  0.31     Impression:  He has stage D severe, symptomatic aortic stenosis with progressive exertional fatigue, shortness of breath and dizziness. I have personally reviewed his echo and cath. He has a tri-leaflet aortic valve with severely calcified and poorly mobile leaflets. There is mild aortic root and ascending aortic dilation but I don't think it is large enough to warrant replacement. The mitral valve leaflets are mildly calcified and thickened but the peak gradient is only 9 mm Hg and the valve leaflets appear to move well with a valve area of 2.48 cm2. His cath shows non-obstructive  stenoses in the proximal RCA and proximal to mid LAD that is insignificant by FFR measured at 0.86. I agree with Dr. Tamala Julian that CABG is not needed and these lesions would be amenable to PCI in the future if they progressed. I have recommended a tissue valve given his age of 67 and history of some GI blood loss on aspirin.  I discussed the operative procedure with the patient and his wife including alternatives, benefits and risks; including but not limited to bleeding, blood transfusion, infection, stroke, myocardial infarction, graft failure, heart block requiring a permanent pacemaker, organ dysfunction, and death. Zachary [name] understands and agrees to proceed.   Plan:  AVR using a tissue valve on Thursday 03/25/2015. Gaye Pollack, MD [facility] [phone]

## 2015-03-26 ENCOUNTER — Encounter (HOSPITAL_COMMUNITY): Payer: Self-pay | Admitting: Surgery

## 2015-03-26 ENCOUNTER — Inpatient Hospital Stay (HOSPITAL_COMMUNITY): Payer: Medicare Other

## 2015-03-26 LAB — CBC
HCT: 32.4 % — ABNORMAL LOW (ref 39.0–52.0)
HCT: 32.9 % — ABNORMAL LOW (ref 39.0–52.0)
Hemoglobin: 10.6 g/dL — ABNORMAL LOW (ref 13.0–17.0)
Hemoglobin: 10.7 g/dL — ABNORMAL LOW (ref 13.0–17.0)
MCH: 32.6 pg (ref 26.0–34.0)
MCH: 32.3 pg (ref 26.0–34.0)
MCHC: 32.7 g/dL (ref 30.0–36.0)
MCHC: 32.5 g/dL (ref 30.0–36.0)
MCV: 99.7 fL (ref 78.0–100.0)
MCV: 99.4 fL (ref 78.0–100.0)
PLATELETS: 171 10*3/uL (ref 150–400)
PLATELETS: 134 10*3/uL — AB (ref 150–400)
RBC: 3.25 MIL/uL — AB (ref 4.22–5.81)
RBC: 3.31 MIL/uL — AB (ref 4.22–5.81)
RDW: 13.3 % (ref 11.5–15.5)
RDW: 13.3 % (ref 11.5–15.5)
WBC: 13.2 10*3/uL — AB (ref 4.0–10.5)
WBC: 11.7 10*3/uL — AB (ref 4.0–10.5)

## 2015-03-26 LAB — BASIC METABOLIC PANEL
ANION GAP: 8 (ref 5–15)
BUN: 19 mg/dL (ref 6–20)
CALCIUM: 8.2 mg/dL — AB (ref 8.9–10.3)
CO2: 24 mmol/L (ref 22–32)
Chloride: 106 mmol/L (ref 101–111)
Creatinine, Ser: 1.31 mg/dL — ABNORMAL HIGH (ref 0.61–1.24)
GFR, EST NON AFRICAN AMERICAN: 54 mL/min — AB (ref >60)
Glucose, Bld: 136 mg/dL — ABNORMAL HIGH (ref 65–99)
POTASSIUM: 4.2 mmol/L (ref 3.5–5.1)
Sodium: 138 mmol/L (ref 135–145)

## 2015-03-26 LAB — GLUCOSE, CAPILLARY
GLUCOSE-CAPILLARY: 159 mg/dL — AB (ref 65–99)
GLUCOSE-CAPILLARY: 131 mg/dL — AB (ref 65–99)
GLUCOSE-CAPILLARY: 134 mg/dL — AB (ref 65–99)
GLUCOSE-CAPILLARY: 126 mg/dL — AB (ref 65–99)
Glucose-Capillary: 134 mg/dL — ABNORMAL HIGH (ref 65–99)
Glucose-Capillary: 142 mg/dL — ABNORMAL HIGH (ref 65–99)

## 2015-03-26 LAB — POCT I-STAT, CHEM 8
BUN: 18 mg/dL (ref 6–20)
CALCIUM ION: 1.18 mmol/L (ref 1.13–1.30)
CHLORIDE: 96 mmol/L — AB (ref 101–111)
Creatinine, Ser: 1.4 mg/dL — ABNORMAL HIGH (ref 0.61–1.24)
Glucose, Bld: 136 mg/dL — ABNORMAL HIGH (ref 65–99)
HCT: 32 % — ABNORMAL LOW (ref 39.0–52.0)
Hemoglobin: 10.9 g/dL — ABNORMAL LOW (ref 13.0–17.0)
Potassium: 4 mmol/L (ref 3.5–5.1)
SODIUM: 137 mmol/L (ref 135–145)
TCO2: 27 mmol/L (ref 0–100)

## 2015-03-26 LAB — MAGNESIUM
MAGNESIUM: 2.4 mg/dL (ref 1.7–2.4)
MAGNESIUM: 2.2 mg/dL (ref 1.7–2.4)

## 2015-03-26 LAB — CREATININE, SERUM
Creatinine, Ser: 1.4 mg/dL — ABNORMAL HIGH (ref 0.61–1.24)
GFR, EST AFRICAN AMERICAN: 58 mL/min — AB (ref >60)
GFR, EST NON AFRICAN AMERICAN: 50 mL/min — AB (ref >60)

## 2015-03-26 MED ORDER — INSULIN ASPART 100 UNIT/ML ~~LOC~~ SOLN
0.0000 [IU] | SUBCUTANEOUS | Status: DC
  Administered 2015-03-26 – 2015-03-27 (×4): 2 [IU] via SUBCUTANEOUS

## 2015-03-26 MED ORDER — FUROSEMIDE 10 MG/ML IJ SOLN
40.0000 mg | Freq: Once | INTRAMUSCULAR | Status: AC
  Administered 2015-03-26: 40 mg via INTRAVENOUS
  Filled 2015-03-26: qty 4

## 2015-03-26 MED ORDER — POTASSIUM CHLORIDE CRYS ER 20 MEQ PO TBCR
40.0000 meq | EXTENDED_RELEASE_TABLET | Freq: Once | ORAL | Status: AC
  Administered 2015-03-26: 40 meq via ORAL
  Filled 2015-03-26: qty 2

## 2015-03-26 MED ORDER — FUROSEMIDE 10 MG/ML IJ SOLN
40.0000 mg | Freq: Once | INTRAMUSCULAR | Status: AC
  Administered 2015-03-26: 40 mg via INTRAVENOUS

## 2015-03-26 MED ORDER — CHLORHEXIDINE GLUCONATE CLOTH 2 % EX PADS
6.0000 | MEDICATED_PAD | Freq: Every day | CUTANEOUS | Status: DC
  Administered 2015-03-28: 6 via TOPICAL

## 2015-03-26 NOTE — Progress Notes (Signed)
Nutrition Follow-up  DOCUMENTATION CODES:   Not applicable  INTERVENTION:   -RD will follow for diet advancement, PO intake, and supplement diet as appropriate  NUTRITION DIAGNOSIS:   Inadequate oral intake related to inability to eat as evidenced by NPO status.  Resolved  GOAL:   Patient will meet greater than or equal to 90% of their needs  Progressing  MONITOR:   PO intake, Diet advancement, Labs, Weight trends, Skin, I & O's  REASON FOR ASSESSMENT:   Ventilator    ASSESSMENT:   69 yo Male with severe, symptomatic aortic stenosis with progressive exertional fatigue, shortness of breath and dizziness; presented for surgical intervention.  S/p Procedure(s) (LRB) 03/25/15: AORTIC VALVE REPLACEMENT (AVR) (N/A) TRANSESOPHAGEAL ECHOCARDIOGRAM (TEE) (N/A)  Pt extubated on 03/25/15. Nutritional needs re-estimate to reflect change in status.   Pt sleeping soundly at time of visit. Pt tolerated clear liquid diet well this morning. Per staff, pt has transitioned off all drips and may transfer to SDU later on today.   RD will continue to follow for diet advancement and supplement as appropriate.  Labs reviewed: CBGS: 131-134.   Diet Order:  Diet clear liquid Room service appropriate?: Yes; Fluid consistency:: Thin  Skin:  Reviewed, no issues  Last BM:  PTA  Height:   Ht Readings from Last 1 Encounters:  03/25/15 5' 11.5" (1.816 m)    Weight:   Wt Readings from Last 1 Encounters:  03/26/15 213 lb 6.5 oz (96.8 kg)    Ideal Body Weight:  78.1 kg  BMI:  Body mass index is 29.35 kg/(m^2).  Estimated Nutritional Needs:   Kcal:  1900-2100  Protein:  95-110 grams  Fluid:  1.9-2.1 L  EDUCATION NEEDS:   No education needs identified at this time  Michal Strzelecki A. Jimmye Norman, RD, LDN, CDE Pager: [phone] After hours Pager: [phone]

## 2015-03-26 NOTE — Discharge Summary (Signed)
[address]       [place] [address]             [phone]            Discharge Summary  Name: Zachary [name] DOB: 05-07-1945 69 y.o. MRN: [id number]   Admission Date: 03/25/2015 Discharge Date: 03/30/2015  Admitting Diagnosis: Severe aortic stenosis   Discharge Diagnosis:  Severe aortic stenosis Expected postoperative blood loss anemia  Past Medical History  Diagnosis Date  . HTN (hypertension)   . Erectile dysfunction   . Hypercholesteremia   . GERD (gastroesophageal reflux disease)   . Back pain   . Aortic valvar stenosis   . Urinary frequency   . Arthritis      Procedures: AORTIC VALVE REPLACEMENT (27 mm [facility] Ease pericardial tissue valve) - 03/25/2015    HPI:  The patient is a 69 y.o. male with hypertension, hyperlipidemia and known aortic stenosis followed by Dr. Tamala Julian. His echo in 03/2013 showed a mean gradient of 28 mm Hg consistent with moderate AS. He now presents with progressive exertional fatigue and a couple episodes where he felt like he might pass out if he did not stop what he was doing. He had an echo on 02/10/2015 that showed progression to severe AS with a mean gradient of 49 mm Hg and an AVA of 1.08 cm2. His LVEF has remained normal at 60-65% with moderate LVH. The aortic root was measured at 39 mm Hg and the ascending aorta at 42 mm Hg.   The patient was referred to Dr. Cyndia Bent for surgical consideration. All his studies were reviewed.  He has a tri-leaflet aortic valve with severely calcified and poorly mobile leaflets. There is mild aortic root and ascending aortic dilation but I don't think it is large enough to warrant replacement. The mitral valve leaflets are mildly calcified and thickened but the peak gradient is only 9 mm Hg and the valve leaflets appear to move well with a valve area of 2.48 cm2. 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  Dr. Cyndia Bent agreed with Dr. Thompson Caul assessment that CABG is not needed and these lesions would be amenable to PCI in the future if they progressed. He recommended proceeding with aortic valve replacement at this time using a tissue valve, given his age of 26 and history of some GI blood loss on aspirin.All risks, benefits and alternatives of surgery were explained in detail, and the patient agreed to proceed.    Hospital Course:  The patient was admitted to [facility] on 03/25/2015. The patient was taken to the operating room and underwent the above procedure.    The postoperative course has been uneventful.  The patient remained hemodynamically stable and in sinus rhythm. He had a mild expected postop blood loss anemia which did not require transfusion. He did have a slight elevation of his creatinine which was treated conservatively and did trend back to baseline. He also had a brief episode of rate controlled atrial flutter which spontaneously converted to sinus rhythm.  His beta bklocker dose was titrated accordingly. He was able to be transferred from ICU to stepdown on postop day 3.   Overall, the patient is progressing well.  Incisions are healing well.   Blood pressures have remained stable and he is maintaining sinus rhythm. He is ambulating in the halls without difficulty and is  tolerating a regular diet. He has been mildly volume overloaded and was started on Lasix, to which he has responded well.  The patient is presently medically stable on today's date for discharge home.   Recent vital signs:  Filed Vitals:   03/29/15 2028 03/30/15 0540  BP: 104/83 127/66  Pulse: 95 87  Temp: 98.7 F (37.1 C) 98.1 F (36.7 C)  Resp:  18    Recent laboratory studies:  CBC:  Recent Labs  03/28/15 0235  WBC 10.9*  HGB 10.3*  HCT 31.6*  PLT 129*   BMET:   Recent Labs  03/28/15 0235  NA 134*  K 4.0  CL 98*  CO2 30  GLUCOSE 138*  BUN 17  CREATININE 1.23  CALCIUM  8.4*    PT/INR: No results for input(s): LABPROT, INR in the last 72 hours.   Discharge Medications:     Medication List    STOP taking these medications        lisinopril 10 MG tablet  Commonly known as:  PRINIVIL,ZESTRIL      TAKE these medications        acetaminophen 500 MG tablet  Commonly known as:  TYLENOL  Take 500 mg by mouth every 6 (six) hours as needed for mild pain.     aspirin 325 MG EC tablet  Take 1 tablet (325 mg total) by mouth daily.     atorvastatin 40 MG tablet  Commonly known as:  LIPITOR  Take 40 mg by mouth daily.     metoprolol tartrate 25 MG tablet  Commonly known as:  LOPRESSOR  Take 1 tablet (25 mg total) by mouth 2 (two) times daily.     oxyCODONE 5 MG immediate release tablet  Commonly known as:  Oxy IR/ROXICODONE  Take 1-2 tablets (5-10 mg total) by mouth every 3 (three) hours as needed for severe pain.     ranitidine 150 MG capsule  Commonly known as:  ZANTAC  Take 150 mg by mouth daily.     traMADol 50 MG tablet  Commonly known as:  ULTRAM  Take 1-2 tablets (50-100 mg total) by mouth every 4 (four) hours as needed for moderate pain.         Discharge Instructions:  The patient is to refrain from driving, heavy lifting or strenuous activity.  May shower daily and clean incisions with soap and water.  May resume regular diet.    Follow Up: Follow-up Information    Follow up with Gaye Pollack, MD On 04/28/2015.   Specialty:  Cardiothoracic Surgery   Why:  Have a chest x-ray at [place] at 8:30, then see MD at 9:30.   Contact information:   [place] [address] [phone]       Follow up with Eileen Stanford, PA-C On 04/15/2015.   Specialties:  Cardiology, Radiology   Why:  appointment is at 8:30   Contact information:   [place] [address] [phone]       Follow up with [facility] In 1 week.    Specialty:  Cardiothoracic Surgery   Why:  For suture removal, office will contact you with appointment   Contact information:   [place], [place] [phone]       The patient has been discharged on:  1.Beta Blocker: Yes [ x ]  No [ ]   If No,  reason:    2.Ace Inhibitor/ARB: Yes [  ]  No [ x ]  If No, reason: Low systolic BPs and elevated creatinine   3.Statin: Yes [ x ]  No [ ]   If No, reason:    4.Ecasa: Yes [ x ]  No [ ]   If No, reason:  Follow-up Information    Follow up with Gaye Pollack, MD On 04/28/2015.   Specialty:  Cardiothoracic Surgery   Why:  Have a chest x-ray at [place] at 8:30, then see MD at 9:30.   Contact information:   [place] [address] [phone]       Follow up with Eileen Stanford, PA-C On 04/15/2015.   Specialties:  Cardiology, Radiology   Why:  appointment is at 8:30   Contact information:   [place] [address] [phone]       Follow up with [facility] In 1 week.   Specialty:  Cardiothoracic Surgery   Why:  For suture removal, office will contact you with appointment   Contact information:   [place], [place] 03/30/2015, 8:21 AM

## 2015-03-26 NOTE — Progress Notes (Addendum)
[facility] DAILY ICU PROGRESS NOTE                   [address]            [place] [address]          [phone]   1 Day Post-Op Procedure(s) (LRB): AORTIC VALVE REPLACEMENT (AVR) (N/A) TRANSESOPHAGEAL ECHOCARDIOGRAM (TEE) (N/A)  Total Length of Stay:  LOS: 1 day   Subjective:  Mr. Teinert has no complaints.  He denies pain and overall states he is doing okay.  He is starting a clear diet this morning.   Objective: Vital signs in last 24 hours: Temp:  [96.8 F (36 C)-100 F (37.8 C)] 98.8 F (37.1 C) (12/02 0900) Pulse Rate:  [70-94] 76 (12/02 0900) Cardiac Rhythm:  [-] Normal sinus rhythm (12/02 0800) Resp:  [11-26] 20 (12/02 0900) BP: (89-128)/(44-91) 128/73 mmHg (12/02 0900) SpO2:  [89 %-100 %] 96 % (12/02 0900) Arterial Line BP: (79-138)/(43-78) 138/61 mmHg (12/02 0900) FiO2 (%):  [40 %-50 %] 40 % (12/01 1636) Weight:  [207 lb 0.2 oz (93.9 kg)-213 lb 6.5 oz (96.8 kg)] 213 lb 6.5 oz (96.8 kg) (12/02 0500)  Filed Weights   03/25/15 0554 03/25/15 1323 03/26/15 0500  Weight: 207 lb 1.6 oz (93.94 kg) 207 lb 0.2 oz (93.9 kg) 213 lb 6.5 oz (96.8 kg)    Weight change: -1.4 oz (-0.04 kg)   Hemodynamic parameters for last 24 hours: PAP: (27-54)/(14-25) 39/21 mmHg CO:  [3.8 L/min-7.5 L/min] 5.8 L/min CI:  [1.8 L/min/m2-3.5 L/min/m2] 2.7 L/min/m2  Intake/Output from previous day: 12/01 0701 - 12/02 0700 In: 4781.8 [I.V.:3181.8; Blood:450; IV [id number] Out: 2745 [Urine:2385; Chest Tube:360]  Intake/Output this shift: Total I/O In: 130 [I.V.:80; IV Piggyback:50] Out: 110 [Urine:90; Chest Tube:20]  Current Meds: Scheduled Meds: . acetaminophen  1,000 mg Oral 4 times per day   Or  . acetaminophen (TYLENOL) oral liquid 160 mg/5 mL  1,000 mg Per Tube 4 times per day  . antiseptic oral rinse  7 mL Mouth Rinse BID  . aspirin EC  325 mg Oral Daily   Or  . aspirin  324 mg Per Tube Daily  . atorvastatin  40 mg Oral q1800  . bisacodyl  10 mg Oral Daily     Or  . bisacodyl  10 mg Rectal Daily  . cefUROXime (ZINACEF)  IV  1.5 g Intravenous Q12H  . chlorhexidine gluconate  15 mL Mouth/Throat BID  . Chlorhexidine Gluconate Cloth  6 each Topical Q0600  . docusate sodium  200 mg Oral Daily  . famotidine (PEPCID) IV  20 mg Intravenous Q12H  . furosemide  40 mg Intravenous Once  . insulin aspart  0-24 Units Subcutaneous 6 times per day  . insulin aspart  0-24 Units Subcutaneous 6 times per day  . metoprolol tartrate  12.5 mg Oral BID   Or  . metoprolol tartrate  12.5 mg Per Tube BID  . mupirocin ointment   Nasal BID  . [START ON 03/27/2015] pantoprazole  40 mg Oral Daily  . sodium chloride  3 mL Intravenous Q12H   Continuous Infusions: . sodium chloride 20 mL/hr at 03/25/15 1900  . sodium chloride    . sodium chloride    . dexmedetomidine Stopped (03/25/15 1506)  . lactated ringers    . lactated ringers 20 mL/hr at 03/25/15 1900   PRN Meds:.sodium chloride, lactated ringers, metoprolol, midazolam, morphine injection, ondansetron (ZOFRAN) IV, oxyCODONE, sodium chloride, traMADol  General appearance: alert, cooperative and no distress Heart: regular rate and rhythm Lungs: diminished breath sounds left base Abdomen: soft, non-tender; bowel sounds normal; no masses,  no organomegaly Extremities: edema trace Wound: clean and dry  Lab Results: CBC: Recent Labs  03/25/15 1800 03/26/15 0453  WBC 14.1* 13.2*  HGB 12.0* 10.6*  HCT 35.9* 32.4*  PLT 174 171   BMET:  Recent Labs  03/24/15 0850  03/25/15 1757 03/25/15 1800 03/26/15 0453  NA 138  < > 138  --  138  K 4.2  < > 4.4  --  4.2  CL 107  < > 106  --  106  CO2 23  --   --   --  24  GLUCOSE 118*  < > 135*  --  136*  BUN 18  < > 20  --  19  CREATININE 1.49*  < > 1.40* 1.48* 1.31*  CALCIUM 9.5  --   --   --  8.2*  < > = values in this interval not displayed.  PT/INR:  Recent Labs  03/25/15 1220  LABPROT 16.4*  INR 1.31   Radiology: Dg Chest Port 1 View  03/26/2015   CLINICAL DATA:  Status post aortic valve replacement for aortic stenosis EXAM: PORTABLE CHEST 1 VIEW COMPARISON:  March 25, 2015 FINDINGS: Endotracheal tube and nasogastric tube have been removed. Swan-Ganz catheter extends into the proximal right main pulmonary artery. The catheter loops on itself with the tip in the proximal right main pulmonary artery directed toward the main pulmonary outflow tract, stable from 1 day prior. There is a mediastinal drain and a left chest drain. Temporary pacemaker wires are attached to the right heart. There is no edema or consolidation. Heart is enlarged with pulmonary vascularity within normal limits. No adenopathy. IMPRESSION: Endotracheal tube and nasogastric tube removed. Other tubes and catheters appear unchanged. Note that the distal aspect of the Swan-Ganz catheter loops upon itself in the proximal right main pulmonary artery, stable from 1 day prior. No pneumothorax. No edema or consolidation. Stable cardiomegaly. Electronically Signed   By: Lowella Grip III M.D.   On: 03/26/2015 07:46   Dg Chest Port 1 View  03/25/2015  CLINICAL DATA:  Aortic valve replacement EXAM: PORTABLE CHEST 1 VIEW COMPARISON:  03/24/2015 FINDINGS: Endotracheal tube in good position. NG tube in the stomach. Mediastinal drain noted. No chest tube. Aortic valve in satisfactory position. Right jugular Swan-Ganz catheter enters the right pulmonary artery. The catheter is coiled in the right pulmonary artery with the tip directed towards the main pulmonary artery. Left lower lobe atelectasis and effusion. IMPRESSION: Left lower lobe atelectasis and effusion.  No pneumothorax Swan-Ganz catheter coiled in the right pulmonary artery. Electronically Signed   By: Franchot Gallo M.D.   On: 03/25/2015 13:26     Assessment/Plan: S/P Procedure(s) (LRB): AORTIC VALVE REPLACEMENT (AVR) (N/A) TRANSESOPHAGEAL ECHOCARDIOGRAM (TEE) (N/A)  1. CV- maintaining NSR, off all drips- will start low dose  beta blocker 2. Pulm- wean oxygen as tolerated, currently on 6L, CXR with atelectasis/effusion Left base, continue IS 3. Renal- creatinine WNL, mild hypervolemia, will give IV Lasix today 4. Expected post operative blood loss anemia, mild Hgb is 10.6 5. CBGs- have been controlled, not on an insulin drip, will hold off on levemir, continue SSIP 6. Dispo- patient doing well, off all drips, maintaining NSR, POD #1 progression orders, possibly transfer to step down later today     Ellwood Handler 03/26/2015 9:17 AM   Chart reviewed,  patient examined, agree with above. He is doing well. Continue diuresis and plan transfer to stepdown tomorrow.

## 2015-03-27 ENCOUNTER — Inpatient Hospital Stay (HOSPITAL_COMMUNITY): Payer: Medicare Other

## 2015-03-27 LAB — BASIC METABOLIC PANEL
Anion gap: 4 — ABNORMAL LOW (ref 5–15)
BUN: 18 mg/dL (ref 6–20)
CALCIUM: 8.3 mg/dL — AB (ref 8.9–10.3)
CHLORIDE: 100 mmol/L — AB (ref 101–111)
CO2: 30 mmol/L (ref 22–32)
CREATININE: 1.37 mg/dL — AB (ref 0.61–1.24)
GFR calc non Af Amer: 51 mL/min — ABNORMAL LOW (ref >60)
GFR, EST AFRICAN AMERICAN: 59 mL/min — AB (ref >60)
GLUCOSE: 129 mg/dL — AB (ref 65–99)
Potassium: 4.5 mmol/L (ref 3.5–5.1)
Sodium: 134 mmol/L — ABNORMAL LOW (ref 135–145)

## 2015-03-27 LAB — CBC
HCT: 32.4 % — ABNORMAL LOW (ref 39.0–52.0)
Hemoglobin: 10.3 g/dL — ABNORMAL LOW (ref 13.0–17.0)
MCH: 32.1 pg (ref 26.0–34.0)
MCHC: 31.8 g/dL (ref 30.0–36.0)
MCV: 100.9 fL — AB (ref 78.0–100.0)
PLATELETS: 125 10*3/uL — AB (ref 150–400)
RBC: 3.21 MIL/uL — AB (ref 4.22–5.81)
RDW: 13.4 % (ref 11.5–15.5)
WBC: 12.5 10*3/uL — ABNORMAL HIGH (ref 4.0–10.5)

## 2015-03-27 LAB — GLUCOSE, CAPILLARY
GLUCOSE-CAPILLARY: 134 mg/dL — AB (ref 65–99)
Glucose-Capillary: 111 mg/dL — ABNORMAL HIGH (ref 65–99)
Glucose-Capillary: 138 mg/dL — ABNORMAL HIGH (ref 65–99)
Glucose-Capillary: 136 mg/dL — ABNORMAL HIGH (ref 65–99)

## 2015-03-27 MED ORDER — SODIUM CHLORIDE 0.9 % IJ SOLN: 3.0000 mL | INTRAMUSCULAR | Status: DC | PRN

## 2015-03-27 MED ORDER — POTASSIUM CHLORIDE CRYS ER 20 MEQ PO TBCR
20.0000 meq | EXTENDED_RELEASE_TABLET | Freq: Two times a day (BID) | ORAL | Status: DC
Start: 2015-03-28 — End: 2015-03-29
  Administered 2015-03-28 – 2015-03-29 (×3): 20 meq via ORAL
  Filled 2015-03-27 (×3): qty 1

## 2015-03-27 MED ORDER — SODIUM CHLORIDE 0.9 % IJ SOLN
3.0000 mL | Freq: Two times a day (BID) | INTRAMUSCULAR | Status: DC
  Administered 2015-03-27 – 2015-03-29 (×4): 3 mL via INTRAVENOUS

## 2015-03-27 MED ORDER — FUROSEMIDE 40 MG PO TABS
40.0000 mg | ORAL_TABLET | Freq: Two times a day (BID) | ORAL | Status: DC
  Administered 2015-03-27 – 2015-03-29 (×4): 40 mg via ORAL
  Filled 2015-03-27 (×4): qty 1

## 2015-03-27 MED ORDER — MOVING RIGHT ALONG BOOK
Freq: Once | Status: AC
  Administered 2015-03-27: 12:00:00
  Filled 2015-03-27: qty 1

## 2015-03-27 MED ORDER — SODIUM CHLORIDE 0.9 % IV SOLN: 250.0000 mL | INTRAVENOUS | Status: DC | PRN

## 2015-03-27 NOTE — Progress Notes (Signed)
[facility] BRIEF SICU PROGRESS NOTE  2 Days Post-Op  S/P Procedure(s) (LRB): AORTIC VALVE REPLACEMENT (AVR) (N/A) TRANSESOPHAGEAL ECHOCARDIOGRAM (TEE) (N/A)   Stable day O2 sats 90-96% on 4 L/min  Plan: Awaiting bed on step down unit  Rexene Alberts, MD 03/27/2015 5:13 PM

## 2015-03-27 NOTE — Progress Notes (Signed)
[address]       [place] [address]             [phone]        CARDIOTHORACIC SURGERY PROGRESS NOTE   R2 Days Post-Op Procedure(s) (LRB): AORTIC VALVE REPLACEMENT (AVR) (N/A) TRANSESOPHAGEAL ECHOCARDIOGRAM (TEE) (N/A)  Subjective: Looks good and feels well.  Mild soreness in chest.  O2 sats drop when patient is sleeping  Objective: Vital signs: BP Readings from Last 1 Encounters:  03/27/15 112/64   Pulse Readings from Last 1 Encounters:  03/27/15 77   Resp Readings from Last 1 Encounters:  03/27/15 14   Temp Readings from Last 1 Encounters:  03/27/15 97.8 F (36.6 C) Oral    Hemodynamics:    Physical Exam:  Rhythm:   sinus  Breath sounds: Clear but diminished at bases L>R  Heart sounds:  RRR  Incisions:  Dressing dry, intact  Abdomen:  Soft, non-distended, non-tender  Extremities:  Warm, well-perfused    Intake/Output from previous day: 12/02 0701 - 12/03 0700 In: 1880 [P.O.:1320; I.V.:460; IV Piggyback:100] Out: 3885 [Urine:3865; Chest Tube:20] Intake/Output this shift: Total I/O In: 470 [P.O.:420; IV Piggyback:50] Out: 140 [Urine:140]  Lab Results:  CBC: Recent Labs  03/26/15 1718 03/27/15 0426  WBC 11.7* 12.5*  HGB 10.7* 10.3*  HCT 32.9* 32.4*  PLT 134* 125*    BMET:  Recent Labs  03/26/15 0453 03/26/15 1714 03/26/15 1718 03/27/15 0426  NA 138 137  --  134*  K 4.2 4.0  --  4.5  CL 106 96*  --  100*  CO2 24  --   --  30  GLUCOSE 136* 136*  --  129*  BUN 19 18  --  18  CREATININE 1.31* 1.40* 1.40* 1.37*  CALCIUM 8.2*  --   --  8.3*     PT/INR:   Recent Labs  03/25/15 1220  LABPROT 16.4*  INR 1.31    CBG (last 3)   Recent Labs  03/26/15 2319 03/27/15 0412 03/27/15 0826  GLUCAP 134* 111* 138*    ABG    Component Value Date/Time   PHART 7.385 03/25/2015 1753   PCO2ART 39.4 03/25/2015 1753   PO2ART 62.0* 03/25/2015 1753   HCO3 23.6 03/25/2015 1753   TCO2 27 03/26/2015 1714   ACIDBASEDEF 1.0 03/25/2015 1753   O2SAT 91.0 03/25/2015 1753    CXR: PORTABLE CHEST 1 VIEW  COMPARISON: Radiographs 03/26/2015 and 03/25/2015.  FINDINGS: 0555 hours. Interval removal of the Swan-Ganz catheter and mediastinal drain. Right IJ sheath remains in place. Positioning is more lordotic on the current examination. Allowing for this, the heart size and mediastinal contours are stable status post median sternotomy and aortic valve replacement. Left-greater-than-right basilar atelectasis and probable small bilateral pleural effusions have also not significantly changed. There is no pneumothorax.  IMPRESSION: No significant change in bibasilar atelectasis and small pleural effusions allowing for positional differences. No pneumothorax.   Electronically Signed  By: Richardean Sale M.D.  On: 03/27/2015 09:41   Assessment/Plan: S/P Procedure(s) (LRB): AORTIC VALVE REPLACEMENT (AVR) (N/A) TRANSESOPHAGEAL ECHOCARDIOGRAM (TEE) (N/A)  Overall stable POD2 Maintaining NSR w/ stable BP no drips O2 sats 90-93% on 2 L/min - intermittently drop into 80% when patient sleeping Expected post op atelectasis L>>R, possible L pleural effusion Expected post op acute blood loss anemia, mild, stable Expected post op volume excess, mild, UOP adequate   Mobilize  Pulm toilet  Diuresis  Recheck upright PA+Lat CXR tomorrow  Transfer step down    Rexene Alberts, MD 03/27/2015 11:41 AM

## 2015-03-28 ENCOUNTER — Inpatient Hospital Stay (HOSPITAL_COMMUNITY): Payer: Medicare Other

## 2015-03-28 LAB — CBC
HEMATOCRIT: 31.6 % — AB (ref 39.0–52.0)
HEMOGLOBIN: 10.3 g/dL — AB (ref 13.0–17.0)
MCH: 32.2 pg (ref 26.0–34.0)
MCHC: 32.6 g/dL (ref 30.0–36.0)
MCV: 98.8 fL (ref 78.0–100.0)
Platelets: 129 10*3/uL — ABNORMAL LOW (ref 150–400)
RBC: 3.2 MIL/uL — ABNORMAL LOW (ref 4.22–5.81)
RDW: 13.1 % (ref 11.5–15.5)
WBC: 10.9 10*3/uL — AB (ref 4.0–10.5)

## 2015-03-28 LAB — BASIC METABOLIC PANEL
ANION GAP: 6 (ref 5–15)
BUN: 17 mg/dL (ref 6–20)
CALCIUM: 8.4 mg/dL — AB (ref 8.9–10.3)
CHLORIDE: 98 mmol/L — AB (ref 101–111)
CO2: 30 mmol/L (ref 22–32)
Creatinine, Ser: 1.23 mg/dL (ref 0.61–1.24)
GFR calc non Af Amer: 58 mL/min — ABNORMAL LOW (ref >60)
GLUCOSE: 138 mg/dL — AB (ref 65–99)
Potassium: 4 mmol/L (ref 3.5–5.1)
Sodium: 134 mmol/L — ABNORMAL LOW (ref 135–145)

## 2015-03-28 MED ORDER — METOPROLOL TARTRATE 25 MG PO TABS
25.0000 mg | ORAL_TABLET | Freq: Two times a day (BID) | ORAL | Status: DC
  Administered 2015-03-29 – 2015-03-30 (×4): 25 mg via ORAL
  Filled 2015-03-28 (×4): qty 1

## 2015-03-28 MED ORDER — ENOXAPARIN SODIUM 30 MG/0.3ML ~~LOC~~ SOLN
30.0000 mg | SUBCUTANEOUS | Status: DC
  Administered 2015-03-28 – 2015-03-29 (×2): 30 mg via SUBCUTANEOUS
  Filled 2015-03-28 (×2): qty 0.3

## 2015-03-28 NOTE — Plan of Care (Signed)
Problem: Tissue Perfusion: Goal: Risk factors for ineffective tissue perfusion will decrease Outcome: Completed/Met Date Met:  03/28/15 Pt complying with ordered bil. SCD's and increased early mobility.  Problem: Fluid Volume: Goal: Ability to maintain a balanced intake and output will improve Outcome: Completed/Met Date Met:  03/28/15 Pt diuresing adequately from scheduled Lasix.

## 2015-03-28 NOTE — Progress Notes (Signed)
Patient arrived to the floor. No CP or SOB. VSS. Patient oriented to unit and floor. Wife at bedside. Call bell within reach. Will continue to monitor.   Domingo Dimes RN

## 2015-03-28 NOTE — Care Management Important Message (Signed)
Important Message  Patient Details  Name: SID PERCH MRN: [id number] Date of Birth: Jan 01, 1946   Medicare Important Message Given:  Yes    Nathen May 03/28/2015, 11:16 AM

## 2015-03-28 NOTE — Progress Notes (Signed)
Pt monitor reveals a new onset Afib/flutter with a controlled rate of 84-95, 111/58 (74) and Pt asymptomatic . Dr Roxy Manns notified and Pt status discussed. Orders received to continue to monitor and assess at this time.

## 2015-03-28 NOTE — Progress Notes (Addendum)
      [address]       [place] [address]             [phone]        CARDIOTHORACIC SURGERY PROGRESS NOTE   R3 Days Post-Op Procedure(s) (LRB): AORTIC VALVE REPLACEMENT (AVR) (N/A) TRANSESOPHAGEAL ECHOCARDIOGRAM (TEE) (N/A)  Subjective: Looks very good and feels well.  Ambulated in ICU.  Ate his entire breakfast.  Objective: Vital signs: BP Readings from Last 1 Encounters:  03/28/15 94/70   Pulse Readings from Last 1 Encounters:  03/28/15 49   Resp Readings from Last 1 Encounters:  03/28/15 14   Temp Readings from Last 1 Encounters:  03/28/15 97.8 F (36.6 C) Oral    Hemodynamics:    Physical Exam:  Rhythm:   Currently sinus w/ PAC's - some rate-controlled Aflutter earlier this morning  Breath sounds: clear  Heart sounds:  RRR   Incisions:  Clean and dry  Abdomen:  Soft, non-distended, non-tender  Extremities:  Warm, well-perfused    Intake/Output from previous day: 12/03 0701 - 12/04 0700 In: [id number] [P.O.:1440; IV Piggyback:50] Out: 1640 [Urine:1640] Intake/Output this shift:    Lab Results:  CBC: Recent Labs  03/27/15 0426 03/28/15 0235  WBC 12.5* 10.9*  HGB 10.3* 10.3*  HCT 32.4* 31.6*  PLT 125* 129*    BMET:  Recent Labs  03/27/15 0426 03/28/15 0235  NA 134* 134*  K 4.5 4.0  CL 100* 98*  CO2 30 30  GLUCOSE 129* 138*  BUN 18 17  CREATININE 1.37* 1.23  CALCIUM 8.3* 8.4*     PT/INR:   Recent Labs  03/25/15 1220  LABPROT 16.4*  INR 1.31    CBG (last 3)   Recent Labs  03/27/15 0412 03/27/15 0826 03/27/15 1140  GLUCAP 111* 138* 136*    ABG    Component Value Date/Time   PHART 7.385 03/25/2015 1753   PCO2ART 39.4 03/25/2015 1753   PO2ART 62.0* 03/25/2015 1753   HCO3 23.6 03/25/2015 1753   TCO2 27 03/26/2015 1714   ACIDBASEDEF 1.0 03/25/2015 1753   O2SAT 91.0 03/25/2015 1753    CXR: CHEST 2 VIEW  COMPARISON: 03/27/2015 and 03/26/2015.  FINDINGS: Interval right IJ sheath removal. The  heart size and mediastinal contours are stable status post median sternotomy and aortic valve replacement. Overall left lung aeration has improved. There is a small residual loculated pleural effusion on the left containing a small amount of pleural air anteriorly. There is no significant pneumothorax.  IMPRESSION: Interval improving left lung aeration with small residual loculated hydro pneumothorax on the left.   Electronically Signed  By: Richardean Sale M.D.  On: 03/28/2015 08:5  Assessment/Plan: S/P Procedure(s) (LRB): AORTIC VALVE REPLACEMENT (AVR) (N/A) TRANSESOPHAGEAL ECHOCARDIOGRAM (TEE) (N/A)  Overall stable POD3 Maintaining NSR w/ stable BP - brief episode rate-controlled Aflutter this morning O2 sats 90-93% on 2 L/min - intermittently drop into 80% when patient sleeping Expected post op atelectasis L>R, much improved Expected post op acute blood loss anemia, mild, stable Expected post op volume excess, mild, UOP adequate   Increase metoprolol and watch rhythm  Consider adding coumadin if any further episodes Afib/Aflutter  Continue to hold ACE-I for now but will restart if BP increases  Mobilize  Pulm toilet  Diuresis  Still awaiting bed for transfer   Rexene Alberts, MD 03/28/2015 10:40 AM

## 2015-03-29 ENCOUNTER — Encounter (HOSPITAL_COMMUNITY): Payer: Self-pay | Admitting: Surgery

## 2015-03-29 MED ORDER — LACTULOSE 10 GM/15ML PO SOLN
20.0000 g | Freq: Every day | ORAL | Status: DC | PRN
  Administered 2015-03-29: 20 g via ORAL
  Filled 2015-03-29: qty 30

## 2015-03-29 MED FILL — Heparin Sodium (Porcine) Inj 1000 Unit/ML: INTRAMUSCULAR | Qty: 2500 | Status: AC

## 2015-03-29 NOTE — Progress Notes (Signed)
CARDIAC REHAB PHASE I   PRE:  Rate/Rhythm: 80 SR  BP:  Sitting: 117/60        SaO2: 93 RA  MODE:  Ambulation: 550 ft   POST:  Rate/Rhythm: 87 SR  BP:  Sitting: 134/66         SaO2: 93 RA  Pt ambulated 550 ft on RA, rolling walker, assist x1, steady gait, tolerated well. Pt denies CP, dizziness, DOE, declined rest stop. Pt very pleasant, good use of sternal precautions.  Encouraged additional ambulation x2 today, IS, pt verbalized understanding. Pt to bed per pt request in anticipation of EPW removal, wife at bedside, call bell within reach. Will follow.   [id number] Lenna Sciara, RN, BSN 03/29/2015 9:56 AM

## 2015-03-29 NOTE — Progress Notes (Signed)
Removed epicardial wires per order. 4 intact.  Pt tolerated procedure well.  Pt instructed to remain on bedrest for one hour.  Frequent vitals will be taken and documented. Pt resting with call bell within reach. ° °

## 2015-03-29 NOTE — Progress Notes (Addendum)
      [address]ary [address]       [place] [address]             [phone]      4 Days Post-Op Procedure(s) (LRB): AORTIC VALVE REPLACEMENT (AVR) (N/A) TRANSESOPHAGEAL ECHOCARDIOGRAM (TEE) (N/A)   Subjective:  Zachary [name] has no complaints.  He is ambulating  No BM since surgery   Objective: Vital signs in last 24 hours: Temp:  [97.6 F (36.4 C)-98 F (36.7 C)] 97.7 F (36.5 C) (12/05 0454) Pulse Rate:  [86-91] 86 (12/05 0454) Cardiac Rhythm:  [-] Normal sinus rhythm (12/05 0738) Resp:  [14-18] 18 (12/05 0454) BP: (105-122)/(58-79) 112/58 mmHg (12/05 0454) SpO2:  [90 %-95 %] 91 % (12/05 0454) Weight:  [206 lb 8 oz (93.668 kg)] 206 lb 8 oz (93.668 kg) (12/05 0454)  Intake/Output from previous day: 12/04 0701 - 12/05 0700 In: 960 [P.O.:960] Out: 350 [Urine:350]  General appearance: alert, cooperative and no distress Heart: regular rate and rhythm Lungs: clear to auscultation bilaterally Abdomen: soft, non-tender; bowel sounds normal; no masses,  no organomegaly Extremities: edema minimal to none Wound: clean and dry  Lab Results:  Recent Labs  03/27/15 0426 03/28/15 0235  WBC 12.5* 10.9*  HGB 10.3* 10.3*  HCT 32.4* 31.6*  PLT 125* 129*   BMET:  Recent Labs  03/27/15 0426 03/28/15 0235  NA 134* 134*  K 4.5 4.0  CL 100* 98*  CO2 30 30  GLUCOSE 129* 138*  BUN 18 17  CREATININE 1.37* 1.23  CALCIUM 8.3* 8.4*    PT/INR: No results for input(s): LABPROT, INR in the last 72 hours. ABG    Component Value Date/Time   PHART 7.385 03/25/2015 1753   HCO3 23.6 03/25/2015 1753   TCO2 27 03/26/2015 1714   ACIDBASEDEF 1.0 03/25/2015 1753   O2SAT 91.0 03/25/2015 1753   CBG (last 3)   Recent Labs  03/27/15 0412 03/27/15 0826 03/27/15 1140  GLUCAP 111* 138* 136*    Assessment/Plan: S/P Procedure(s) (LRB): AORTIC VALVE REPLACEMENT (AVR) (N/A) TRANSESOPHAGEAL ECHOCARDIOGRAM (TEE) (N/A)  1. CV- NSR this AM- tolerating increased dose of  Lopressor, BP controlled 2. Pulm- no acute issues, off oxygen, continue IS 3. Renal- creatinine mildly elevated on 12/4, weight is stable on Lasix 40 mg BID, will decrease dose to once a day 4. LOC Constipation- will order Lactulose prn 5. Dispo- patient stable, maintaining NSR, will d/c EPW, possibly home in AM   LOS: 4 days    BARRETT, ERIN 03/29/2015   Chart reviewed, patient examined, agree with above. He is doing well but no BM yet. Abdomen is benign. Will give some lactulose tonight.

## 2015-03-29 NOTE — Progress Notes (Signed)
Utilization review completed.  

## 2015-03-30 MED ORDER — TRAMADOL HCL 50 MG PO TABS: 50.0000 mg | ORAL_TABLET | ORAL | Status: DC | PRN

## 2015-03-30 MED ORDER — ASPIRIN 325 MG PO TBEC: 325.0000 mg | DELAYED_RELEASE_TABLET | Freq: Every day | ORAL | Status: DC

## 2015-03-30 MED ORDER — OXYCODONE HCL 5 MG PO TABS: 5.0000 mg | ORAL_TABLET | ORAL | Status: DC | PRN

## 2015-03-30 MED ORDER — METOPROLOL TARTRATE 25 MG PO TABS: 25.0000 mg | ORAL_TABLET | Freq: Two times a day (BID) | ORAL | Status: DC

## 2015-03-30 NOTE — Progress Notes (Signed)
      [address]       [place] [address]             [phone]      5 Days Post-Op Procedure(s) (LRB): AORTIC VALVE REPLACEMENT (AVR) (N/A) TRANSESOPHAGEAL ECHOCARDIOGRAM (TEE) (N/A)   Subjective:  Mr. Arn has no complaints.  He is ambulating without assistance.  + Bm  Objective: Vital signs in last 24 hours: Temp:  [98.1 F (36.7 C)-98.7 F (37.1 C)] 98.1 F (36.7 C) (12/06 0540) Pulse Rate:  [84-95] 87 (12/06 0540) Cardiac Rhythm:  [-] Normal sinus rhythm (12/06 0735) Resp:  [18] 18 (12/06 0540) BP: (102-143)/(61-83) 127/66 mmHg (12/06 0540) SpO2:  [93 %-97 %] 97 % (12/06 0540) Weight:  [205 lb 14.6 oz (93.4 kg)] 205 lb 14.6 oz (93.4 kg) (12/06 0540)  Intake/Output from previous day: 12/05 0701 - 12/06 0700 In: 720 [P.O.:720] Out: -   General appearance: alert, cooperative and no distress Heart: regular rate and rhythm Lungs: clear to auscultation bilaterally Abdomen: soft, non-tender; bowel sounds normal; no masses,  no organomegaly Extremities: edema none appreciated Wound: clean and dry  Lab Results:  Recent Labs  03/28/15 0235  WBC 10.9*  HGB 10.3*  HCT 31.6*  PLT 129*   BMET:  Recent Labs  03/28/15 0235  NA 134*  K 4.0  CL 98*  CO2 30  GLUCOSE 138*  BUN 17  CREATININE 1.23  CALCIUM 8.4*    PT/INR: No results for input(s): LABPROT, INR in the last 72 hours. ABG    Component Value Date/Time   PHART 7.385 03/25/2015 1753   HCO3 23.6 03/25/2015 1753   TCO2 27 03/26/2015 1714   ACIDBASEDEF 1.0 03/25/2015 1753   O2SAT 91.0 03/25/2015 1753   CBG (last 3)   Recent Labs  03/27/15 0826 03/27/15 1140  GLUCAP 138* 136*    Assessment/Plan: S/P Procedure(s) (LRB): AORTIC VALVE REPLACEMENT (AVR) (N/A) TRANSESOPHAGEAL ECHOCARDIOGRAM (TEE) (N/A)  1. CV- NSR this AM- continue Lopressor 2. Pulm- no acute issues, continue IS 3. Renal- weight is below baseline, will d/c Lasix 4. DIspo- patient doing well, maintaining  NSR, ambulating without difficulty.... Will d/c patient home   LOS: 5 days    Ahmed Prima, Junie Panning 03/30/2015

## 2015-03-30 NOTE — Progress Notes (Signed)
Ed completed with pt and wife. Voiced understanding and requests his referral be sent to [facility]. Discussed Hgb A1C and following DM diet as well. Set up d/c video. [place], ACSM 9:10 AM 03/30/2015

## 2015-03-30 NOTE — Progress Notes (Signed)
Pt/family given discharge instructions, medication lists, follow up appointments, and when to call the doctor.  Pt/family verbalizes understanding. Pt given signs and symptoms of infection. Patient given prescriptions. Payton Emerald, RN

## 2015-03-30 NOTE — Discharge Instructions (Signed)
Aortic Valve Replacement, Care After °Refer to this sheet in the next few weeks. These instructions provide you with information on caring for yourself after your procedure. Your health care provider may also give you specific instructions. Your treatment has been planned according to current medical practices, but problems sometimes occur. Call your health care provider if you have any problems or questions after your procedure. °HOME CARE INSTRUCTIONS  °· Take medicines only as directed by your health care provider. °· If your health care provider has prescribed elastic stockings, wear them as directed. °· Take frequent naps or rest often throughout the day. °· Avoid lifting over 10 lbs (4.5 kg) or pushing or pulling things with your arms for 6-8 weeks or as directed by your health care provider. °· Avoid driving or airplane travel for 4-6 weeks after surgery or as directed by your health care provider. If you are riding in a car for an extended period, stop every 1-2 hours to stretch your legs. Keep a record of your medicines and medical history with you when traveling. °· Do not drive or operate heavy machinery while taking pain medicine. (narcotics). °· Do not cross your legs. °· Do not use any tobacco products including cigarettes, chewing tobacco, or electronic cigarettes. If you need help quitting, ask your health care provider. °· Do not take baths, swim, or use a hot tub until your health care provider approves. Take showers once your health care provider approves. Pat incisions dry. Do not rub incisions with a washcloth or towel. °· Avoid climbing stairs and using the handrail to pull yourself up for the first 2-3 weeks after surgery. °· Return to work as directed by your health care provider. °· Drink enough fluid to keep your urine clear or pale yellow. °· Do not strain to have a bowel movement. Eat high-fiber foods if you become constipated. You may also take a medicine to help you have a bowel  movement (laxative) as directed by your health care provider. °· Resume sexual activity as directed by your health care provider. Men should not use medicines for erectile dysfunction until their doctor says it is okay. °· If you had a certain type of heart condition in the past, you may need to take antibiotic medicine before having dental work or surgery. Let your dentist and health care providers know if you had one or more of the following: °¨ Previous endocarditis. °¨ An artificial (prosthetic) heart valve. °¨ Congenital heart disease. °SEEK MEDICAL CARE IF: °· You develop a skin rash.   °· You experience sudden changes in your weight. °· You have a fever. °SEEK IMMEDIATE MEDICAL CARE IF:  °· You develop chest pain that is not coming from your incision. °· You have drainage (pus), redness, swelling, or pain at your incision site.   °· You develop shortness of breath or have difficulty breathing.   °· You have increased bleeding from your incision site.   °· You develop light-headedness.   °MAKE SURE YOU:  °· Understand these directions. °· Will watch your condition. °· Will get help right away if you are not doing well or get worse. °  °This information is not intended to replace advice given to you by your health care provider. Make sure you discuss any questions you have with your health care provider. °  °Document Released: 10/27/2004 Document Revised: 05/01/2014 Document Reviewed: 01/23/2012 °Elsevier Interactive Patient Education ©2016 [employer] Inc[employer] ° °

## 2015-04-05 ENCOUNTER — Other Ambulatory Visit: Payer: Self-pay | Admitting: *Deleted

## 2015-04-05 ENCOUNTER — Encounter (INDEPENDENT_AMBULATORY_CARE_PROVIDER_SITE_OTHER): Payer: Self-pay

## 2015-04-05 DIAGNOSIS — I2510 Atherosclerotic heart disease of native coronary artery without angina pectoris: Secondary | ICD-10-CM

## 2015-04-05 DIAGNOSIS — G8918 Other acute postprocedural pain: Secondary | ICD-10-CM

## 2015-04-05 MED ORDER — TRAMADOL HCL 50 MG PO TABS: 50.0000 mg | ORAL_TABLET | Freq: Four times a day (QID) | ORAL | Status: DC | PRN

## 2015-04-05 MED ORDER — OXYCODONE HCL 5 MG PO TABS: 5.0000 mg | ORAL_TABLET | ORAL | Status: DC | PRN

## 2015-04-07 ENCOUNTER — Encounter (HOSPITAL_COMMUNITY): Payer: Self-pay | Admitting: Surgery

## 2015-04-13 NOTE — Progress Notes (Signed)
Cardiology Office Note   Date:  04/15/2015   ID:  Zachary [name], DOB 10-Jan-1946, MRN [id number]  PCP:  Lester Kinsman, PA-C  Cardiologist:  Dr. Tamala Julian    Follow up s/p AVR   History of Present Illness: Zachary [name] is a 69 y.o. 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   He had an 2D ECHO in 01/2015 which showed normal LVF but severe AS: mean gradient (S):49 mm Hg. Valve area (VTI): 1.08 cm^2. He was seen by Dr. Tamala Julian on 03/03/15 with symptoms concerning for symptomatic AS. He had two episodes of dizziness without syncope, SOB and exertional fatigue. He was set up for a R/LHC. He underwent R/LHC on 03/10/15 which confirmed severe AS with papillated area valve area of 0.65 cm. There was heavy aortic valve calcification and peak to peak aortic valve gradient of 45 mmHg with a mean gradient of 40 mmHg. There was non-obstructive stenoses in the proximal RCA and proximal to mid LAD that was insignificant by FFR measured at 0.86. Dr. Cyndia Bent was consulted for surgical AVR. He agreed with Dr. Barkley Kratochvil Caul assessment that CABG was not needed and that these lesions would be amenable to PCI in the future if they progressed. He recommended proceeding with aortic valve replacement using a tissue valve, given his age of 24 and history of some GI blood loss on aspirin.   The patient was admitted to [facility] on 03/25/2015 and taken to the operating room for surgical aortic valve replacement with a 27 mm Edwards Magna-Ease pericardial valve. His postoperative course was uneventful.He had a brief episode of rate controlled atrial flutter which spontaneously converted to sinus rhythm. His beta blocker dose was titrated accordingly. He also had some mild volume overload and was started on Lasix, to which he responded well. He was discharged in good condition on 04/09/15.  Today he  presents to clinic for follow up. He has some soreness at the incision site. Sometimes he gets a little a little pressure in his left chest that is very mild. Not associated with exertion. It self resolves and lasts seconds. No SOB, LE edema, orthopnea or PND.  No dizziness or syncope. His appetite is not back to normal, but he thinks that is a good thing. He has lost a little weight (6 lbs). Energy levels have improved. He has been walking at least 30 minutes a day and he has been having no problems.     Past Medical History  Diagnosis Date  . HTN (hypertension)   . Erectile dysfunction   . Hypercholesteremia   . GERD (gastroesophageal reflux disease)   . Back pain   . Aortic valvar stenosis   . Urinary frequency   . Arthritis     Past Surgical History  Procedure Laterality Date  . Tonsillectomy    . Lumbar disc surgery    . Cardiac catheterization N/A 03/10/2015    Procedure: Right/Left Heart Cath and Coronary Angiography;  Surgeon: Belva Crome, MD;  Location: [facility];  Service: Cardiovascular;  Laterality: N/A;  . Cardiac catheterization N/A 03/10/2015    Procedure: Intravascular Pressure Wire/FFR Study;  Surgeon: Belva Crome, MD;  Location: [facility];  Service: Cardiovascular;  Laterality: N/A;  . Aortic valve replacement N/A 03/25/2015    Procedure: AORTIC VALVE REPLACEMENT (AVR);  Surgeon: Gaye Pollack,  MD;  Location: [facility];  Service: Open Heart Surgery;  Laterality: N/A;  . Tee without cardioversion N/A 03/25/2015    Procedure: TRANSESOPHAGEAL ECHOCARDIOGRAM (TEE);  Surgeon: Gaye Pollack, MD;  Location: [facility];  Service: Open Heart Surgery;  Laterality: N/A;     Current Outpatient Prescriptions  Medication Sig Dispense Refill  . acetaminophen (TYLENOL) 500 MG tablet Take 500 mg by mouth every 6 (six) hours as needed for mild pain.    Marland Kitchen aspirin EC 325 MG EC tablet Take 1 tablet (325 mg total) by mouth daily. 30 tablet 0  . atorvastatin (LIPITOR) 40 MG  tablet Take 40 mg by mouth daily.    . metoprolol tartrate (LOPRESSOR) 25 MG tablet Take 1 tablet (25 mg total) by mouth 2 (two) times daily. 180 tablet 3  . oxyCODONE (OXY IR/ROXICODONE) 5 MG immediate release tablet Take 1-2 tablets (5-10 mg total) by mouth every 3 (three) hours as needed for severe pain. 30 tablet 0  . ranitidine (ZANTAC) 150 MG capsule Take 150 mg by mouth daily.     . traMADol (ULTRAM) 50 MG tablet Take 1-2 tablets (50-100 mg total) by mouth every 6 (six) hours as needed for moderate pain. 30 tablet 0   No current facility-administered medications for this visit.    Allergies:   Review of patient's allergies indicates no known allergies.    Social History:  The patient  reports that he has never smoked. He has never used smokeless tobacco. He reports that he does not drink alcohol or use illicit drugs.   Family History:  The patient's family history includes Cancer in his father; Diabetes in his mother; Healthy in his sister, sister, and sister; Heart attack in his mother; Heart disease in his mother; Hyperlipidemia in his brother; Hypertension in his mother; Stroke in his mother.    ROS:  Please see the history of present illness.   Otherwise, review of systems are positive for none.   All other systems are reviewed and negative.    PHYSICAL EXAM: VS:  BP 110/70 mmHg  Pulse 93  Ht 5' 11.5" (1.816 m)  Wt 199 lb (90.266 kg)  BMI 27.37 kg/m2 , BMI Body mass index is 27.37 kg/(m^2). GEN: Well nourished, well developed, in no acute distress HEENT: normal Neck: no JVD, carotid bruits, or masses Cardiac: RRR; no murmurs, rubs, or gallops,no edema  Respiratory:  clear to auscultation bilaterally, normal work of breathing GI: soft, nontender, nondistended, + BS MS: no deformity or atrophy Skin: warm and dry, no rash Neuro:  Strength and sensation are intact Psych: euthymic mood, full affect   EKG:  EKG is ordered today. The ekg ordered today demonstrates HR 93.  NSR, RAD   Recent Labs: 03/24/2015: ALT 24 03/26/2015: Magnesium 2.2 03/28/2015: BUN 17; Creatinine, Ser 1.23; Hemoglobin 10.3*; Platelets 129*; Potassium 4.0; Sodium 134*    Lipid Panel No results found for: CHOL, TRIG, HDL, CHOLHDL, VLDL, LDLCALC, LDLDIRECT    Wt Readings from Last 3 Encounters:  04/15/15 199 lb (90.266 kg)  03/30/15 205 lb 14.6 oz (93.4 kg)  03/24/15 207 lb 1.6 oz (93.94 kg)      Other studies Reviewed: Additional studies/ records that were reviewed today include: R/LHC, TEE Review of the above records demonstrates:   Right/Left Heart Cath and Coronary Angiography  03/10/15    Conclusion    1. Prox RCA lesion, 50% stenosed. 2. 1st Mrg lesion, 95% stenosed. 3. Prox Cx to Dist Cx lesion, 25% stenosed.  4. Prox LAD to Mid LAD lesion, 65% stenosed. 5. Dist LAD lesion, 50% stenosed.   Severe aortic stenosis with papillated area valve area of 0.65 cm  Heavy aortic valve calcification and peak to peak aortic valve gradient of 45 mmHg with a mean gradient of 40 mmHg  Normal right heart pressures  Normal left ventricular systolic function and hemodynamics  Coronary artery disease with 50% proximal RCA 95% branch of the first marginal. The vessel is too small to bypass and I did not feel percutaneous intervention was needed in absence of symptoms. Intermediate stenosis in the proximal to mid LAD with an FFR of 0.86 documenting that the lesion is not hemodynamically significant. There is also 50% distal LAD narrowing. The first septal perforator contains 90% ostial narrowing. RECOMMENDATIONS:   Heart valve team approach for consideration of aortic valve replacement. He is low risk for surgery and therefore I assume that he will have open surgical valve replacement.  I don't believe he needs coronary grafting as his coronary lesions are not critical.     TEE 03/25/2015 LV EF: 50% -  55% Study Conclusions - Left ventricle: Systolic function was normal.  The estimated ejection fraction was in the range of 50% to 55%. - Aortic valve: There was severe stenosis. Valve area (VTI): 1.12 cm^2. Valve area (Vmax): 1.14 cm^2. Valve area (Vmean): 0.99 cm^2. - Atrial septum: No defect or patent foramen ovale was identified. Echo contrast study showed no right-to-left atrial level shunt, following an increase in RA pressure induced by provocative maneuvers.   ASSESSMENT AND PLAN: Zachary [name] is a 69 y.o. 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   Severe calcific AS s/p surgical AVR: with a 27 mm Edwards Magna-Ease pericardial valve on 03/25/2015  Non-obst CAD: will decrease ASA from 325mg  --> 81mg . Continue BB and statin   Post operative atrial flutter: Today he presents in NSR . Very brief. Long term AC not felt to be warranted. Continue lopressor 25mg  BID.   HTN: BP 110/70. Well controlled on current regimen.   HLD: continue statin   Possible Mitral stenosis: noted on 01/2015 2D ECHO: There is a thickened,rheumatic-appearing mitral valve. The valve was not fullyinterrogated, but suspect mitral stenosis is no more than mild. Not mentioned on cath   Current medicines are reviewed at length with the patient today.  The patient does not have concerns regarding medicines.  The following changes have been made:  Decrease ASA as above.   Labs/ tests ordered today include:  No orders of the defined types were placed in this encounter.     Disposition:   FU with  Dr. Tamala Julian in 2-3 months  Signed, Crista Luria  04/15/2015 8:37 AM    [address], [place], [place]  [address] Phone: [phone]; Fax: [phone]

## 2015-04-15 ENCOUNTER — Ambulatory Visit (INDEPENDENT_AMBULATORY_CARE_PROVIDER_SITE_OTHER): Payer: Medicare Other | Admitting: Physician Assistant

## 2015-04-15 ENCOUNTER — Encounter: Payer: Self-pay | Admitting: Physician Assistant

## 2015-04-15 VITALS — BP 110/70 | HR 93 | Ht 71.5 in | Wt 199.0 lb

## 2015-04-15 DIAGNOSIS — I25119 Atherosclerotic heart disease of native coronary artery with unspecified angina pectoris: Secondary | ICD-10-CM

## 2015-04-15 DIAGNOSIS — I119 Hypertensive heart disease without heart failure: Secondary | ICD-10-CM | POA: Diagnosis not present

## 2015-04-15 MED ORDER — ASPIRIN 81 MG PO TBEC: 81.0000 mg | DELAYED_RELEASE_TABLET | Freq: Every day | ORAL | Status: DC

## 2015-04-15 MED ORDER — METOPROLOL TARTRATE 25 MG PO TABS: 25.0000 mg | ORAL_TABLET | Freq: Two times a day (BID) | ORAL | Status: DC

## 2015-04-15 NOTE — Patient Instructions (Addendum)
Medication Instructions:  Your physician has recommended you make the following change in your medication:  1.  STOP the Aspirin 325 mg 2.  START the Aspirin 81 mg taking 1 daily   Labwork: None ordered  Testing/Procedures: None ordered  Follow-Up: Your physician recommends that you schedule a follow-up appointment in: 2-3 [place]   Any Other Special Instructions Will Be Listed Below (If Applicable).     If you need a refill on your cardiac medications before your next appointment, please call your pharmacy.

## 2015-04-27 ENCOUNTER — Other Ambulatory Visit: Payer: Self-pay | Admitting: Surgery

## 2015-04-27 DIAGNOSIS — Z952 Presence of prosthetic heart valve: Secondary | ICD-10-CM

## 2015-04-28 ENCOUNTER — Ambulatory Visit
Admission: RE | Admit: 2015-04-28 | Discharge: 2015-04-28 | Disposition: A | Discharge status: Home / Self Care | Payer: Medicare Other | Source: Ambulatory Visit | Attending: Surgery | Admitting: Surgery

## 2015-04-28 ENCOUNTER — Encounter: Payer: Self-pay | Admitting: Surgery

## 2015-04-28 ENCOUNTER — Ambulatory Visit (INDEPENDENT_AMBULATORY_CARE_PROVIDER_SITE_OTHER): Payer: Self-pay | Admitting: Surgery

## 2015-04-28 VITALS — BP 119/72 | HR 90 | Resp 20 | Ht 71.5 in | Wt 205.0 lb

## 2015-04-28 DIAGNOSIS — I350 Nonrheumatic aortic (valve) stenosis: Secondary | ICD-10-CM

## 2015-04-28 DIAGNOSIS — Z954 Presence of other heart-valve replacement: Secondary | ICD-10-CM

## 2015-04-28 DIAGNOSIS — Z952 Presence of prosthetic heart valve: Secondary | ICD-10-CM

## 2015-04-28 NOTE — Progress Notes (Signed)
      HPI: Patient returns for routine postoperative follow-up having undergone AVR with a 27 mm pericardial valve on 03/25/2015. The patient's early postoperative recovery while in the hospital was notable for an uncomplicated postop course. Since hospital discharge the patient reports that he has been feeling well. He is walking without chest pain or shortness of breath.   Current Outpatient Prescriptions  Medication Sig Dispense Refill  . acetaminophen (TYLENOL) 500 MG tablet Take 500 mg by mouth every 6 (six) hours as needed for mild pain.    Marland Kitchen aspirin 81 MG EC tablet Take 1 tablet (81 mg total) by mouth daily.    Marland Kitchen atorvastatin (LIPITOR) 40 MG tablet Take 40 mg by mouth daily.    . metoprolol tartrate (LOPRESSOR) 25 MG tablet Take 1 tablet (25 mg total) by mouth 2 (two) times daily. 180 tablet 3  . oxyCODONE (OXY IR/ROXICODONE) 5 MG immediate release tablet Take 1-2 tablets (5-10 mg total) by mouth every 3 (three) hours as needed for severe pain. 30 tablet 0  . ranitidine (ZANTAC) 150 MG capsule Take 150 mg by mouth daily.     . traMADol (ULTRAM) 50 MG tablet Take 1-2 tablets (50-100 mg total) by mouth every 6 (six) hours as needed for moderate pain. 30 tablet 0   No current facility-administered medications for this visit.    Physical Exam: BP 119/72 mmHg  Pulse 90  Resp 20  Ht 5' 11.5" (1.816 m)  Wt 205 lb (92.987 kg)  BMI 28.20 kg/m2  SpO2 97% He looks well. Lung exam is clear. Cardiac exam shows a regular rate and rhythm with normal heart sounds. Chest incision is healing well and sternum is stable. There is no peripheral edema.   Diagnostic Tests:   CLINICAL DATA: Aortic valve replacement  EXAM: CHEST 2 VIEW  COMPARISON: 03/28/2015  FINDINGS: Previous median sternotomy and aortic valve replacement. The heart size is normal. No pleural effusion or edema. No airspace consolidation. Spondylosis noted within the thoracic spine.  IMPRESSION: 1. No  acute cardiopulmonary abnormalities.   Electronically Signed  By: Kerby Moors M.D.  On: 04/28/2015 09:54      Impression:  Overall I think he is doing well. I encouraged him to continue walking. He is planning to participate in cardiac rehab. I told him he could drive his car but should not lift anything heavier than 10 lbs for three months postop.    Plan:  He will continue followup with his PCP and Dr. Tamala Julian.    Gaye Pollack, MD [facility] [phone]

## 2015-05-05 ENCOUNTER — Telehealth: Payer: Self-pay | Admitting: Interventional Cardiology

## 2015-05-05 NOTE — Telephone Encounter (Signed)
Follow Up   Pt called states that he hasnt heard anything about cardiac therapy. Requests a call back to discuss.

## 2015-05-06 NOTE — Telephone Encounter (Signed)
Returned call to pt wife. Adv her that we have not received a rqst for a ref for cardiac rehab, she sts that they were told some one would contact them about rehab shortly after pt AVR and d/c from [facility]. Adv pt wife I would be happy to initiate the ref, it requires Dr.Smith's signature and he is on vacation. Adv Mrs.Bussard that once Dr.Smith returns from vacation in 10 days, I will have the form signed and faxed. She voiced appreciation and verbalized understanding.

## 2015-05-28 ENCOUNTER — Telehealth: Payer: Self-pay | Admitting: Interventional Cardiology

## 2015-05-28 MED ORDER — AMOXICILLIN 500 MG PO TABS: 2000.0000 mg | ORAL_TABLET | Freq: Once | ORAL | Status: DC

## 2015-05-28 NOTE — Telephone Encounter (Signed)
Patient is going to be seeing new dentist on 2/20.   Had  valve replacement 03/2015 Rx sent for Amoxicillin 2 grams to his pharmacy Patient understands to take the medication one hour prior to procedure

## 2015-05-28 NOTE — Telephone Encounter (Signed)
New message     Pt is seeing a new dentist at [facility] on 06-14-15.  Because he will be a new pt,they will not presc an antibiotic. Please call in an antibiotic for pt at [facility].  He had valve replacement 03-25-15.  If any problems, please call

## 2015-06-02 NOTE — Telephone Encounter (Signed)
Pt is tracking insurance. Will call back with information regarding coverage and copayment.

## 2015-07-08 ENCOUNTER — Telehealth (HOSPITAL_COMMUNITY): Payer: Self-pay

## 2015-07-08 NOTE — Telephone Encounter (Signed)
Contacted pt to start CRPII and pt declined!

## 2015-07-16 ENCOUNTER — Ambulatory Visit (INDEPENDENT_AMBULATORY_CARE_PROVIDER_SITE_OTHER): Payer: Medicare Other | Admitting: Interventional Cardiology

## 2015-07-16 ENCOUNTER — Encounter: Payer: Self-pay | Admitting: Interventional Cardiology

## 2015-07-16 VITALS — BP 108/68 | HR 58 | Ht 71.5 in | Wt 204.4 lb

## 2015-07-16 DIAGNOSIS — Z952 Presence of prosthetic heart valve: Secondary | ICD-10-CM

## 2015-07-16 DIAGNOSIS — I25119 Atherosclerotic heart disease of native coronary artery with unspecified angina pectoris: Secondary | ICD-10-CM | POA: Diagnosis not present

## 2015-07-16 DIAGNOSIS — Z954 Presence of other heart-valve replacement: Secondary | ICD-10-CM

## 2015-07-16 DIAGNOSIS — E785 Hyperlipidemia, unspecified: Secondary | ICD-10-CM | POA: Diagnosis not present

## 2015-07-16 DIAGNOSIS — I119 Hypertensive heart disease without heart failure: Secondary | ICD-10-CM | POA: Diagnosis not present

## 2015-07-16 NOTE — Progress Notes (Signed)
Cardiology Office Note   Date:  07/16/2015   ID:  Zachary [name], DOB 07-Jan-1946, MRN [id number]  PCP:  Lester Kinsman, PA-C  Cardiologist:  Sinclair Grooms, MD   No chief complaint on file.     History of Present Illness: Zachary [name] is a 70 y.o. male who presents for bicuspid aortic valve and multivessel moderate coronary disease. He underwent aortic valve replacement with a bioprosthesis. Coronary disease was not grafted because it was not felt to be critical enough.  Underwent surgery in December 2016. Received a bioprosthetic aortic valve by Dr. Cyndia Bent and also had coronary bypass grafting. Following surgery he has done well. No specific clinical events or complications following surgery. He is on rosuvastatin for hyperlipidemia.    Past Medical History  Diagnosis Date  . HTN (hypertension)   . Erectile dysfunction   . Hypercholesteremia   . GERD (gastroesophageal reflux disease)   . Back pain   . Aortic valvar stenosis   . Urinary frequency   . Arthritis     Past Surgical History  Procedure Laterality Date  . Tonsillectomy    . Lumbar disc surgery    . Cardiac catheterization N/A 03/10/2015    Procedure: Right/Left Heart Cath and Coronary Angiography;  Surgeon: Belva Crome, MD;  Location: [facility];  Service: Cardiovascular;  Laterality: N/A;  . Cardiac catheterization N/A 03/10/2015    Procedure: Intravascular Pressure Wire/FFR Study;  Surgeon: Belva Crome, MD;  Location: [facility];  Service: Cardiovascular;  Laterality: N/A;  . Aortic valve replacement N/A 03/25/2015    Procedure: AORTIC VALVE REPLACEMENT (AVR);  Surgeon: Gaye Pollack, MD;  Location: [facility];  Service: Open Heart Surgery;  Laterality: N/A;  . Tee without cardioversion N/A 03/25/2015    Procedure: TRANSESOPHAGEAL ECHOCARDIOGRAM (TEE);  Surgeon: Gaye Pollack, MD;  Location: [facility];  Service: Open Heart Surgery;  Laterality: N/A;     Current Outpatient Prescriptions    Medication Sig Dispense Refill  . amoxicillin (AMOXIL) 500 MG tablet Take 4 tablets (2,000 mg total) by mouth once. (Patient taking differently: Take 2,000 mg by mouth once. Before dental apppointments) 4 tablet 0  . aspirin 81 MG EC tablet Take 1 tablet (81 mg total) by mouth daily.    Marland Kitchen atorvastatin (LIPITOR) 40 MG tablet Take 40 mg by mouth daily.    . metoprolol tartrate (LOPRESSOR) 25 MG tablet Take 1 tablet (25 mg total) by mouth 2 (two) times daily. 180 tablet 3  . ranitidine (ZANTAC) 150 MG capsule Take 150 mg by mouth daily.      No current facility-administered medications for this visit.    Allergies:   Review of patient's allergies indicates no known allergies.    Social History:  The patient  reports that he has never smoked. He has never used smokeless tobacco. He reports that he does not drink alcohol or use illicit drugs.   Family History:  The patient's family history includes Cancer in his father; Diabetes in his mother; Healthy in his sister, sister, and sister; Heart attack in his mother; Heart disease in his mother; Hyperlipidemia in his brother; Hypertension in his mother; Stroke in his mother.    ROS:  Please see the history of present illness.   Otherwise, review of systems are positive for Insomnia.   All other systems are reviewed and negative.    PHYSICAL EXAM: VS:  BP 108/68 mmHg  Pulse 58  Ht 5'  11.5" (1.816 m)  Wt 204 lb 6.4 oz (92.715 kg)  BMI 28.11 kg/m2  SpO2 96% , BMI Body mass index is 28.11 kg/(m^2). GEN: Well nourished, well developed, in no acute distress HEENT: normal Neck: no JVD, carotid bruits, or masses Cardiac: RRR.  There is no murmur, rub, or gallop. There is no edema. Respiratory:  clear to auscultation bilaterally, normal work of breathing. GI: soft, nontender, nondistended, + BS MS: no deformity or atrophy Skin: warm and dry, no rash Neuro:  Strength and sensation are intact Psych: euthymic mood, full affect   EKG:  EKG is not  ordered today.    Recent Labs: 03/24/2015: ALT 24 03/26/2015: Magnesium 2.2 03/28/2015: BUN 17; Creatinine, Ser 1.23; Hemoglobin 10.3*; Platelets 129*; Potassium 4.0; Sodium 134*    Lipid Panel No results found for: CHOL, TRIG, HDL, CHOLHDL, VLDL, LDLCALC, LDLDIRECT    Wt Readings from Last 3 Encounters:  07/16/15 204 lb 6.4 oz (92.715 kg)  04/28/15 205 lb (92.987 kg)  04/15/15 199 lb (90.266 kg)      Other studies Reviewed: Additional studies/ records that were reviewed today include: Review of surgical notes.. The findings include postoperative course was uneventful..    ASSESSMENT AND PLAN:  1. Coronary artery disease involving native coronary artery of native heart with angina pectoris ([place]) Stable without angina  2. Hypertensive heart disease without heart failure Stable without dyspnea  3. S/P AVR Bioprosthetic aortic valve without evidence on clinical exam of dysfunction  4. Hyperlipidemia Followed by primary care    Current medicines are reviewed at length with the patient today.  The patient has the following concerns regarding medicines: None.  The following changes/actions have been instituted:    Increase physical activity without restrictions  Labs/ tests ordered today include:  No orders of the defined types were placed in this encounter.     Disposition:   FU with HS in 6 months  Signed, Sinclair Grooms, MD  07/16/2015 12:16 PM    [facility] [address], [place], [place]  [address] Phone: [phone]; Fax: [phone]

## 2015-07-16 NOTE — Patient Instructions (Signed)
Medication Instructions:   Your physician recommends that you continue on your current medications as directed. Please refer to the Current Medication list given to you today.   If you need a refill on your cardiac medications before your next appointment, please call your pharmacy.  Labwork: NONE ORDER TODAY    Testing/Procedures:  NONE ORDER TODAY    Follow-Up:  Your physician wants you to follow-up in:  IN  [place] will receive a reminder letter in the mail two months in advance. If you don't receive a letter, please call our office to schedule the follow-up appointment.      Any Other Special Instructions Will Be Listed Below (If Applicable).

## 2015-08-31 ENCOUNTER — Telehealth: Payer: Self-pay

## 2015-08-31 NOTE — Telephone Encounter (Signed)
Cardiac clearance placed in MR nurse fax box to be faxed to [facility] fax # [phone]

## 2016-03-08 ENCOUNTER — Other Ambulatory Visit: Payer: Self-pay | Admitting: Physician Assistant

## 2016-04-11 ENCOUNTER — Ambulatory Visit: Payer: Medicare Other | Admitting: Interventional Cardiology

## 2016-05-10 ENCOUNTER — Other Ambulatory Visit: Payer: Self-pay | Admitting: Physician Assistant

## 2016-07-04 NOTE — Progress Notes (Signed)
Cardiology Office Note    Date:  07/05/2016   ID:  Zachary [name], DOB July 14, 1945, MRN [id number]  PCP:  Zachary Leriche, PA-C  Cardiologist: Zachary Grooms, MD   Chief Complaint  Patient presents with  . Cardiac Valve Problem    History of Present Illness:  Zachary [name] is a 71 y.o. male who is status post aortic valve replacement for severe aortic stenosis and December 2016. Other problem includes hyperlipidemia and hypertension.  He is doing well. He has not had syncope, palpitations, chills, fever, sternal soreness, orthopnea, PND, or leg edema. His physical activity is increased. He is in [employer]. Endurance has improved. There is no complaint of dyspnea.  Past Medical History:  Diagnosis Date  . Aortic valvar stenosis   . Arthritis   . Back pain   . Erectile dysfunction   . GERD (gastroesophageal reflux disease)   . HTN (hypertension)   . Hypercholesteremia   . Urinary frequency     Past Surgical History:  Procedure Laterality Date  . AORTIC VALVE REPLACEMENT N/A 03/25/2015   Procedure: AORTIC VALVE REPLACEMENT (AVR);  Surgeon: Zachary Pollack, MD;  Location: [facility];  Service: Open Heart Surgery;  Laterality: N/A;  . CARDIAC CATHETERIZATION N/A 03/10/2015   Procedure: Right/Left Heart Cath and Coronary Angiography;  Surgeon: Zachary Crome, MD;  Location: [facility];  Service: Cardiovascular;  Laterality: N/A;  . CARDIAC CATHETERIZATION N/A 03/10/2015   Procedure: Intravascular Pressure Wire/FFR Study;  Surgeon: Zachary Crome, MD;  Location: [facility];  Service: Cardiovascular;  Laterality: N/A;  . LUMBAR [address]    . TEE WITHOUT CARDIOVERSION N/A 03/25/2015   Procedure: TRANSESOPHAGEAL ECHOCARDIOGRAM (TEE);  Surgeon: Zachary Pollack, MD;  Location: [facility];  Service: Open Heart Surgery;  Laterality: N/A;  . TONSILLECTOMY      Current Medications: Outpatient Medications Prior to Visit  Medication Sig Dispense Refill  . aspirin 81 MG EC  tablet Take 1 tablet (81 mg total) by mouth daily.    Marland Kitchen atorvastatin (LIPITOR) 40 MG tablet Take 40 mg by mouth daily.    . metoprolol tartrate (LOPRESSOR) 25 MG tablet TAKE 1 TABLET BY MOUTH TWO  TIMES DAILY 180 tablet 0  . ranitidine (ZANTAC) 150 MG capsule Take 150 mg by mouth daily.     Marland Kitchen amoxicillin (AMOXIL) 500 MG tablet Take 4 tablets (2,000 mg total) by mouth once. (Patient not taking: Reported on 07/05/2016) 4 tablet 0   No facility-administered medications prior to visit.      Allergies:   Patient has no known allergies.   Social History   Social History  . Marital status: Married    Spouse name: N/A  . Number of children: N/A  . Years of education: N/A   Social History Main Topics  . Smoking status: Never Smoker  . Smokeless tobacco: Never Used  . Alcohol use No  . Drug use: No  . Sexual activity: Not Asked   Other Topics Concern  . None   Social History Narrative  . None     Family History:  The patient's family history includes Cancer in his father; Diabetes in his mother; Healthy in his sister, sister, and sister; Heart attack in his mother; Heart disease in his mother; Hyperlipidemia in his brother; Hypertension in his mother; Stroke in his mother.   ROS:   Please see the history of present illness.    Left arm and shoulder discomfort.  He gets worse with physical activity that requires changing the arm position.  All other systems reviewed and are negative.   PHYSICAL EXAM:   VS:  BP 134/78 (BP Location: Left Arm)   Pulse 60   Ht 6' (1.829 m)   Wt 208 lb 9.6 oz (94.6 kg)   BMI 28.29 kg/m    GEN: Well nourished, well developed, in no acute distress  HEENT: normal  Neck: no JVD, carotid bruits, or masses Cardiac: RRR; Very faint right upper sternal systolic murmur, rubs, or gallops,no edema  Respiratory:  clear to auscultation bilaterally, normal work of breathing GI: soft, nontender, nondistended, + BS MS: no deformity or atrophy  Skin: warm and  dry, no rash Neuro:  Alert and Oriented x 3, Strength and sensation are intact Psych: euthymic mood, full affect  Wt Readings from Last 3 Encounters:  07/05/16 208 lb 9.6 oz (94.6 kg)  07/16/15 204 lb 6.4 oz (92.7 kg)  04/28/15 205 lb (93 kg)      Studies/Labs Reviewed:   EKG:  EKG  Sinus rhythm, small inferior Q waves in lead III and aVF. There is no change compared to prior tracings done before CABG. No change compared to last year.  Recent Labs: No results found for requested labs within last 8760 hours.   Lipid Panel No results found for: CHOL, TRIG, HDL, CHOLHDL, VLDL, LDLCALC, LDLDIRECT  Additional studies/ records that were reviewed today include:  ECHOCARDIOGRAM 2016: Study Conclusions  - Left ventricle: The cavity size was normal. Wall thickness was   increased in a pattern of moderate LVH. Systolic function was   normal. The estimated ejection fraction was in the range of 60%   to 65%. Wall motion was normal; there were no regional wall   motion abnormalities. Doppler parameters are consistent with   abnormal left ventricular relaxation (grade 1 diastolic   dysfunction). - Aortic valve: Trileaflet; severely calcified leaflets. There was   severe stenosis. There was mild regurgitation. Mean gradient (S):   49 mm Hg. Valve area (VTI): 1.08 cm^2. - Aorta: Mildly dilated aortic root and ascending aorta. Aortic   root dimension: 39 mm (ED). Ascending aortic diameter: 42 mm (S). 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 Mildly thickened,   mildly calcified leaflets . I suspect mild mitral stenosis. There   was no significant regurgitation. Peak gradient (D): 9 mm Hg.   Valve area by pressure half-time: 2.48 cm^2. - Left atrium: The atrium was mildly dilated. - Right ventricle: The cavity size was normal. Systolic function   was normal. - Pulmonary arteries: No complete TR doppler jet so unable to    estimate PA systolic pressure. - Inferior vena cava: The vessel was normal in size. The   respirophasic diameter changes were in the normal range (>= 50%),   consistent with normal central venous pressure.  Impressions:  - Normal LV size with moderate LV hypertrophy. EF 60-65%. Aortic   stenosis with mean gradient 49 mmHg and AVA 1.08 cm^2 (calculates   to larger than expected valve area due to large LVOT diameter). I   suspect that aortic stenosis is severe. There is a thickened,   rheumatic-appearing mitral valve. The valve was not fully   interrogated, but suspect mitral stenosis is no more than mild.   Normal RV size and systolic function.  No echo has been performed since aortic valve replacement in December 2016:  Presurgical cardiac catheterization November 2016: Coronary Diagrams   Diagnostic Diagram        ASSESSMENT:    1. S/P AVR   2. Aortic valve disorder   3. Coronary artery disease involving native coronary artery of native heart without angina pectoris   4. Hypertensive heart disease without heart failure      PLAN:  In order of problems listed above:  1. Normally functioning bioprosthetic aortic valve. No clinical symptoms at this time. 2. As noted above. Calcific aortic stenosis has been resolved. 3. Nonobstructive coronary disease at the time of surgery did not require bypass. Risk factor modification including LDL less than 70 and blood pressure control are essential to prevent progression. Lipids are being followed by primary care. Last total cholesterol was 114. 4. Hypertension is under control with minimal therapy. He does have diastolic dysfunction but no clinical evidence of diastolic heart failure.  Overall plan is to return in one year. In the next 12-36 months and echocardiogram will be done to check on the bioprosthetic aortic valve.    Medication Adjustments/Labs and Tests Ordered: Current medicines are reviewed at length with the  patient today.  Concerns regarding medicines are outlined above.  Medication changes, Labs and Tests ordered today are listed in the Patient Instructions below. There are no Patient Instructions on file for this visit.   Signed, Zachary Grooms, MD  07/05/2016 9:24 AM    [facility] [address], [place], [place]  [address] Phone: [phone]; Fax: [phone]

## 2016-07-05 ENCOUNTER — Encounter: Payer: Self-pay | Admitting: Interventional Cardiology

## 2016-07-05 ENCOUNTER — Ambulatory Visit (INDEPENDENT_AMBULATORY_CARE_PROVIDER_SITE_OTHER): Payer: Medicare Other | Admitting: Interventional Cardiology

## 2016-07-05 VITALS — BP 134/78 | HR 60 | Ht 72.0 in | Wt 208.6 lb

## 2016-07-05 DIAGNOSIS — Z952 Presence of prosthetic heart valve: Secondary | ICD-10-CM

## 2016-07-05 DIAGNOSIS — I119 Hypertensive heart disease without heart failure: Secondary | ICD-10-CM

## 2016-07-05 DIAGNOSIS — I359 Nonrheumatic aortic valve disorder, unspecified: Secondary | ICD-10-CM

## 2016-07-05 DIAGNOSIS — I2510 Atherosclerotic heart disease of native coronary artery without angina pectoris: Secondary | ICD-10-CM | POA: Diagnosis not present

## 2016-07-05 NOTE — Patient Instructions (Signed)

## 2016-08-17 ENCOUNTER — Other Ambulatory Visit: Payer: Self-pay | Admitting: Interventional Cardiology

## 2017-01-15 IMAGING — CR DG CHEST 1V PORT
1 series · 1 of 1 positions shown · non-contrast
Comparison: 03/24/2015

CLINICAL DATA: Aortic valve replacement

EXAM:
PORTABLE CHEST 1 VIEW

[AP]
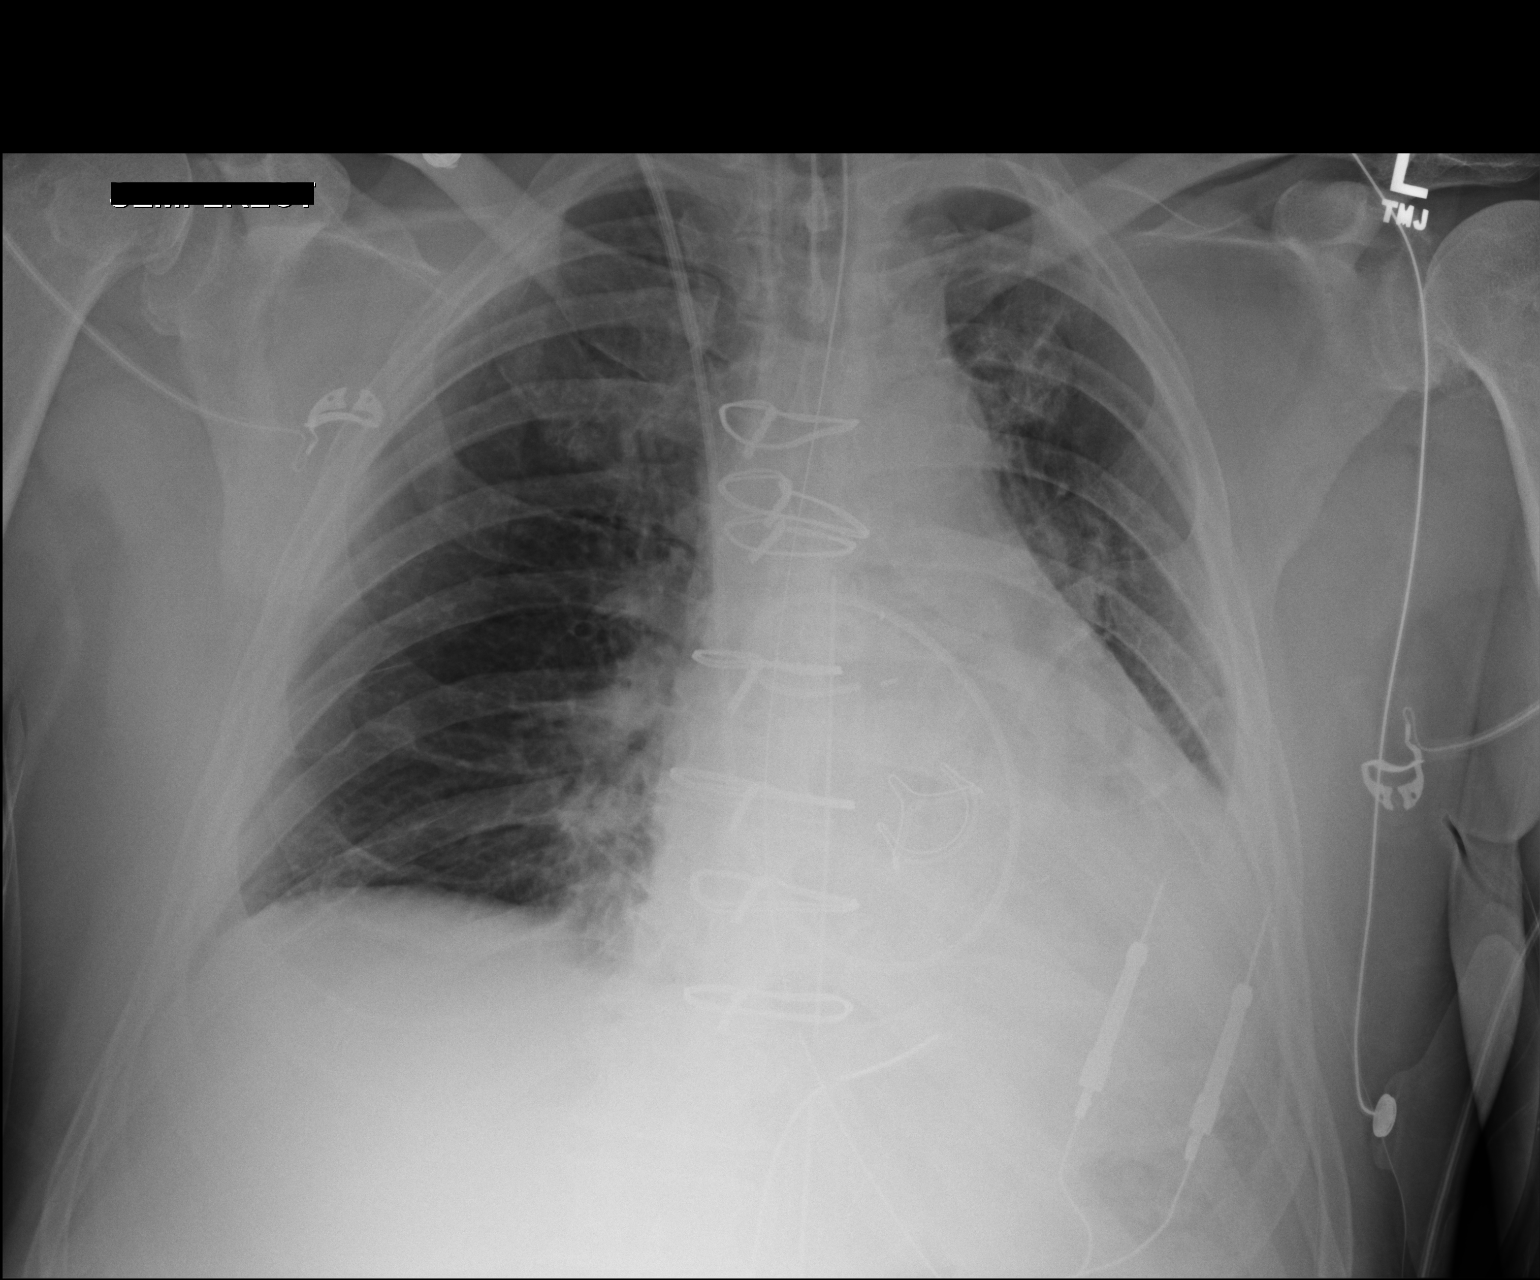

[1 of 1 positions shown; findings below may reference images not displayed]

FINDINGS: Endotracheal tube in good position. NG tube in the stomach.
Mediastinal drain noted. No chest tube. Aortic valve in satisfactory
position.

Right jugular Swan-Ganz catheter enters the right pulmonary artery.
The catheter is coiled in the right pulmonary artery with the tip
directed towards the main pulmonary artery.

Left lower lobe atelectasis and effusion.
IMPRESSION: Left lower lobe atelectasis and effusion.  No pneumothorax

Swan-Ganz catheter coiled in the right pulmonary artery.

## 2017-01-17 IMAGING — CR DG CHEST 1V PORT
1 series · 1 of 1 positions shown · non-contrast
Comparison: Radiographs 03/26/2015 and 03/25/2015.

CLINICAL DATA: Postop aortic valve replacement 03/25/2015.

EXAM:
PORTABLE CHEST 1 VIEW

[AP]
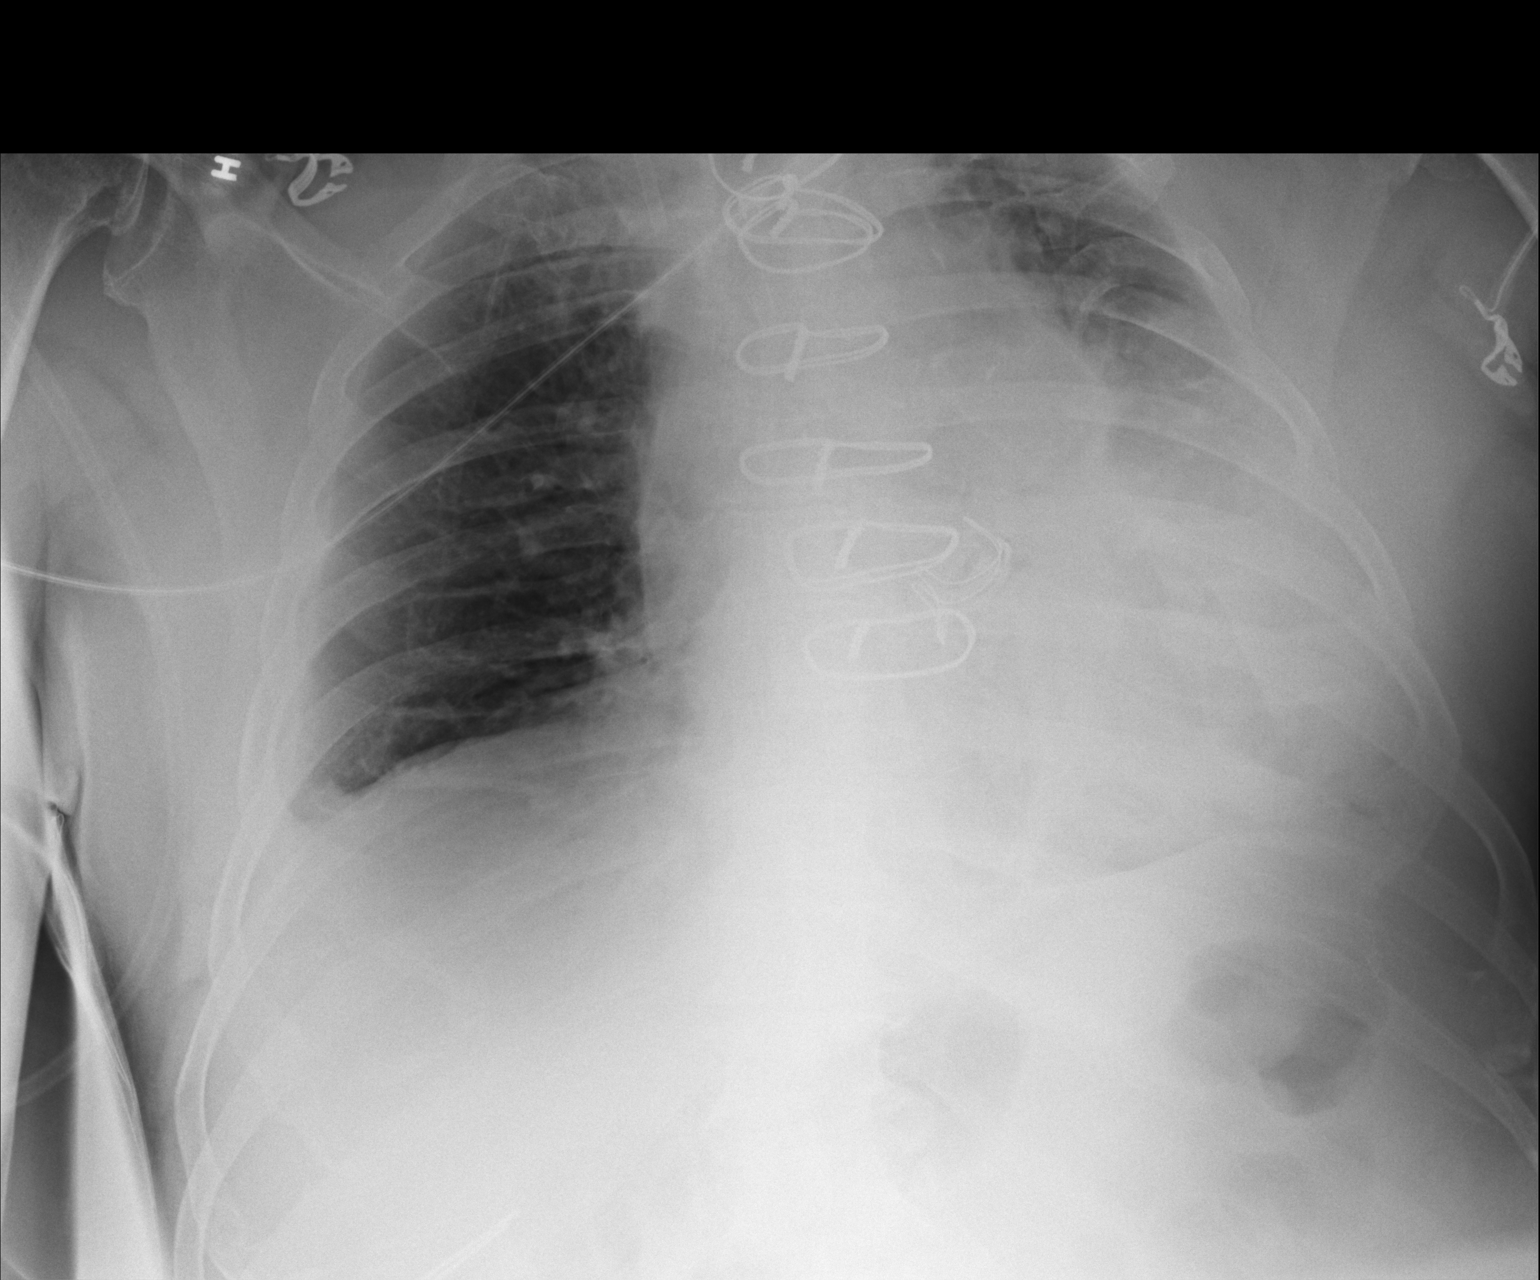

[1 of 1 positions shown; findings below may reference images not displayed]

FINDINGS: 5999 hours. Interval removal of the Swan-Ganz catheter and
mediastinal drain. Right IJ sheath remains in place. Positioning is
more lordotic on the current examination. Allowing for this, the
heart size and mediastinal contours are stable status post median
sternotomy and aortic valve replacement. Left-greater-than-right
basilar atelectasis and probable small bilateral pleural effusions
have also not significantly changed. There is no pneumothorax.
IMPRESSION: No significant change in bibasilar atelectasis and small pleural
effusions allowing for positional differences. No pneumothorax.

## 2017-05-10 ENCOUNTER — Other Ambulatory Visit: Payer: Self-pay | Admitting: Interventional Cardiology

## 2017-06-24 NOTE — Progress Notes (Signed)
Cardiology Office Note    Date:  06/25/2017   ID:  Zachary [name], DOB [birth year], MRN [id number]  PCP:  Maude Leriche, PA-C  Cardiologist: Sinclair Grooms, MD   Chief Complaint  Patient presents with  . Medication Problem    History of Present Illness:  Zachary [name] is a 72 y.o. male  who is status post aortic valve replacement for severe aortic stenosis in December 2016. Other problem includes hyperlipidemia and hypertension.   Zachary [name] is doing well.  He denies angina, syncope, palpitations, and has not had dyspnea.  No lower extremity swelling.  No neurological complaints.    Past Medical History:  Diagnosis Date  . Aortic valvar stenosis   . Arthritis   . Back pain   . Erectile dysfunction   . GERD (gastroesophageal reflux disease)   . HTN (hypertension)   . Hypercholesteremia   . Urinary frequency     Past Surgical History:  Procedure Laterality Date  . AORTIC VALVE REPLACEMENT N/A 03/25/2015   Procedure: AORTIC VALVE REPLACEMENT (AVR);  Surgeon: Gaye Pollack, MD;  Location: [facility];  Service: Open Heart Surgery;  Laterality: N/A;  . CARDIAC CATHETERIZATION N/A 03/10/2015   Procedure: Right/Left Heart Cath and Coronary Angiography;  Surgeon: Belva Crome, MD;  Location: [facility];  Service: Cardiovascular;  Laterality: N/A;  . CARDIAC CATHETERIZATION N/A 03/10/2015   Procedure: Intravascular Pressure Wire/FFR Study;  Surgeon: Belva Crome, MD;  Location: [facility];  Service: Cardiovascular;  Laterality: N/A;  . LUMBAR [place]    . TEE WITHOUT CARDIOVERSION N/A 03/25/2015   Procedure: TRANSESOPHAGEAL ECHOCARDIOGRAM (TEE);  Surgeon: Gaye Pollack, MD;  Location: [facility];  Service: Open Heart Surgery;  Laterality: N/A;  . TONSILLECTOMY      Current Medications: Outpatient Medications Prior to Visit  Medication Sig Dispense Refill  . acetaminophen (TYLENOL) 325 MG tablet Take 650 mg by mouth every 6 (six) hours as needed for mild pain.     Marland Kitchen amoxicillin (AMOXIL) 500 MG tablet Take 2,000 mg by mouth as needed (Take one (1) hour prior to dental procedures.).    Marland Kitchen aspirin 81 MG EC tablet Take 1 tablet (81 mg total) by mouth daily.    Marland Kitchen atorvastatin (LIPITOR) 40 MG tablet Take 40 mg by mouth daily.    . metoprolol tartrate (LOPRESSOR) 25 MG tablet Take 1 tablet (25 mg total) by mouth 2 (two) times daily. 180 tablet 0  . ranitidine (ZANTAC) 150 MG capsule Take 150 mg by mouth daily.      No facility-administered medications prior to visit.      Allergies:   Patient has no known allergies.   Social History   Socioeconomic History  . Marital status: Married    Spouse name: None  . Number of children: None  . Years of education: None  . Highest education level: None  Social Needs  . Financial resource strain: None  . Food insecurity - worry: None  . Food insecurity - inability: None  . Transportation needs - medical: None  . Transportation needs - non-medical: None  Occupational History  . None  Tobacco Use  . Smoking status: Never Smoker  . Smokeless tobacco: Never Used  Substance and Sexual Activity  . Alcohol use: No    Alcohol/week: 0.0 oz  . Drug use: No  . Sexual activity: None  Other Topics Concern  . None  Social History Narrative  . None  Family History:  The patient's family history includes Cancer in his father; Diabetes in his mother; Healthy in his sister, sister, and sister; Heart attack in his mother; Heart disease in his mother; Hyperlipidemia in his brother; Hypertension in his mother; Stroke in his mother.   ROS:   Please see the history of present illness.    Left hand and right hand numbness when he awakens goes away rapidly with movement.  Left shoulder discomfort with activity. All other systems reviewed and are negative.   PHYSICAL EXAM:   VS:  BP 114/68   Pulse 60   Ht 6' (1.829 m)   Wt 211 lb 1.9 oz (95.8 kg)   BMI 28.63 kg/m    GEN: Well nourished, well developed, in no  acute distress  HEENT: normal  Neck: no JVD, carotid bruits, or masses Cardiac: Mechanical valve closure sounds.  1/6 to 2/6 systolic murmur.  RRR; no rubs, or gallops,no edema  Respiratory:  clear to auscultation bilaterally, normal work of breathing GI: soft, nontender, nondistended, + BS MS: no deformity or atrophy  Skin: warm and dry, no rash Neuro:  Alert and Oriented x 3, Strength and sensation are intact Psych: euthymic mood, full affect  Wt Readings from Last 3 Encounters:  06/25/17 211 lb 1.9 oz (95.8 kg)  07/05/16 208 lb 9.6 oz (94.6 kg)  07/16/15 204 lb 6.4 oz (92.7 kg)      Studies/Labs Reviewed:   EKG:  EKG normal sinus rhythm.  No change when compared to prior tracings.  Small inferior Q waves.  Recent Labs: No results found for requested labs within last 8760 hours.   Lipid Panel No results found for: CHOL, TRIG, HDL, CHOLHDL, VLDL, LDLCALC, LDLDIRECT  Additional studies/ records that were reviewed today include:  Has not had a cardiac echo since valve replacement.  Cardiac catheterization report 2016: Severe aortic stenosis with papillated area valve area of 0.65 cm Heavy aortic valve calcification and peak to peak aortic valve gradient of 45 mmHg with a mean gradient of 40 mmHg Normal right heart pressures Normal left ventricular systolic function and hemodynamics Coronary artery disease with 50% proximal RCA 95% branch of the first marginal. The vessel is too small to bypass and I did not feel percutaneous intervention was needed in absence of symptoms. Intermediate stenosis in the proximal to mid LAD with an FFR of 0.86 documenting that the lesion is not hemodynamically significant. There is also 50% distal LAD narrowing. The first septal perforator contains 90% ostial narrowing.   ASSESSMENT:    1. Coronary artery disease of native artery of native heart with stable angina pectoris ([place])   2. Hypertensive heart disease with congestive heart failure,  unspecified heart failure type ([place])   3. Mixed hyperlipidemia   4. S/P AVR      PLAN:  In order of problems listed above:  1. Asymptomatic coronary disease which was not severe enough to intervene upon at the time of surgery.  Continue to follow for any development of angina.  Aggressive risk factor modification with LDL less than 70. 2. Blood pressures under excellent control on current medical regimen. 3. LDL target less than 71. 4.  2D Doppler echocardiogram to establish baseline appearance.   Clinical follow-up in 1 year.  Call if cardiac symptoms.    Medication Adjustments/Labs and Tests Ordered: Current medicines are reviewed at length with the patient today.  Concerns regarding medicines are outlined above.  Medication changes, Labs and Tests ordered today are  listed in the Patient Instructions below. Patient Instructions  Medication Instructions:  Your physician recommends that you continue on your current medications as directed. Please refer to the Current Medication list given to you today.  Labwork: None  Testing/Procedures: Your physician has requested that you have an echocardiogram anytime prior to next year's follow up. Echocardiography is a painless test that uses sound waves to create images of your heart. It provides your doctor with information about the size and shape of your heart and how well your heart's chambers and valves are working. This procedure takes approximately one hour. There are no restrictions for this procedure.    Follow-Up: Your physician wants you to follow-up in: 1 year with Dr. Tamala Julian.  You will receive a reminder letter in the mail two months in advance. If you don't receive a letter, please call our office to schedule the follow-up appointment.   Any Other Special Instructions Will Be Listed Below (If Applicable).     If you need a refill on your cardiac medications before your next appointment, please call your pharmacy.       Signed, Sinclair Grooms, MD  06/25/2017 10:34 AM    [facility] [address], [place], [place]  [address] Phone: [phone]; Fax: [phone]

## 2017-06-25 ENCOUNTER — Encounter: Payer: Self-pay | Admitting: Interventional Cardiology

## 2017-06-25 ENCOUNTER — Ambulatory Visit: Payer: Medicare Other | Admitting: Interventional Cardiology

## 2017-06-25 VITALS — BP 114/68 | HR 60 | Ht 72.0 in | Wt 211.1 lb

## 2017-06-25 DIAGNOSIS — Z952 Presence of prosthetic heart valve: Secondary | ICD-10-CM | POA: Diagnosis not present

## 2017-06-25 DIAGNOSIS — I110 Hypertensive heart disease with heart failure: Secondary | ICD-10-CM

## 2017-06-25 DIAGNOSIS — E782 Mixed hyperlipidemia: Secondary | ICD-10-CM

## 2017-06-25 DIAGNOSIS — I25118 Atherosclerotic heart disease of native coronary artery with other forms of angina pectoris: Secondary | ICD-10-CM

## 2017-06-25 NOTE — Patient Instructions (Signed)
Medication Instructions:  Your physician recommends that you continue on your current medications as directed. Please refer to the Current Medication list given to you today.  Labwork: None  Testing/Procedures: Your physician has requested that you have an echocardiogram anytime prior to next year's follow up. Echocardiography is a painless test that uses sound waves to create images of your heart. It provides your doctor with information about the size and shape of your heart and how well your heart's chambers and valves are working. This procedure takes approximately one hour. There are no restrictions for this procedure.    Follow-Up: Your physician wants you to follow-up in: 1 year with Dr. Tamala Julian.  You will receive a reminder letter in the mail two months in advance. If you don't receive a letter, please call our office to schedule the follow-up appointment.   Any Other Special Instructions Will Be Listed Below (If Applicable).     If you need a refill on your cardiac medications before your next appointment, please call your pharmacy.

## 2017-07-11 ENCOUNTER — Ambulatory Visit: Payer: Medicare Other | Admitting: Interventional Cardiology

## 2017-09-02 ENCOUNTER — Other Ambulatory Visit: Payer: Self-pay | Admitting: Interventional Cardiology

## 2018-01-07 ENCOUNTER — Ambulatory Visit (HOSPITAL_COMMUNITY): Payer: Medicare Other | Attending: Cardiology

## 2018-01-07 ENCOUNTER — Other Ambulatory Visit: Payer: Self-pay

## 2018-01-07 DIAGNOSIS — Z953 Presence of xenogenic heart valve: Secondary | ICD-10-CM | POA: Diagnosis not present

## 2018-01-07 DIAGNOSIS — Z952 Presence of prosthetic heart valve: Secondary | ICD-10-CM | POA: Diagnosis present

## 2018-01-07 DIAGNOSIS — I119 Hypertensive heart disease without heart failure: Secondary | ICD-10-CM | POA: Insufficient documentation

## 2018-01-07 DIAGNOSIS — E785 Hyperlipidemia, unspecified: Secondary | ICD-10-CM | POA: Diagnosis not present

## 2018-06-11 ENCOUNTER — Encounter: Payer: Self-pay | Admitting: Interventional Cardiology

## 2018-06-26 NOTE — Progress Notes (Signed)
Cardiology Office Note:    Date:  06/27/2018   ID:  Zachary [name], DOB 26-Nov-1945, MRN [id number]  PCP:  Maude Leriche, PA-C  Cardiologist:  Sinclair Grooms, MD   Referring MD: Maude Leriche, PA-C   Chief Complaint  Patient presents with  . Cardiac Valve Problem    History of Present Illness:    Zachary [name] is a 73 y.o. male with a hx of hx of CAD s/p stent placement in 2006, HFpEF with LVEF 55-65% on August 2017 LHC, HTN, OSA on CPAP, and DM2.  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  Zachary [name] is doing well.  He has no limitations in physical activity.  He works at a [employer] and does a lot of walking without limitations.  He has not had orthopnea, PND, chest pain, syncope, edema, or other heart failure type complaint.  He denies palpitations and syncope.  He did have an episode of chest discomfort 3 months ago that he states was left parasternal lasted 3 to 5 minutes, and felt like severe heartburn.  Intensity was 5/10 and it resolved spontaneously.  The discomfort came at rest.  No limitations in physical activity.  He is compliant with the current medical regimen.  Past Medical History:  Diagnosis Date  . Aortic valvar stenosis   . Arthritis   . Back pain   . Erectile dysfunction   . GERD (gastroesophageal reflux disease)   . HTN (hypertension)   . Hypercholesteremia   . Urinary frequency     Past Surgical History:  Procedure Laterality Date  . AORTIC VALVE REPLACEMENT N/A 03/25/2015   Procedure: AORTIC VALVE REPLACEMENT (AVR);  Surgeon: Gaye Pollack, MD;  Location: [place];  Service: Open Heart Surgery;  Laterality: N/A;  . CARDIAC CATHETERIZATION N/A 03/10/2015   Procedure: Right/Left Heart Cath and Coronary Angiography;  Surgeon: Belva Crome, MD;  Location: [place] [facility];  Service: Cardiovascular;  Laterality: N/A;  . CARDIAC CATHETERIZATION N/A 03/10/2015   Procedure: Intravascular Pressure Wire/FFR  Study;  Surgeon: Belva Crome, MD;  Location: [place] [facility];  Service: Cardiovascular;  Laterality: N/A;  . LUMBAR [place]    . TEE WITHOUT CARDIOVERSION N/A 03/25/2015   Procedure: TRANSESOPHAGEAL ECHOCARDIOGRAM (TEE);  Surgeon: Gaye Pollack, MD;  Location: [place];  Service: Open Heart Surgery;  Laterality: N/A;  . TONSILLECTOMY      Current Medications: Current Meds  Medication Sig  . acetaminophen (TYLENOL) 325 MG tablet Take 650 mg by mouth every 6 (six) hours as needed for mild pain.  Marland Kitchen amoxicillin (AMOXIL) 500 MG tablet Take 2,000 mg by mouth as needed (Take one (1) hour prior to dental procedures.).  Marland Kitchen aspirin 81 MG EC tablet Take 1 tablet (81 mg total) by mouth daily.  Marland Kitchen atorvastatin (LIPITOR) 40 MG tablet Take 40 mg by mouth daily.  . metoprolol tartrate (LOPRESSOR) 25 MG tablet Take 1 tablet (25 mg total) by mouth 2 (two) times daily.     Allergies:   Patient has no known allergies.   Social History   Socioeconomic History  . Marital status: Married    Spouse name: Not on file  . Number of children: Not on file  . Years of education: Not on file  . Highest education level: Not on file  Occupational History  . Not on file  Social Needs  . Financial resource strain: Not on file  . Food insecurity:  Worry: Not on file    Inability: Not on file  . Transportation needs:    Medical: Not on file    Non-medical: Not on file  Tobacco Use  . Smoking status: Never Smoker  . Smokeless tobacco: Never Used  Substance and Sexual Activity  . Alcohol use: No    Alcohol/week: 0.0 standard drinks  . Drug use: No  . Sexual activity: Not on file  Lifestyle  . Physical activity:    Days per week: Not on file    Minutes per session: Not on file  . Stress: Not on file  Relationships  . Social connections:    Talks on phone: Not on file    Gets together: Not on file    Attends religious service: Not on file    Active member of club or organization: Not on file     Attends meetings of clubs or organizations: Not on file    Relationship status: Not on file  Other Topics Concern  . Not on file  Social History Narrative  . Not on file     Family History: The patient's family history includes Cancer in his father; Diabetes in his mother; Healthy in his sister, sister, and sister; Heart attack in his mother; Heart disease in his mother; Hyperlipidemia in his brother; Hypertension in his mother; Stroke in his mother.  ROS:   Please see the history of present illness.    None All other systems reviewed and are negative.  EKGs/Labs/Other Studies Reviewed:    The following studies were reviewed today: 2D Doppler echocardiogram September 2019: Study Conclusions  - Left ventricle: The cavity size was normal. There was severe   concentric hypertrophy. Systolic function was normal. The   estimated ejection fraction was in the range of 50% to 55%. Wall   motion was normal; there were no regional wall motion   abnormalities. Features are consistent with a pseudonormal left   ventricular filling pattern, with concomitant abnormal relaxation   and increased filling pressure (grade 2 diastolic dysfunction). - Aortic valve: A 94mm Edwards Magna-Ease bioprosthesis was present   and functioning normally. The prosthesis had a normal range of   motion. The sewing ring appeared normal, had no rocking motion,   and showed no evidence of dehiscence. Mean gradient (S): 6 mm Hg.   Peak gradient (S): 13 mm Hg. Valve area (VTI): 1.79 cm^2. - Mitral valve: There was no regurgitation. - Left atrium: The atrium was severely dilated. - Right ventricle: Systolic function was normal. - Atrial septum: No defect or patent foramen ovale was identified. - Tricuspid valve: There was trivial regurgitation. - Pulmonic valve: There was no significant regurgitation.  Impressions:  - Normal LV EF with severe LVH and grade 2 diastolic dysfunction.   Severely dilated left  atrium. Bioprosthetic aortic valve appears   well seating, without significant regurgitation. Gradients are   appropriate for valve.  Cardiac catheterization November 2016: Conclusion   1. Prox RCA lesion, 50% stenosed. 2. 1st Mrg lesion, 95% stenosed. 3. Prox Cx to Dist Cx lesion, 25% stenosed. 4. Prox LAD to Mid LAD lesion, 65% stenosed. 5. Dist LAD lesion, 50% stenosed.    Severe aortic stenosis with papillated area valve area of 0.65 cm  Heavy aortic valve calcification and peak to peak aortic valve gradient of 45 mmHg with a mean gradient of 40 mmHg  Normal right heart pressures  Normal left ventricular systolic function and hemodynamics  Coronary artery disease with 50% proximal  RCA 95% branch of the first marginal. The vessel is too small to bypass and I did not feel percutaneous intervention was needed in absence of symptoms. Intermediate stenosis in the proximal to mid LAD with an FFR of 0.86 documenting that the lesion is not hemodynamically significant. There is also 50% distal LAD narrowing. The first septal perforator contains 90% ostial narrowing.       EKG:  EKG normal sinus rhythm, inferior Q waves, unchanged from prior, no change when compared to June 25, 2017.  Recent Labs: No results found for requested labs within last 8760 hours.  Recent Lipid Panel No results found for: CHOL, TRIG, HDL, CHOLHDL, VLDL, LDLCALC, LDLDIRECT  Physical Exam:    VS:  BP 132/82   Pulse 78   Ht 6' (1.829 m)   Wt 213 lb 6.4 oz (96.8 kg)   SpO2 94%   BMI 28.94 kg/m     Wt Readings from Last 3 Encounters:  06/27/18 213 lb 6.4 oz (96.8 kg)  06/25/17 211 lb 1.9 oz (95.8 kg)  07/05/16 208 lb 9.6 oz (94.6 kg)     GEN: Obese.. No acute distress HEENT: Normal NECK: No JVD. LYMPHATICS: No lymphadenopathy CARDIAC: RRR.  No murmur, S4 gallop, no edema VASCULAR: 2+ bilateral radial pulses, no bruits RESPIRATORY:  Clear to auscultation without rales, wheezing or rhonchi    ABDOMEN: Soft, non-tender, non-distended, No pulsatile mass, MUSCULOSKELETAL: No deformity  SKIN: Warm and dry NEUROLOGIC:  Alert and oriented x 3 PSYCHIATRIC:  Normal affect   ASSESSMENT:    1. S/P AVR   2. Coronary artery disease of native artery of native heart with stable angina pectoris ([place])   3. Hypertensive heart disease without heart failure   4. Mixed hyperlipidemia    PLAN:    In order of problems listed above:  1. Magna Ease pericardial valve with normal function based on clinical exam and echocardiography performed earlier this year.  Endocarditis prophylaxis discussed. 2. An episode of chest discomfort occurred 3 months ago and has not recurred.  It lasted 3 minutes.  He has been physically active without any recurrence.  Plan secondary risk prevention and close clinical follow-up. 3. Blood pressure is under good control. 4. Last LDL September 2019 was 66.  He is on high intensity statin therapy.  He is not as physically active as I would like.  We reviewed secondary prevention in great detail as outlined below.  He is encouraged to achieve at least 150 minutes of moderate exertional activity per week.  Overall education and awareness concerning primary/secondary risk prevention was discussed in detail: LDL less than 70, hemoglobin A1c less than 7, blood pressure target less than 130/80 mmHg, >150 minutes of moderate aerobic activity per week, avoidance of smoking, weight control (via diet and exercise), and continued surveillance/management of/for obstructive sleep apnea.  Clinical follow-up in 1 year.  Greater than 50% of the time during this office visit was spent in education, counseling, and coordination of care related to underlying disease process and testing as outlined.   Medication Adjustments/Labs and Tests Ordered: Current medicines are reviewed at length with the patient today.  Concerns regarding medicines are outlined above.  Orders Placed This  Encounter  Procedures  . EKG 12-Lead   No orders of the defined types were placed in this encounter.   Patient Instructions  Medication Instructions:  Your physician recommends that you continue on your current medications as directed. Please refer to the Current Medication list given to  you today.  If you need a refill on your cardiac medications before your next appointment, please call your pharmacy.   Lab work: None If you have labs (blood work) drawn today and your tests are completely normal, you will receive your results only by: Marland Kitchen MyChart Message (if you have MyChart) OR . A paper copy in the mail If you have any lab test that is abnormal or we need to change your treatment, we will call you to review the results.  Testing/Procedures: None  Follow-Up: At [facility], you and your health needs are our priority.  As part of our continuing mission to provide you with exceptional heart care, we have created designated Provider Care Teams.  These Care Teams include your primary Cardiologist (physician) and Advanced Practice Providers (APPs -  Physician Assistants and Nurse Practitioners) who all work together to provide you with the care you need, when you need it. You will need a follow up appointment in 12 months.  Please call our office 2 months in advance to schedule this appointment.  You may see Sinclair Grooms, MD or one of the following Advanced Practice Providers on your designated Care Team:   Truitt Merle, NP Cecilie Kicks, NP . Kathyrn Drown, NP  Any Other Special Instructions Will Be Listed Below (If Applicable).  Your provider recommends that you maintain 150 minutes per week of moderate aerobic activity.       Signed, Sinclair Grooms, MD  06/27/2018 9:44 AM    [place]

## 2018-06-27 ENCOUNTER — Ambulatory Visit: Payer: Medicare Other | Admitting: Interventional Cardiology

## 2018-06-27 ENCOUNTER — Encounter: Payer: Self-pay | Admitting: Interventional Cardiology

## 2018-06-27 VITALS — BP 132/82 | HR 78 | Ht 72.0 in | Wt 213.4 lb

## 2018-06-27 DIAGNOSIS — Z952 Presence of prosthetic heart valve: Secondary | ICD-10-CM | POA: Diagnosis not present

## 2018-06-27 DIAGNOSIS — E782 Mixed hyperlipidemia: Secondary | ICD-10-CM | POA: Diagnosis not present

## 2018-06-27 DIAGNOSIS — I25118 Atherosclerotic heart disease of native coronary artery with other forms of angina pectoris: Secondary | ICD-10-CM | POA: Diagnosis not present

## 2018-06-27 DIAGNOSIS — I119 Hypertensive heart disease without heart failure: Secondary | ICD-10-CM

## 2018-06-27 NOTE — Patient Instructions (Signed)
Medication Instructions:  Your physician recommends that you continue on your current medications as directed. Please refer to the Current Medication list given to you today.  If you need a refill on your cardiac medications before your next appointment, please call your pharmacy.   Lab work: None If you have labs (blood work) drawn today and your tests are completely normal, you will receive your results only by: . MyChart Message (if you have MyChart) OR . A paper copy in the mail If you have any lab test that is abnormal or we need to change your treatment, we will call you to review the results.  Testing/Procedures: None  Follow-Up: At [facility], you and your health needs are our priority.  As part of our continuing mission to provide you with exceptional heart care, we have created designated Provider Care Teams.  These Care Teams include your primary Cardiologist (physician) and Advanced Practice Providers (APPs -  Physician Assistants and Nurse Practitioners) who all work together to provide you with the care you need, when you need it. You will need a follow up appointment in 12 months.  Please call our office 2 months in advance to schedule this appointment.  You may see Henry W Smith III, MD or one of the following Advanced Practice Providers on your designated Care Team:   Lori Gerhardt, NP Laura Ingold, NP . Jill McDaniel, NP  Any Other Special Instructions Will Be Listed Below (If Applicable).  Your provider recommends that you maintain 150 minutes per week of moderate aerobic activity.    

## 2018-09-20 ENCOUNTER — Other Ambulatory Visit: Payer: Self-pay | Admitting: Interventional Cardiology

## 2019-06-07 ENCOUNTER — Ambulatory Visit: Payer: Medicare Other | Attending: Internal Medicine

## 2019-06-07 DIAGNOSIS — Z23 Encounter for immunization: Secondary | ICD-10-CM

## 2019-06-07 NOTE — Progress Notes (Signed)
   Covid-19 Vaccination Clinic  Name:  Zachary [name]    MRN: [id number] DOB: Feb 10, 1946  06/07/2019  [name] was observed post Covid-19 immunization for 15 minutes without incidence. He was provided with Vaccine Information Sheet and instruction to access the V-Safe system.   Mr. Cam was instructed to call 911 with any severe reactions post vaccine: Marland Kitchen Difficulty breathing  . Swelling of your face and throat  . A fast heartbeat  . A bad rash all over your body  . Dizziness and weakness    Immunizations Administered    Name Date Dose VIS Date Route   Pfizer COVID-19 Vaccine 06/07/2019  9:05 AM 0.3 mL 04/04/2019 Intramuscular   Manufacturer: [place]   Lot: [id number]   [place]: [id number]

## 2019-06-29 ENCOUNTER — Ambulatory Visit: Payer: Medicare Other | Attending: Internal Medicine

## 2019-06-29 DIAGNOSIS — Z23 Encounter for immunization: Secondary | ICD-10-CM | POA: Insufficient documentation

## 2019-06-29 NOTE — Progress Notes (Signed)
   Covid-19 Vaccination Clinic  Name:  Zachary [name]    MRN: [id number] DOB: Mar 20, 1946  06/29/2019  [name] was observed post Covid-19 immunization for 15 minutes without incident. He was provided with Vaccine Information Sheet and instruction to access the V-Safe system.   [name] was instructed to call 911 with any severe reactions post vaccine: Marland Kitchen Difficulty breathing  . Swelling of face and throat  . A fast heartbeat  . A bad rash all over body  . Dizziness and weakness   Immunizations Administered    Name Date Dose VIS Date Route   Pfizer COVID-19 Vaccine 06/29/2019  1:26 PM 0.3 mL 04/04/2019 Intramuscular   Manufacturer: [place]   Lot: [id number]   [place]: [id number]

## 2019-07-30 NOTE — Progress Notes (Signed)
Cardiology Office Note:    Date:  07/31/2019   ID:  Zachary [name], DOB 05/16/1945, MRN [id number]  PCP:  Zachary Leriche, PA-C  Cardiologist:  Zachary Grooms, MD   Referring MD: Zachary Leriche, PA-C   Chief Complaint  Patient presents with  . Coronary Artery Disease  . Cardiac Valve Problem    AVR    History of Present Illness:    Zachary [name] is a 74 y.o. male with a hx of CAD s/p stent placement in 2006, HFpEF with LVEF 55-65% on August 2017 LHC, HTN, OSA on CPAP, and DM2.  Aortic valve replacement December 2016 using [facility] Ease pericardial valve.  He is totally asymptomatic.  He has not had syncope, orthopnea, PND, palpitations, transient neurological symptoms, blood in the urine or stool, angina, or any other cardiac symptom.  Past Medical History:  Diagnosis Date  . Aortic valvar stenosis   . Arthritis   . Back pain   . Erectile dysfunction   . GERD (gastroesophageal reflux disease)   . HTN (hypertension)   . Hypercholesteremia   . Urinary frequency     Past Surgical History:  Procedure Laterality Date  . AORTIC VALVE REPLACEMENT N/A 03/25/2015   Procedure: AORTIC VALVE REPLACEMENT (AVR);  Surgeon: Zachary Pollack, MD;  Location: [place];  Service: Open Heart Surgery;  Laterality: N/A;  . CARDIAC CATHETERIZATION N/A 03/10/2015   Procedure: Right/Left Heart Cath and Coronary Angiography;  Surgeon: Zachary Crome, MD;  Location: [place] [facility];  Service: Cardiovascular;  Laterality: N/A;  . CARDIAC CATHETERIZATION N/A 03/10/2015   Procedure: Intravascular Pressure Wire/FFR Study;  Surgeon: Zachary Crome, MD;  Location: [place] [facility];  Service: Cardiovascular;  Laterality: N/A;  . LUMBAR [place]    . TEE WITHOUT CARDIOVERSION N/A 03/25/2015   Procedure: TRANSESOPHAGEAL ECHOCARDIOGRAM (TEE);  Surgeon: Zachary Pollack, MD;  Location: [place];  Service: Open Heart Surgery;  Laterality: N/A;  . TONSILLECTOMY      Current Medications: Current Meds    Medication Sig  . acetaminophen (TYLENOL) 325 MG tablet Take 650 mg by mouth every 6 (six) hours as needed for mild pain.  Marland Kitchen amoxicillin (AMOXIL) 500 MG tablet Take 2,000 mg by mouth as needed (Take one (1) hour prior to dental procedures.).  Marland Kitchen aspirin 81 MG EC tablet Take 1 tablet (81 mg total) by mouth daily.  Marland Kitchen atorvastatin (LIPITOR) 40 MG tablet Take 40 mg by mouth daily.  . metoprolol tartrate (LOPRESSOR) 25 MG tablet TAKE 1 TABLET BY MOUTH TWO  TIMES DAILY     Allergies:   Patient has no known allergies.   Social History   Socioeconomic History  . Marital status: Married    Spouse name: Not on file  . Number of children: Not on file  . Years of education: Not on file  . Highest education level: Not on file  Occupational History  . Not on file  Tobacco Use  . Smoking status: Never Smoker  . Smokeless tobacco: Never Used  Substance and Sexual Activity  . Alcohol use: No    Alcohol/week: 0.0 standard drinks  . Drug use: No  . Sexual activity: Not on file  Other Topics Concern  . Not on file  Social History Narrative  . Not on file   Social Determinants of Health   Financial Resource Strain:   . Difficulty of Paying Living Expenses:   Food Insecurity:   . Worried About Running  Out of Food in the Last Year:   . [place] in the Last Year:   Transportation Needs:   . Lack of Transportation (Medical):   Marland Kitchen Lack of Transportation (Non-Medical):   Physical Activity:   . Days of Exercise per Week:   . Minutes of Exercise per Session:   Stress:   . Feeling of Stress :   Social Connections:   . Frequency of Communication with Friends and Family:   . Frequency of Social Gatherings with Friends and Family:   . Attends Religious Services:   . Active Member of Clubs or Organizations:   . Attends Archivist Meetings:   Marland Kitchen Marital Status:      Family History: The patient's family history includes Cancer in his father; Diabetes in his mother; Healthy in  his sister, sister, and sister; Heart attack in his mother; Heart disease in his mother; Hyperlipidemia in his brother; Hypertension in his mother; Stroke in his mother.  ROS:   Please see the history of present illness.    He really has no complaints.  He is compliant with his medications.  He has been vaccinated.  All other systems reviewed and are negative.  EKGs/Labs/Other Studies Reviewed:    The following studies were reviewed today: The most recent echocardiogram was performed in 2019, September: Study Conclusions   - Left ventricle: The cavity size was normal. There was severe  concentric hypertrophy. Systolic function was normal. The  estimated ejection fraction was in the range of 50% to 55%. Wall  motion was normal; there were no regional wall motion  abnormalities. Features are consistent with a pseudonormal left  ventricular filling pattern, with concomitant abnormal relaxation  and increased filling pressure (grade 2 diastolic dysfunction).  - Aortic valve: A 50mm Edwards Magna-Ease bioprosthesis was present  and functioning normally. The prosthesis had a normal range of  motion. The sewing ring appeared normal, had no rocking motion,  and showed no evidence of dehiscence. Mean gradient (S): 6 mm Hg.  Peak gradient (S): 13 mm Hg. Valve area (VTI): 1.79 cm^2.  - Mitral valve: There was no regurgitation.  - Left atrium: The atrium was severely dilated.  - Right ventricle: Systolic function was normal.  - Atrial septum: No defect or patent foramen ovale was identified.  - Tricuspid valve: There was trivial regurgitation.  - Pulmonic valve: There was no significant regurgitation.   EKG:  EKG sinus bradycardia, small inferior Q waves, borderline first-degree AV block, biatrial abnormality, possible prior inferior infarct.  No changes noted when compared to March 2020.  Recent Labs: No results found for requested labs within last 8760 hours.  Recent  Lipid Panel No results found for: CHOL, TRIG, HDL, CHOLHDL, VLDL, LDLCALC, LDLDIRECT  Physical Exam:    VS:  BP 114/68   Pulse (!) 57   Ht 6' (1.829 m)   Wt 214 lb 9.6 oz (97.3 kg)   SpO2 94%   BMI 29.10 kg/m     Wt Readings from Last 3 Encounters:  07/31/19 214 lb 9.6 oz (97.3 kg)  06/27/18 213 lb 6.4 oz (96.8 kg)  06/25/17 211 lb 1.9 oz (95.8 kg)     GEN: Moderate obesity. No acute distress HEENT: Normal NECK: No JVD. LYMPHATICS: No lymphadenopathy CARDIAC:  RRR without murmur, gallop, or edema. VASCULAR:  Normal Pulses. No bruits. RESPIRATORY:  Clear to auscultation without rales, wheezing or rhonchi  ABDOMEN: Soft, non-tender, non-distended, No pulsatile mass, MUSCULOSKELETAL: No deformity  SKIN: Warm and dry NEUROLOGIC:  Alert and oriented x 3 PSYCHIATRIC:  Normal affect   ASSESSMENT:    1. S/P AVR   2. Coronary artery disease of native artery of native heart with stable angina pectoris ([place])   3. Hypertensive heart disease without heart failure   4. Mixed hyperlipidemia   5. Educated about COVID-19 virus infection    PLAN:    In order of problems listed above:  1. Status post bioprosthetic aortic valve replacement.  Currently doing well.  No evidence of degeneration based on exam.  No further evaluation at this time.  Consider echocardiogram within the next 2 to 4 years. 2. Secondary prevention discussed in detail including all targets.  Encouraged to increase activity to include at least 150 minutes of moderate activity per week. 3. Excellent blood pressure control and almost lowish.  Plan to decrease metoprolol to succinate 25 mg once per day.  This will allow a slight increase in heart rate and blood pressure. 4. LDL target less than 70 is being achieved with moderate intensity statin therapy. 5. He has received both doses of the vaccination.  He is practicing social distancing.  Overall education and awareness concerning primary/secondary risk prevention  was discussed in detail: LDL less than 70, hemoglobin A1c less than 7, blood pressure target less than 130/80 mmHg, >150 minutes of moderate aerobic activity per week, avoidance of smoking, weight control (via diet and exercise), and continued surveillance/management of/for obstructive sleep apnea.    Medication Adjustments/Labs and Tests Ordered: Current medicines are reviewed at length with the patient today.  Concerns regarding medicines are outlined above.  Orders Placed This Encounter  Procedures  . EKG 12-Lead   No orders of the defined types were placed in this encounter.   There are no Patient Instructions on file for this visit.   Signed, Zachary Grooms, MD  07/31/2019 9:40 AM    [name]

## 2019-07-31 ENCOUNTER — Encounter: Payer: Self-pay | Admitting: Interventional Cardiology

## 2019-07-31 ENCOUNTER — Ambulatory Visit: Payer: Medicare Other | Admitting: Interventional Cardiology

## 2019-07-31 ENCOUNTER — Other Ambulatory Visit: Payer: Self-pay

## 2019-07-31 VITALS — BP 114/68 | HR 57 | Ht 72.0 in | Wt 214.6 lb

## 2019-07-31 DIAGNOSIS — Z952 Presence of prosthetic heart valve: Secondary | ICD-10-CM | POA: Diagnosis not present

## 2019-07-31 DIAGNOSIS — Z7189 Other specified counseling: Secondary | ICD-10-CM

## 2019-07-31 DIAGNOSIS — I25118 Atherosclerotic heart disease of native coronary artery with other forms of angina pectoris: Secondary | ICD-10-CM

## 2019-07-31 DIAGNOSIS — I119 Hypertensive heart disease without heart failure: Secondary | ICD-10-CM | POA: Diagnosis not present

## 2019-07-31 DIAGNOSIS — E782 Mixed hyperlipidemia: Secondary | ICD-10-CM | POA: Diagnosis not present

## 2019-07-31 MED ORDER — METOPROLOL SUCCINATE ER 25 MG PO TB24: 25.0000 mg | ORAL_TABLET | Freq: Every day | ORAL | 3 refills | Status: DC

## 2019-07-31 NOTE — Patient Instructions (Addendum)
Medication Instructions:  1) When you finish your current supply of Metoprolol Tartrate, START Metoprolol Succinate 25mg  once daily.  *If you need a refill on your cardiac medications before your next appointment, please call your pharmacy*   Lab Work: None If you have labs (blood work) drawn today and your tests are completely normal, you will receive your results only by: Marland Kitchen MyChart Message (if you have MyChart) OR . A paper copy in the mail If you have any lab test that is abnormal or we need to change your treatment, we will call you to review the results.   Testing/Procedures: None   Follow-Up: At [facility], you and your health needs are our priority.  As part of our continuing mission to provide you with exceptional heart care, we have created designated Provider Care Teams.  These Care Teams include your primary Cardiologist (physician) and Advanced Practice Providers (APPs -  Physician Assistants and Nurse Practitioners) who all work together to provide you with the care you need, when you need it.  We recommend signing up for the patient portal called "MyChart".  Sign up information is provided on this After Visit Summary.  MyChart is used to connect with patients for Virtual Visits (Telemedicine).  Patients are able to view lab/test results, encounter notes, upcoming appointments, etc.  Non-urgent messages can be sent to your provider as well.   To learn more about what you can do with MyChart, go to [email or web address].    Your next appointment:   12 month(s)  The format for your next appointment:   In Person  Provider:   You may see Sinclair Grooms, MD or one of the following Advanced Practice Providers on your designated Care Team:    Truitt Merle, NP  Cecilie Kicks, NP  Kathyrn Drown, NP    Other Instructions

## 2020-05-29 ENCOUNTER — Other Ambulatory Visit: Payer: Self-pay | Admitting: Interventional Cardiology

## 2020-06-15 DIAGNOSIS — D1801 Hemangioma of skin and subcutaneous tissue: Secondary | ICD-10-CM | POA: Diagnosis not present

## 2020-06-15 DIAGNOSIS — L821 Other seborrheic keratosis: Secondary | ICD-10-CM | POA: Diagnosis not present

## 2020-06-15 DIAGNOSIS — L570 Actinic keratosis: Secondary | ICD-10-CM | POA: Diagnosis not present

## 2020-07-08 DIAGNOSIS — N183 Chronic kidney disease, stage 3 unspecified: Secondary | ICD-10-CM | POA: Diagnosis not present

## 2020-07-08 DIAGNOSIS — R7303 Prediabetes: Secondary | ICD-10-CM | POA: Diagnosis not present

## 2020-07-08 DIAGNOSIS — E7800 Pure hypercholesterolemia, unspecified: Secondary | ICD-10-CM | POA: Diagnosis not present

## 2020-07-08 DIAGNOSIS — Z23 Encounter for immunization: Secondary | ICD-10-CM | POA: Diagnosis not present

## 2020-07-08 DIAGNOSIS — I10 Essential (primary) hypertension: Secondary | ICD-10-CM | POA: Diagnosis not present

## 2020-07-08 DIAGNOSIS — Z0000 Encounter for general adult medical examination without abnormal findings: Secondary | ICD-10-CM | POA: Diagnosis not present

## 2020-08-13 ENCOUNTER — Other Ambulatory Visit: Payer: Self-pay | Admitting: Interventional Cardiology

## 2020-08-24 ENCOUNTER — Telehealth: Payer: Self-pay | Admitting: Interventional Cardiology

## 2020-08-24 DIAGNOSIS — I639 Cerebral infarction, unspecified: Secondary | ICD-10-CM

## 2020-08-24 DIAGNOSIS — Z952 Presence of prosthetic heart valve: Secondary | ICD-10-CM

## 2020-08-24 DIAGNOSIS — H349 Unspecified retinal vascular occlusion: Secondary | ICD-10-CM | POA: Diagnosis not present

## 2020-08-24 DIAGNOSIS — I25118 Atherosclerotic heart disease of native coronary artery with other forms of angina pectoris: Secondary | ICD-10-CM

## 2020-08-24 DIAGNOSIS — H3500 Unspecified background retinopathy: Secondary | ICD-10-CM | POA: Diagnosis not present

## 2020-08-24 DIAGNOSIS — Z961 Presence of intraocular lens: Secondary | ICD-10-CM | POA: Diagnosis not present

## 2020-08-24 NOTE — Telephone Encounter (Signed)
Dr. Luberta Mutter (optomologist) would like to speak directly to Dr. Tamala Julian.  Patient recently seen in office and a branch artery occlusion of right eye was noted. Office is okay with a call back in the next day or so. Will route to Dr. Tamala Julian and his nurse.

## 2020-08-24 NOTE — Telephone Encounter (Signed)
[facility] is calling to speak with a nurse in regards to this pt.please advise

## 2020-08-24 NOTE — Telephone Encounter (Signed)
New message:     Dr. Shanon Rosser would like Colletta Maryland or Dr. Tamala Julian to call her at the office and ask the operator to pull her out the room to speak to either one concering this patient. [phone] opt 0.

## 2020-08-24 NOTE — Telephone Encounter (Signed)
Left message at office to call back. (told staff at lunch)

## 2020-08-25 ENCOUNTER — Encounter: Payer: Self-pay | Admitting: *Deleted

## 2020-08-25 NOTE — Telephone Encounter (Signed)
Phone number for Dr. Ellie Lunch is [phone] opt 0

## 2020-08-25 NOTE — Telephone Encounter (Signed)
Please schedule 30 day monitor to r/o AF. Needs bilateral carotid doppler and 2D Doppler echo. 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

## 2020-08-25 NOTE — Telephone Encounter (Signed)
See other phone note

## 2020-08-25 NOTE — Progress Notes (Signed)
Patient ID: Zachary [name], male   DOB: [birth year], 75 y.o.   MRN: [id number] Patient enrolled for [employer] to ship a 30 day cardiac event monitor to his home.

## 2020-08-27 NOTE — Telephone Encounter (Signed)
Late Entry from 5/4.  Spoke with pt and made him aware of recommendations per Dr. Tamala Julian.  Pt is scheduled for testing and is aware monitor will be mailed to him.

## 2020-08-27 NOTE — Telephone Encounter (Signed)
Spoke to Dr. Ellie Lunch who is concerned about possible cardio-embolic events

## 2020-08-31 ENCOUNTER — Ambulatory Visit (INDEPENDENT_AMBULATORY_CARE_PROVIDER_SITE_OTHER): Payer: Medicare Other

## 2020-08-31 DIAGNOSIS — I4891 Unspecified atrial fibrillation: Secondary | ICD-10-CM

## 2020-08-31 DIAGNOSIS — I639 Cerebral infarction, unspecified: Secondary | ICD-10-CM

## 2020-09-02 ENCOUNTER — Ambulatory Visit (HOSPITAL_COMMUNITY)
Admission: RE | Admit: 2020-09-02 | Discharge: 2020-09-02 | Disposition: A | Discharge status: Home / Self Care | Payer: Medicare Other | Source: Ambulatory Visit | Attending: Cardiology | Admitting: Cardiology

## 2020-09-02 ENCOUNTER — Other Ambulatory Visit: Payer: Self-pay

## 2020-09-02 DIAGNOSIS — Z952 Presence of prosthetic heart valve: Secondary | ICD-10-CM | POA: Diagnosis not present

## 2020-09-02 DIAGNOSIS — H349 Unspecified retinal vascular occlusion: Secondary | ICD-10-CM | POA: Insufficient documentation

## 2020-09-02 DIAGNOSIS — I639 Cerebral infarction, unspecified: Secondary | ICD-10-CM | POA: Insufficient documentation

## 2020-09-02 DIAGNOSIS — I25118 Atherosclerotic heart disease of native coronary artery with other forms of angina pectoris: Secondary | ICD-10-CM | POA: Diagnosis not present

## 2020-09-21 ENCOUNTER — Other Ambulatory Visit: Payer: Self-pay

## 2020-09-21 ENCOUNTER — Ambulatory Visit (HOSPITAL_COMMUNITY): Payer: Medicare Other | Attending: Internal Medicine

## 2020-09-21 DIAGNOSIS — I25118 Atherosclerotic heart disease of native coronary artery with other forms of angina pectoris: Secondary | ICD-10-CM

## 2020-09-21 DIAGNOSIS — H349 Unspecified retinal vascular occlusion: Secondary | ICD-10-CM

## 2020-09-21 DIAGNOSIS — I639 Cerebral infarction, unspecified: Secondary | ICD-10-CM

## 2020-09-21 DIAGNOSIS — Z952 Presence of prosthetic heart valve: Secondary | ICD-10-CM

## 2020-09-21 LAB — ECHOCARDIOGRAM COMPLETE
AR max vel: 1.49 cm2
AV Area VTI: 1.59 cm2
AV Area mean vel: 1.59 cm2
AV Mean grad: 8.2 mmHg
AV Peak grad: 16.9 mmHg
Ao pk vel: 2.05 m/s
Area-P 1/2: 2 cm2
MV VTI: 1.02 cm2
S' Lateral: 3.3 cm

## 2020-10-01 ENCOUNTER — Other Ambulatory Visit: Payer: Self-pay | Admitting: Interventional Cardiology

## 2020-10-01 DIAGNOSIS — I4891 Unspecified atrial fibrillation: Secondary | ICD-10-CM

## 2020-10-01 DIAGNOSIS — I639 Cerebral infarction, unspecified: Secondary | ICD-10-CM

## 2020-10-20 NOTE — Progress Notes (Signed)
Cardiology Office Note:    Date:  10/21/2020   ID:  Zachary [name], DOB [birth year], MRN [id number]  PCP:  Maude Leriche, PA-C  Cardiologist:  Sinclair Grooms, MD   Referring MD: Maude Leriche, [place]   Chief Complaint  Patient presents with   Coronary Artery Disease    History of Present Illness:    Zachary [name] is a 75 y.o. male with a hx of CAD s/p stent placement in 2006, HFpEF with LVEF 55-65% on August 2017 LHC, HTN, OSA on CPAP, and DM2.  Aortic valve replacement December 2016 using [facility] Ease pericardial valve.  He is doing well.  He denies angina.  No chest pain.  No orthopnea or PND.  Palpitations are not a problem.  He has not had syncope.  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  Vascular work-up was performed and ended up being negative relative to carotid Doppler, monitoring, and echo.  My judgment was to have the patient continue aspirin 81 mg/day.  Past Medical History:  Diagnosis Date   Aortic valvar stenosis    Arthritis    Back pain    Erectile dysfunction    GERD (gastroesophageal reflux disease)    HTN (hypertension)    Hypercholesteremia    Urinary frequency     Past Surgical History:  Procedure Laterality Date   AORTIC VALVE REPLACEMENT N/A 03/25/2015   Procedure: AORTIC VALVE REPLACEMENT (AVR);  Surgeon: Gaye Pollack, MD;  Location: [place];  Service: Open Heart Surgery;  Laterality: N/A;   CARDIAC CATHETERIZATION N/A 03/10/2015   Procedure: Right/Left Heart Cath and Coronary Angiography;  Surgeon: Belva Crome, MD;  Location: [place] [facility];  Service: Cardiovascular;  Laterality: N/A;   CARDIAC CATHETERIZATION N/A 03/10/2015   Procedure: Intravascular Pressure Wire/FFR Study;  Surgeon: Belva Crome, MD;  Location: [place] [facility];  Service: Cardiovascular;  Laterality: N/A;   LUMBAR DISC SURGERY     TEE WITHOUT CARDIOVERSION N/A 03/25/2015   Procedure: TRANSESOPHAGEAL ECHOCARDIOGRAM  (TEE);  Surgeon: Gaye Pollack, MD;  Location: [place];  Service: Open Heart Surgery;  Laterality: N/A;   TONSILLECTOMY      Current Medications: Current Meds  Medication Sig   acetaminophen (TYLENOL) 325 MG tablet Take 650 mg by mouth every 6 (six) hours as needed for mild pain.   amoxicillin (AMOXIL) 500 MG tablet Take 2,000 mg by mouth as needed (Take one (1) hour prior to dental procedures.).   aspirin 81 MG EC tablet Take 1 tablet (81 mg total) by mouth daily.   atorvastatin (LIPITOR) 40 MG tablet Take 40 mg by mouth daily.   metoprolol succinate (TOPROL-XL) 25 MG 24 hr tablet TAKE 1 TABLET BY MOUTH  DAILY     Allergies:   Patient has no known allergies.   Social History   Socioeconomic History   Marital status: Married    Spouse name: Not on file   Number of children: Not on file   Years of education: Not on file   Highest education level: Not on file  Occupational History   Not on file  Tobacco Use   Smoking status: Never   Smokeless tobacco: Never  Vaping Use   Vaping Use: Never used  Substance and Sexual Activity   Alcohol use: No    Alcohol/week: 0.0 standard drinks   Drug use: No   Sexual activity: Not on file  Other Topics Concern   Not  on file  Social History Narrative   Not on file   Social Determinants of Health   Financial Resource Strain: Not on file  Food Insecurity: Not on file  Transportation Needs: Not on file  Physical Activity: Not on file  Stress: Not on file  Social Connections: Not on file     Family History: The patient's family history includes Cancer in his father; Diabetes in his mother; Healthy in his sister, sister, and sister; Heart attack in his mother; Heart disease in his mother; Hyperlipidemia in his brother; Hypertension in his mother; Stroke in his mother.  ROS:   Please see the history of present illness.    Complaints.  Ambulates without limitation.  All other systems reviewed and are negative.  EKGs/Labs/Other Studies  Reviewed:    The following studies were reviewed today:  ECHOCARDIOGRAM 09/21/2020 IMPRESSIONS     1. Left ventricular ejection fraction, by estimation, is 50 to 55%. The  left ventricle has low normal function. The left ventricle has no regional  wall motion abnormalities. There is moderate left ventricular hypertrophy.  not assessed d/t frequent PVC's   and indeterminate rhythm.   2. Right ventricular systolic function is low normal. The right  ventricular size is normal.   3. Left atrial size was moderately dilated.   4. The mitral valve is abnormal. Trivial mitral valve regurgitation.  Possible mild mitral stenosis. The mean mitral valve gradient is 9.0 mmHg.  Although the gradient at worse was 9 mmHg, the degree of stenosis appears  at worst mild and is affected by  frequent PVC's and doppler position.   5. The aortic valve has been repaired/replaced. Aortic valve  regurgitation is trivial. There is a 27 mm Edwards Magna-Ease  bioprosthetic valve present in the aortic position. Aortic valve mean  gradient measures 8.2 mmHg.   6. Aortic dilatation noted. There is mild dilatation of the aortic root,  measuring 43 mm. There is mild dilatation of the ascending aorta,  measuring 42 mm.   Comparison(s): No significant change from prior study. 01/07/2018: LVEF  50-55%, mean Aov gradient 6 mmHg, severe LAE.  MONITOR 10/03/2020 Study Highlights  Normal sinust rhythm Brief nonsustained SVT PVC burden 4%. SVE burden 9%. No atrial fibrillation.   BILATERAL CAROTID DOPPLER  09/02/2020  Summary:  Right Carotid: Velocities in the right ICA are consistent with a 1-39%  stenosis.   Left Carotid: The extracranial vessels were near-normal with only minimal  wall                thickening or plaque.   Vertebrals:  Bilateral vertebral arteries demonstrate antegrade flow.  Subclavians: Normal flow hemodynamics were seen in bilateral subclavian               arteries.    EKG:  EKG  normal sinus rhythm, inferior Q waves, left atrial abnormality.  Recent Labs: No results found for requested labs within last 8760 hours.  Recent Lipid Panel No results found for: CHOL, TRIG, HDL, CHOLHDL, VLDL, LDLCALC, LDLDIRECT  Physical Exam:    VS:  BP (!) 122/54   Pulse 73   Ht 6' (1.829 m)   Wt 201 lb 12.8 oz (91.5 kg)   SpO2 96%   BMI 27.37 kg/m     Wt Readings from Last 3 Encounters:  10/21/20 201 lb 12.8 oz (91.5 kg)  07/31/19 214 lb 9.6 oz (97.3 kg)  06/27/18 213 lb 6.4 oz (96.8 kg)     GEN: Mildly overweight.  No acute distress HEENT: Normal NECK: No JVD. LYMPHATICS: No lymphadenopathy CARDIAC: No murmur. RRR no gallop, or edema. VASCULAR:  Normal Pulses. No bruits. RESPIRATORY:  Clear to auscultation without rales, wheezing or rhonchi  ABDOMEN: Soft, non-tender, non-distended, No pulsatile mass, MUSCULOSKELETAL: No deformity  SKIN: Warm and dry NEUROLOGIC:  Alert and oriented x 3 PSYCHIATRIC:  Normal affect   ASSESSMENT:    1. Coronary artery disease of native artery of native heart with stable angina pectoris ([place])   2. S/P AVR   3. Mixed hyperlipidemia   4. Retinal embolus, left eye   5. Cerebrovascular accident (CVA), unspecified mechanism ([place])    PLAN:    In order of problems listed above:  Secondary prevention discussed.  He needs to increase physical activity. Valve is functioning normally and has normal auscultation Lipids are at target. Left eye embolus without obvious cause found.  Continue aspirin 81 mg/day.  A recurrent event would require that we escalate antithrombotic/antiplatelet therapy. Same as #4.   Overall education and awareness concerning primary/secondary risk prevention was discussed in detail: LDL less than 70, hemoglobin A1c less than 7, blood pressure target less than 130/80 mmHg, >150 minutes of moderate aerobic activity per week, avoidance of smoking, weight control (via diet and exercise), and continued  surveillance/management of/for obstructive sleep apnea.   Since I will likely be retiring a year from now, I have recommended Dr. Marlou Porch.   Medication Adjustments/Labs and Tests Ordered: Current medicines are reviewed at length with the patient today.  Concerns regarding medicines are outlined above.  Orders Placed This Encounter  Procedures   EKG 12-Lead   No orders of the defined types were placed in this encounter.   Patient Instructions  Medication Instructions:  Your physician recommends that you continue on your current medications as directed. Please refer to the Current Medication list given to you today.  *If you need a refill on your cardiac medications before your next appointment, please call your pharmacy*   Lab Work: None If you have labs (blood work) drawn today and your tests are completely normal, you will receive your results only by: [place] (if you have MyChart) OR A paper copy in the mail If you have any lab test that is abnormal or we need to change your treatment, we will call you to review the results.   Testing/Procedures: None   Follow-Up: At [facility], you and your health needs are our priority.  As part of our continuing mission to provide you with exceptional heart care, we have created designated Provider Care Teams.  These Care Teams include your primary Cardiologist (physician) and Advanced Practice Providers (APPs -  Physician Assistants and Nurse Practitioners) who all work together to provide you with the care you need, when you need it.  We recommend signing up for the patient portal called "MyChart".  Sign up information is provided on this After Visit Summary.  MyChart is used to connect with patients for Virtual Visits (Telemedicine).  Patients are able to view lab/test results, encounter notes, upcoming appointments, etc.  Non-urgent messages can be sent to your provider as well.   To learn more about what you can do with  MyChart, go to [email or web address].    Your next appointment:   1 year(s)  The format for your next appointment:   In Person  Provider:   You may see Candee Furbish, MD or one of the following Advanced Practice Providers on your designated Care Team:   Sharee Pimple  Glenford Peers, NP   Other Instructions     Signed, Sinclair Grooms, MD  10/21/2020 9:47 AM    [facility]

## 2020-10-21 ENCOUNTER — Other Ambulatory Visit: Payer: Self-pay

## 2020-10-21 ENCOUNTER — Ambulatory Visit: Payer: Medicare Other | Admitting: Interventional Cardiology

## 2020-10-21 ENCOUNTER — Encounter: Payer: Self-pay | Admitting: Interventional Cardiology

## 2020-10-21 VITALS — BP 122/54 | HR 73 | Ht 72.0 in | Wt 201.8 lb

## 2020-10-21 DIAGNOSIS — Z952 Presence of prosthetic heart valve: Secondary | ICD-10-CM

## 2020-10-21 DIAGNOSIS — E782 Mixed hyperlipidemia: Secondary | ICD-10-CM

## 2020-10-21 DIAGNOSIS — H349 Unspecified retinal vascular occlusion: Secondary | ICD-10-CM | POA: Diagnosis not present

## 2020-10-21 DIAGNOSIS — I25118 Atherosclerotic heart disease of native coronary artery with other forms of angina pectoris: Secondary | ICD-10-CM

## 2020-10-21 DIAGNOSIS — I639 Cerebral infarction, unspecified: Secondary | ICD-10-CM

## 2020-10-21 NOTE — Patient Instructions (Signed)
Medication Instructions:  Your physician recommends that you continue on your current medications as directed. Please refer to the Current Medication list given to you today.  *If you need a refill on your cardiac medications before your next appointment, please call your pharmacy*   Lab Work: None If you have labs (blood work) drawn today and your tests are completely normal, you will receive your results only by: [place] (if you have MyChart) OR A paper copy in the mail If you have any lab test that is abnormal or we need to change your treatment, we will call you to review the results.   Testing/Procedures: None   Follow-Up: At [facility], you and your health needs are our priority.  As part of our continuing mission to provide you with exceptional heart care, we have created designated Provider Care Teams.  These Care Teams include your primary Cardiologist (physician) and Advanced Practice Providers (APPs -  Physician Assistants and Nurse Practitioners) who all work together to provide you with the care you need, when you need it.  We recommend signing up for the patient portal called "MyChart".  Sign up information is provided on this After Visit Summary.  MyChart is used to connect with patients for Virtual Visits (Telemedicine).  Patients are able to view lab/test results, encounter notes, upcoming appointments, etc.  Non-urgent messages can be sent to your provider as well.   To learn more about what you can do with MyChart, go to [email or web address].    Your next appointment:   1 year(s)  The format for your next appointment:   In Person  Provider:   You may see Candee Furbish, MD or one of the following Advanced Practice Providers on your designated Care Team:   Kathyrn Drown, NP   Other Instructions

## 2021-01-06 DIAGNOSIS — I25119 Atherosclerotic heart disease of native coronary artery with unspecified angina pectoris: Secondary | ICD-10-CM | POA: Diagnosis not present

## 2021-01-06 DIAGNOSIS — Z23 Encounter for immunization: Secondary | ICD-10-CM | POA: Diagnosis not present

## 2021-01-06 DIAGNOSIS — R7303 Prediabetes: Secondary | ICD-10-CM | POA: Diagnosis not present

## 2021-01-06 DIAGNOSIS — E7800 Pure hypercholesterolemia, unspecified: Secondary | ICD-10-CM | POA: Diagnosis not present

## 2021-01-06 DIAGNOSIS — K219 Gastro-esophageal reflux disease without esophagitis: Secondary | ICD-10-CM | POA: Diagnosis not present

## 2021-01-06 DIAGNOSIS — N183 Chronic kidney disease, stage 3 unspecified: Secondary | ICD-10-CM | POA: Diagnosis not present

## 2021-01-06 DIAGNOSIS — I10 Essential (primary) hypertension: Secondary | ICD-10-CM | POA: Diagnosis not present

## 2021-01-06 DIAGNOSIS — M1612 Unilateral primary osteoarthritis, left hip: Secondary | ICD-10-CM | POA: Diagnosis not present

## 2021-06-03 ENCOUNTER — Other Ambulatory Visit: Payer: Self-pay | Admitting: *Deleted

## 2021-06-03 MED ORDER — METOPROLOL SUCCINATE ER 25 MG PO TB24: 25.0000 mg | ORAL_TABLET | Freq: Every day | ORAL | 0 refills | Status: DC

## 2021-07-11 DIAGNOSIS — I25119 Atherosclerotic heart disease of native coronary artery with unspecified angina pectoris: Secondary | ICD-10-CM | POA: Diagnosis not present

## 2021-07-11 DIAGNOSIS — Z23 Encounter for immunization: Secondary | ICD-10-CM | POA: Diagnosis not present

## 2021-07-11 DIAGNOSIS — Z0000 Encounter for general adult medical examination without abnormal findings: Secondary | ICD-10-CM | POA: Diagnosis not present

## 2021-07-11 DIAGNOSIS — N183 Chronic kidney disease, stage 3 unspecified: Secondary | ICD-10-CM | POA: Diagnosis not present

## 2021-07-11 DIAGNOSIS — K219 Gastro-esophageal reflux disease without esophagitis: Secondary | ICD-10-CM | POA: Diagnosis not present

## 2021-07-11 DIAGNOSIS — I10 Essential (primary) hypertension: Secondary | ICD-10-CM | POA: Diagnosis not present

## 2021-07-11 DIAGNOSIS — M1612 Unilateral primary osteoarthritis, left hip: Secondary | ICD-10-CM | POA: Diagnosis not present

## 2021-07-11 DIAGNOSIS — E7800 Pure hypercholesterolemia, unspecified: Secondary | ICD-10-CM | POA: Diagnosis not present

## 2021-07-11 DIAGNOSIS — R7303 Prediabetes: Secondary | ICD-10-CM | POA: Diagnosis not present

## 2021-08-30 DIAGNOSIS — Z961 Presence of intraocular lens: Secondary | ICD-10-CM | POA: Diagnosis not present

## 2021-10-18 NOTE — Progress Notes (Signed)
Cardiology Office Note:    Date:  10/21/2021   ID:  Zachary [name], DOB August 20, 1945, MRN [id number]  PCP:  Maude Leriche, PA-C (Inactive)  Cardiologist:  Sinclair Grooms, MD   Referring MD: Maude Leriche, PA-C   Chief Complaint  Patient presents with   Coronary Artery Disease   Cardiac Valve Problem    History of Present Illness:    Zachary [name] is a 76 y.o. male with a hx of CAD s/p stent placement in 2006, HFpEF with LVEF 55-65% on August 2016 LHC, HTN, OSA on CPAP, and DM2.  Aortic valve replacement December 2016 using [employer] Ease pericardial valve.  Does not have cardiovascular complaints.  He is very sedentary.  Not much aerobic activity at all.  Has history of CAD.  Denies orthopnea, PND, palpitations.  Past Medical History:  Diagnosis Date   Aortic valvar stenosis    Arthritis    Back pain    Erectile dysfunction    GERD (gastroesophageal reflux disease)    HTN (hypertension)    Hypercholesteremia    Urinary frequency     Past Surgical History:  Procedure Laterality Date   AORTIC VALVE REPLACEMENT N/A 03/25/2015   Procedure: AORTIC VALVE REPLACEMENT (AVR);  Surgeon: Gaye Pollack, MD;  Location: [place];  Service: Open Heart Surgery;  Laterality: N/A;   CARDIAC CATHETERIZATION N/A 03/10/2015   Procedure: Right/Left Heart Cath and Coronary Angiography;  Surgeon: Belva Crome, MD;  Location: [place] [facility];  Service: Cardiovascular;  Laterality: N/A;   CARDIAC CATHETERIZATION N/A 03/10/2015   Procedure: Intravascular Pressure Wire/FFR Study;  Surgeon: Belva Crome, MD;  Location: [place] [facility];  Service: Cardiovascular;  Laterality: N/A;   LUMBAR DISC SURGERY     TEE WITHOUT CARDIOVERSION N/A 03/25/2015   Procedure: TRANSESOPHAGEAL ECHOCARDIOGRAM (TEE);  Surgeon: Gaye Pollack, MD;  Location: [place];  Service: Open Heart Surgery;  Laterality: N/A;   TONSILLECTOMY      Current Medications: Current Meds  Medication Sig   acetaminophen  (TYLENOL) 325 MG tablet Take 650 mg by mouth every 6 (six) hours as needed for mild pain.   amoxicillin (AMOXIL) 500 MG tablet Take 2,000 mg by mouth as needed (Take one (1) hour prior to dental procedures.).   aspirin 81 MG EC tablet Take 1 tablet (81 mg total) by mouth daily.   atorvastatin (LIPITOR) 40 MG tablet Take 40 mg by mouth daily.   metoprolol succinate (TOPROL-XL) 25 MG 24 hr tablet Take 1 tablet (25 mg total) by mouth daily.   traMADol (ULTRAM) 50 MG tablet Take 50 mg by mouth as needed.     Allergies:   Patient has no known allergies.   Social History   Socioeconomic History   Marital status: Married    Spouse name: Not on file   Number of children: Not on file   Years of education: Not on file   Highest education level: Not on file  Occupational History   Not on file  Tobacco Use   Smoking status: Never   Smokeless tobacco: Never  Vaping Use   Vaping Use: Never used  Substance and Sexual Activity   Alcohol use: No    Alcohol/week: 0.0 standard drinks of alcohol   Drug use: No   Sexual activity: Not on file  Other Topics Concern   Not on file  Social History Narrative   Not on file   Social Determinants of Health   Financial  Resource Strain: Not on file  Food Insecurity: Not on file  Transportation Needs: Not on file  Physical Activity: Not on file  Stress: Not on file  Social Connections: Not on file     Family History: The patient's family history includes Cancer in his father; Diabetes in his mother; Healthy in his sister, sister, and sister; Heart attack in his mother; Heart disease in his mother; Hyperlipidemia in his brother; Hypertension in his mother; Stroke in his mother.  ROS:   Please see the history of present illness.    Concerned about whether he could have any progression of coronary disease.  Not having angina.  Some concerns about his blood pressure stating that it is elevated in the morning in the range of 125/85 before he takes his  medication and later in the day much lower.  All other systems reviewed and are negative.  EKGs/Labs/Other Studies Reviewed:    The following studies were reviewed today: 2022 2D Doppler echocardiogram: IMPRESSIONS     1. Left ventricular ejection fraction, by estimation, is 50 to 55%. The  left ventricle has low normal function. The left ventricle has no regional  wall motion abnormalities. There is moderate left ventricular hypertrophy.  not assessed d/t frequent PVC's   and indeterminate rhythm.   2. Right ventricular systolic function is low normal. The right  ventricular size is normal.   3. Left atrial size was moderately dilated.   4. The mitral valve is abnormal. Trivial mitral valve regurgitation.  Possible mild mitral stenosis. The mean mitral valve gradient is 9.0 mmHg.  Although the gradient at worse was 9 mmHg, the degree of stenosis appears  at worst mild and is affected by  frequent PVC's and doppler position.   5. The aortic valve has been repaired/replaced. Aortic valve  regurgitation is trivial. There is a 27 mm [employer] Magna-Ease  bioprosthetic valve present in the aortic position. Aortic valve mean  gradient measures 8.2 mmHg.   6. Aortic dilatation noted. There is mild dilatation of the aortic root,  measuring 43 mm. There is mild dilatation of the ascending aorta,  measuring 42 mm.   Comparison(s): No significant change from prior study. 01/07/2018: LVEF  50-55%, mean Aov gradient 6 mmHg, severe LAE,.    Coronary angiography 2016: Coronary Diagrams  Diagnostic Dominance: Right   EKG:  EKG normal sinus rhythm, left atrial abnormality, old inferior Q waves.  No changes noted when compared to 2022.  Recent Labs: No results found for requested labs within last 365 days.  Recent Lipid Panel No results found for: "CHOL", "TRIG", "HDL", "CHOLHDL", "VLDL", "LDLCALC", "LDLDIRECT"  Physical Exam:    VS:  BP 124/84   Pulse 77   Ht 6' (1.829 m)   Wt 202  lb 3.2 oz (91.7 kg)   SpO2 96%   BMI 27.42 kg/m     Wt Readings from Last 3 Encounters:  10/21/21 202 lb 3.2 oz (91.7 kg)  10/21/20 201 lb 12.8 oz (91.5 kg)  07/31/19 214 lb 9.6 oz (97.3 kg)     GEN: Not significantly overweight. No acute distress HEENT: Normal NECK: No JVD. LYMPHATICS: No lymphadenopathy CARDIAC: No murmur. RRR no gallop, or edema. VASCULAR:  Normal Pulses. No bruits. RESPIRATORY:  Clear to auscultation without rales, wheezing or rhonchi  ABDOMEN: Soft, non-tender, non-distended, No pulsatile mass, MUSCULOSKELETAL: No deformity  SKIN: Warm and dry NEUROLOGIC:  Alert and oriented x 3 PSYCHIATRIC:  Normal affect   ASSESSMENT:    1.  Coronary artery disease of native artery of native heart with stable angina pectoris ([place])   2. S/P AVR   3. Mixed hyperlipidemia   4. Cerebrovascular accident (CVA), unspecified mechanism ([place])   5. Retinal embolus, left eye    PLAN:    In order of problems listed above:  Lexiscan myocardial perfusion imaging to screen since he is sedentary. Valve function and auscultation seem to be normal.  Continue clinical follow-up and consider echo again in 1 to 2 years. Continue statin therapy. Continue antiplatelet therapy.  Overall education and awareness concerning primary/secondary risk prevention was discussed in detail: LDL less than 70, hemoglobin A1c less than 7, blood pressure target less than 130/80 mmHg, >150 minutes of moderate aerobic activity per week, avoidance of smoking, weight control (via diet and exercise), and continued surveillance/management of/for obstructive sleep apnea.    Medication Adjustments/Labs and Tests Ordered: Current medicines are reviewed at length with the patient today.  Concerns regarding medicines are outlined above.  No orders of the defined types were placed in this encounter.  No orders of the defined types were placed in this encounter.   Patient Instructions  Medication Instructions:   Your physician recommends that you continue on your current medications as directed. Please refer to the Current Medication list given to you today.  *If you need a refill on your cardiac medications before your next appointment, please call your pharmacy*  Lab Work: NONE  Testing/Procedures: Your physician has requested that you have a lexiscan myoview. For further information please visit [email or web address]. Please follow instruction sheet, as given.   Follow-Up: At [facility], you and your health needs are our priority.  As part of our continuing mission to provide you with exceptional heart care, we have created designated Provider Care Teams.  These Care Teams include your primary Cardiologist (physician) and Advanced Practice Providers (APPs -  Physician Assistants and Nurse Practitioners) who all work together to provide you with the care you need, when you need it.  Your next appointment:   1 year(s)  The format for your next appointment:   In Person  Provider:   Sinclair Grooms, MD {  Other Instructions How to prepare for your Myocardial Perfusion Test: Do not eat or drink 3 hours prior to your test, except you may have water. Do not consume products containing caffeine (regular or decaffeinated) 12 hours prior to your test. (ex: coffee, chocolate, sodas, tea). Do bring a list of your current medications with you.  If not listed below, you may take your medications as normal. Do wear comfortable clothes (no dresses or overalls) and walking shoes, tennis shoes preferred (No heels or open toe shoes are allowed). Do NOT wear cologne, perfume, aftershave, or lotions (deodorant is allowed). If these instructions are not followed, your test will have to be rescheduled.  Important Information About Sugar         Signed, Sinclair Grooms, MD  10/21/2021 9:28 AM    [facility]

## 2021-10-21 ENCOUNTER — Encounter: Payer: Self-pay | Admitting: Interventional Cardiology

## 2021-10-21 ENCOUNTER — Ambulatory Visit: Payer: Medicare Other | Admitting: Interventional Cardiology

## 2021-10-21 VITALS — BP 124/84 | HR 77 | Ht 72.0 in | Wt 202.2 lb

## 2021-10-21 DIAGNOSIS — Z952 Presence of prosthetic heart valve: Secondary | ICD-10-CM

## 2021-10-21 DIAGNOSIS — E782 Mixed hyperlipidemia: Secondary | ICD-10-CM | POA: Diagnosis not present

## 2021-10-21 DIAGNOSIS — I25118 Atherosclerotic heart disease of native coronary artery with other forms of angina pectoris: Secondary | ICD-10-CM | POA: Diagnosis not present

## 2021-10-21 DIAGNOSIS — H349 Unspecified retinal vascular occlusion: Secondary | ICD-10-CM

## 2021-10-21 DIAGNOSIS — I639 Cerebral infarction, unspecified: Secondary | ICD-10-CM

## 2021-10-21 NOTE — Patient Instructions (Addendum)
Medication Instructions:  Your physician recommends that you continue on your current medications as directed. Please refer to the Current Medication list given to you today.  *If you need a refill on your cardiac medications before your next appointment, please call your pharmacy*  Lab Work: NONE  Testing/Procedures: Your physician has requested that you have a lexiscan myoview. For further information please visit [email or web address]. Please follow instruction sheet, as given.   Follow-Up: At [facility], you and your health needs are our priority.  As part of our continuing mission to provide you with exceptional heart care, we have created designated Provider Care Teams.  These Care Teams include your primary Cardiologist (physician) and Advanced Practice Providers (APPs -  Physician Assistants and Nurse Practitioners) who all work together to provide you with the care you need, when you need it.  Your next appointment:   1 year(s)  The format for your next appointment:   In Person  Provider:   Sinclair Grooms, MD {  Other Instructions How to prepare for your Myocardial Perfusion Test: Do not eat or drink 3 hours prior to your test, except you may have water. Do not consume products containing caffeine (regular or decaffeinated) 12 hours prior to your test. (ex: coffee, chocolate, sodas, tea). Do bring a list of your current medications with you.  If not listed below, you may take your medications as normal. Do wear comfortable clothes (no dresses or overalls) and walking shoes, tennis shoes preferred (No heels or open toe shoes are allowed). Do NOT wear cologne, perfume, aftershave, or lotions (deodorant is allowed). If these instructions are not followed, your test will have to be rescheduled.  Important Information About Sugar

## 2021-11-02 ENCOUNTER — Telehealth (HOSPITAL_COMMUNITY): Payer: Self-pay | Admitting: *Deleted

## 2021-11-02 ENCOUNTER — Encounter (HOSPITAL_COMMUNITY): Payer: Self-pay | Admitting: *Deleted

## 2021-11-02 NOTE — Telephone Encounter (Signed)
Instructions for upcoming stress test sent to my chart per request of patient.  Zachary [name]

## 2021-11-07 ENCOUNTER — Ambulatory Visit (HOSPITAL_COMMUNITY): Payer: Medicare Other | Attending: Cardiovascular Disease

## 2021-11-07 DIAGNOSIS — I25118 Atherosclerotic heart disease of native coronary artery with other forms of angina pectoris: Secondary | ICD-10-CM | POA: Insufficient documentation

## 2021-11-07 LAB — MYOCARDIAL PERFUSION IMAGING
LV dias vol: 103 mL (ref 62–150)
LV sys vol: 44 mL
Nuc Stress EF: 57 %
Peak HR: 89 {beats}/min
Rest HR: 65 {beats}/min
Rest Nuclear Isotope Dose: 10.3 mCi
SDS: 1
SRS: 1
SSS: 2
ST Depression (mm): 0 mm
Stress Nuclear Isotope Dose: 31.2 mCi
TID: 0.99

## 2021-11-07 MED ORDER — TECHNETIUM TC 99M TETROFOSMIN IV KIT
10.3000 | PACK | Freq: Once | INTRAVENOUS | Status: AC | PRN
  Administered 2021-11-07: 10.3 via INTRAVENOUS

## 2021-11-07 MED ORDER — TECHNETIUM TC 99M TETROFOSMIN IV KIT
31.2000 | PACK | Freq: Once | INTRAVENOUS | Status: AC | PRN
  Administered 2021-11-07: 31.2 via INTRAVENOUS

## 2021-11-07 MED ORDER — REGADENOSON 0.4 MG/5ML IV SOLN
0.4000 mg | Freq: Once | INTRAVENOUS | Status: AC
  Administered 2021-11-07: 0.4 mg via INTRAVENOUS

## 2021-11-28 DIAGNOSIS — M79642 Pain in left hand: Secondary | ICD-10-CM | POA: Diagnosis not present

## 2021-11-28 DIAGNOSIS — K111 Hypertrophy of salivary gland: Secondary | ICD-10-CM | POA: Diagnosis not present

## 2021-11-28 DIAGNOSIS — M19049 Primary osteoarthritis, unspecified hand: Secondary | ICD-10-CM | POA: Diagnosis not present

## 2021-11-28 DIAGNOSIS — M79641 Pain in right hand: Secondary | ICD-10-CM | POA: Diagnosis not present

## 2022-01-12 DIAGNOSIS — I25119 Atherosclerotic heart disease of native coronary artery with unspecified angina pectoris: Secondary | ICD-10-CM | POA: Diagnosis not present

## 2022-01-12 DIAGNOSIS — D1721 Benign lipomatous neoplasm of skin and subcutaneous tissue of right arm: Secondary | ICD-10-CM | POA: Diagnosis not present

## 2022-01-12 DIAGNOSIS — E7800 Pure hypercholesterolemia, unspecified: Secondary | ICD-10-CM | POA: Diagnosis not present

## 2022-01-12 DIAGNOSIS — I10 Essential (primary) hypertension: Secondary | ICD-10-CM | POA: Diagnosis not present

## 2022-07-13 DIAGNOSIS — N183 Chronic kidney disease, stage 3 unspecified: Secondary | ICD-10-CM | POA: Diagnosis not present

## 2022-07-13 DIAGNOSIS — Z0000 Encounter for general adult medical examination without abnormal findings: Secondary | ICD-10-CM | POA: Diagnosis not present

## 2022-07-13 DIAGNOSIS — Z952 Presence of prosthetic heart valve: Secondary | ICD-10-CM | POA: Diagnosis not present

## 2022-07-13 DIAGNOSIS — I10 Essential (primary) hypertension: Secondary | ICD-10-CM | POA: Diagnosis not present

## 2022-07-13 DIAGNOSIS — R7303 Prediabetes: Secondary | ICD-10-CM | POA: Diagnosis not present

## 2022-07-13 DIAGNOSIS — K219 Gastro-esophageal reflux disease without esophagitis: Secondary | ICD-10-CM | POA: Diagnosis not present

## 2022-07-13 DIAGNOSIS — I25119 Atherosclerotic heart disease of native coronary artery with unspecified angina pectoris: Secondary | ICD-10-CM | POA: Diagnosis not present

## 2022-07-13 DIAGNOSIS — E7800 Pure hypercholesterolemia, unspecified: Secondary | ICD-10-CM | POA: Diagnosis not present

## 2022-09-01 DIAGNOSIS — Z961 Presence of intraocular lens: Secondary | ICD-10-CM | POA: Diagnosis not present

## 2022-12-07 NOTE — Progress Notes (Signed)
Cardiology Office Note   Date:  12/08/2022   ID:  Zachary [name], DOB 04-02-1946, MRN [id number]  PCP:  Scifres, Nicole Cella, PA-C (Inactive)    No chief complaint on file.  S/p AVR  Wt Readings from Last 3 Encounters:  12/08/22 211 lb 3.2 oz (95.8 kg)  11/07/21 202 lb (91.6 kg)  10/21/21 202 lb 3.2 oz (91.7 kg)       History of Present Illness: Zachary [name] is a 77 y.o. male  former patient of Dr. Katrinka Blazing with a hx of CAD, HFpEF with LVEF 55-65% on August 2016 LHC, HTN, OSA on CPAP, and DM2.  Aortic valve replacement December 2016 using [facility] Ease pericardial valve.   Of note, there is some mention of 2006 RCA stent but patient states he did not have a stent placed.   Denies : Chest pain. Dizziness. Leg edema. Nitroglycerin use. Orthopnea. Palpitations. Paroxysmal nocturnal dyspnea. Shortness of breath. Syncope.      Past Medical History:  Diagnosis Date   Aortic valvar stenosis    Arthritis    Back pain    Erectile dysfunction    GERD (gastroesophageal reflux disease)    HTN (hypertension)    Hypercholesteremia    Urinary frequency     Past Surgical History:  Procedure Laterality Date   AORTIC VALVE REPLACEMENT N/A 03/25/2015   Procedure: AORTIC VALVE REPLACEMENT (AVR);  Surgeon: Alleen Borne, MD;  Location: [facility];  Service: Open Heart Surgery;  Laterality: N/A;   CARDIAC CATHETERIZATION N/A 03/10/2015   Procedure: Right/Left Heart Cath and Coronary Angiography;  Surgeon: Lyn Records, MD;  Location: [facility] INVASIVE CV LAB;  Service: Cardiovascular;  Laterality: N/A;   CARDIAC CATHETERIZATION N/A 03/10/2015   Procedure: Intravascular Pressure Wire/FFR Study;  Surgeon: Lyn Records, MD;  Location: [facility] INVASIVE CV LAB;  Service: Cardiovascular;  Laterality: N/A;   LUMBAR DISC SURGERY     TEE WITHOUT CARDIOVERSION N/A 03/25/2015   Procedure: TRANSESOPHAGEAL ECHOCARDIOGRAM (TEE);  Surgeon: Alleen Borne, MD;  Location: [facility] OR;  Service: Open Heart Surgery;   Laterality: N/A;   TONSILLECTOMY       Current Outpatient Medications  Medication Sig Dispense Refill   acetaminophen (TYLENOL) 325 MG tablet Take 650 mg by mouth every 6 (six) hours as needed for mild pain.     amoxicillin (AMOXIL) 500 MG tablet Take 2,000 mg by mouth as needed (Take one (1) hour prior to dental procedures.).     aspirin 81 MG EC tablet Take 1 tablet (81 mg total) by mouth daily.     atorvastatin (LIPITOR) 40 MG tablet Take 40 mg by mouth daily.     metoprolol succinate (TOPROL-XL) 25 MG 24 hr tablet Take 1 tablet (25 mg total) by mouth daily. 90 tablet 0   No current facility-administered medications for this visit.    Allergies:   Patient has no known allergies.    Social History:  The patient  reports that he has never smoked. He has never used smokeless tobacco. He reports that he does not drink alcohol and does not use drugs.   Family History:  The patient's family history includes Cancer in his father; Diabetes in his mother; Healthy in his sister, sister, and sister; Heart attack in his mother; Heart disease in his mother; Hyperlipidemia in his brother; Hypertension in his mother; Stroke in his mother.    ROS:  Please see the history of present illness.   Otherwise, review of systems  are positive for arthritis in his wrist and shoulders, hips.   All other systems are reviewed and negative.    PHYSICAL EXAM: VS:  BP 128/80   Pulse 70   Ht 5\' 11"  (1.803 m)   Wt 211 lb 3.2 oz (95.8 kg)   SpO2 94%   BMI 29.46 kg/m  , BMI Body mass index is 29.46 kg/m. GEN: Well nourished, well developed, in no acute distress HEENT: normal Neck: no JVD, carotid bruits, or masses Cardiac: RRR; no murmurs, rubs, or gallops,no edema  Respiratory:  clear to auscultation bilaterally, normal work of breathing GI: soft, nontender, nondistended, + BS MS: no deformity or atrophy Skin: warm and dry, no rash Neuro:  Strength and sensation are intact Psych: euthymic mood, full  affect   EKG:   The ekg ordered today demonstrates NSR, nonspecific ST changes   Recent Labs: No results found for requested labs within last 365 days.   Lipid Panel No results found for: "CHOL", "TRIG", "HDL", "CHOLHDL", "VLDL", "LDLCALC", "LDLDIRECT"   Other studies Reviewed: Additional studies/ records that were reviewed today with results demonstrating: labs reviewed    ASSESSMENT AND PLAN:  CAD: prior PCI. Moderate LAD disease, negative by FFR in 2016.  Continue medical therapy. S/p AVR: needs SBE prophylaxis. Last echo showed in 2022, normal LV function and normal prosthetic valve function.  No CHF sx.  Appears euvolemic.  Hyperlipidemia:Continue atorvastatin.  LDL 76 in 06/2022. CVA: left eye retinal embolus. No visual issues.  PreDM: A1c 6.1.  Whole food plant-based diet.  High-fiber diet.  Avoid processed foods.  Avoid added sugars.  Consider adding metformin to help prevent prediabetes from progressing all the way to full diabetes.   Current medicines are reviewed at length with the patient today.  The patient concerns regarding his medicines were addressed.  The following changes have been made:  No change  Labs/ tests ordered today include:   Orders Placed This Encounter  Procedures   EKG 12-Lead    Recommend 150 minutes/week of aerobic exercise Low fat, low carb, high fiber diet recommended  Disposition:   FU in 1 year with echo, prefers Monday, Thurs, Fri   Signed, Lance Muss, MD  12/08/2022 9:52 AM    [facility] [address] [place], [place], [place]  [address] Phone: [phone]; Fax: [phone]

## 2022-12-08 ENCOUNTER — Encounter: Payer: Self-pay | Admitting: Interventional Cardiology

## 2022-12-08 ENCOUNTER — Ambulatory Visit: Payer: Medicare Other | Admitting: Interventional Cardiology

## 2022-12-08 VITALS — BP 128/80 | HR 70 | Ht 71.0 in | Wt 211.2 lb

## 2022-12-08 DIAGNOSIS — E782 Mixed hyperlipidemia: Secondary | ICD-10-CM

## 2022-12-08 DIAGNOSIS — Z952 Presence of prosthetic heart valve: Secondary | ICD-10-CM

## 2022-12-08 DIAGNOSIS — H349 Unspecified retinal vascular occlusion: Secondary | ICD-10-CM

## 2022-12-08 DIAGNOSIS — I25118 Atherosclerotic heart disease of native coronary artery with other forms of angina pectoris: Secondary | ICD-10-CM | POA: Diagnosis not present

## 2022-12-08 NOTE — Patient Instructions (Signed)
Medication Instructions:  Your physician recommends that you continue on your current medications as directed. Please refer to the Current Medication list given to you today.  *If you need a refill on your cardiac medications before your next appointment, please call your pharmacy*   Lab Work: none If you have labs (blood work) drawn today and your tests are completely normal, you will receive your results only by: MyChart Message (if you have MyChart) OR A paper copy in the mail If you have any lab test that is abnormal or we need to change your treatment, we will call you to review the results.   Testing/Procedures: Your physician has requested that you have an echocardiogram. Echocardiography is a painless test that uses sound waves to create images of your heart. It provides your doctor with information about the size and shape of your heart and how well your heart's chambers and valves are working. This procedure takes approximately one hour. There are no restrictions for this procedure. Please do NOT wear cologne, perfume, aftershave, or lotions (deodorant is allowed). Please arrive 15 minutes prior to your appointment time. To be done in one year   Follow-Up: At [facility], you and your health needs are our priority.  As part of our continuing mission to provide you with exceptional heart care, we have created designated Provider Care Teams.  These Care Teams include your primary Cardiologist (physician) and Advanced Practice Providers (APPs -  Physician Assistants and Nurse Practitioners) who all work together to provide you with the care you need, when you need it.  We recommend signing up for the patient portal called "MyChart".  Sign up information is provided on this After Visit Summary.  MyChart is used to connect with patients for Virtual Visits (Telemedicine).  Patients are able to view lab/test results, encounter notes, upcoming appointments, etc.  Non-urgent  messages can be sent to your provider as well.   To learn more about what you can do with MyChart, go to [email or web address].    Your next appointment:   12 month(s)  Provider:   Dr Eldridge Dace    Other Instructions I would be happy to help recommend someone for you to see in the future who will take excellent care of you.  If you prefer to stay at our [facility] street office, I am referring my patient's to Drs. Ruben Im, [place] and [place] who are all excellent.  If you prefer to go to our [facility] office near [facility], I recommend seeing Drs. Cindie Crumbly and [place] who are all wonderful.  If you prefer to go to our [place] office, both Dr. Cristal Deer and Dr. Duke Salvia are great.  Once you choose your provider, feel free to call their office to schedule!  Wishing you all the best!  Sincerely,  Everette Rank, MD

## 2023-01-15 DIAGNOSIS — I25119 Atherosclerotic heart disease of native coronary artery with unspecified angina pectoris: Secondary | ICD-10-CM | POA: Diagnosis not present

## 2023-01-15 DIAGNOSIS — E7800 Pure hypercholesterolemia, unspecified: Secondary | ICD-10-CM | POA: Diagnosis not present

## 2023-01-15 DIAGNOSIS — I10 Essential (primary) hypertension: Secondary | ICD-10-CM | POA: Diagnosis not present

## 2023-01-15 DIAGNOSIS — Z23 Encounter for immunization: Secondary | ICD-10-CM | POA: Diagnosis not present

## 2023-02-09 ENCOUNTER — Inpatient Hospital Stay (HOSPITAL_COMMUNITY)
Admission: EM | Admit: 2023-02-09 | Discharge: 2023-02-12 | DRG: 040 | Disposition: A | Discharge status: Home / Self Care | Payer: Medicare Other | Attending: Neurology | Admitting: Neurology

## 2023-02-09 ENCOUNTER — Emergency Department (HOSPITAL_COMMUNITY): Payer: Medicare Other

## 2023-02-09 ENCOUNTER — Inpatient Hospital Stay (HOSPITAL_COMMUNITY): Payer: Medicare Other

## 2023-02-09 DIAGNOSIS — Z8249 Family history of ischemic heart disease and other diseases of the circulatory system: Secondary | ICD-10-CM | POA: Diagnosis not present

## 2023-02-09 DIAGNOSIS — G4733 Obstructive sleep apnea (adult) (pediatric): Secondary | ICD-10-CM | POA: Diagnosis not present

## 2023-02-09 DIAGNOSIS — I639 Cerebral infarction, unspecified: Secondary | ICD-10-CM | POA: Diagnosis not present

## 2023-02-09 DIAGNOSIS — K219 Gastro-esophageal reflux disease without esophagitis: Secondary | ICD-10-CM | POA: Diagnosis not present

## 2023-02-09 DIAGNOSIS — I4891 Unspecified atrial fibrillation: Secondary | ICD-10-CM | POA: Diagnosis not present

## 2023-02-09 DIAGNOSIS — Z833 Family history of diabetes mellitus: Secondary | ICD-10-CM

## 2023-02-09 DIAGNOSIS — T45615A Adverse effect of thrombolytic drugs, initial encounter: Secondary | ICD-10-CM | POA: Diagnosis not present

## 2023-02-09 DIAGNOSIS — Z952 Presence of prosthetic heart valve: Secondary | ICD-10-CM | POA: Diagnosis not present

## 2023-02-09 DIAGNOSIS — R29818 Other symptoms and signs involving the nervous system: Secondary | ICD-10-CM | POA: Diagnosis not present

## 2023-02-09 DIAGNOSIS — Z83438 Family history of other disorder of lipoprotein metabolism and other lipidemia: Secondary | ICD-10-CM

## 2023-02-09 DIAGNOSIS — E7800 Pure hypercholesterolemia, unspecified: Secondary | ICD-10-CM | POA: Diagnosis not present

## 2023-02-09 DIAGNOSIS — I611 Nontraumatic intracerebral hemorrhage in hemisphere, cortical: Secondary | ICD-10-CM | POA: Diagnosis not present

## 2023-02-09 DIAGNOSIS — E669 Obesity, unspecified: Secondary | ICD-10-CM | POA: Diagnosis present

## 2023-02-09 DIAGNOSIS — R29705 NIHSS score 5: Secondary | ICD-10-CM | POA: Diagnosis present

## 2023-02-09 DIAGNOSIS — Z743 Need for continuous supervision: Secondary | ICD-10-CM | POA: Diagnosis not present

## 2023-02-09 DIAGNOSIS — R6889 Other general symptoms and signs: Secondary | ICD-10-CM | POA: Diagnosis not present

## 2023-02-09 DIAGNOSIS — R4701 Aphasia: Principal | ICD-10-CM | POA: Diagnosis present

## 2023-02-09 DIAGNOSIS — Z6829 Body mass index (BMI) 29.0-29.9, adult: Secondary | ICD-10-CM

## 2023-02-09 DIAGNOSIS — R930 Abnormal findings on diagnostic imaging of skull and head, not elsewhere classified: Secondary | ICD-10-CM | POA: Diagnosis not present

## 2023-02-09 DIAGNOSIS — Z79899 Other long term (current) drug therapy: Secondary | ICD-10-CM | POA: Diagnosis not present

## 2023-02-09 DIAGNOSIS — Z7982 Long term (current) use of aspirin: Secondary | ICD-10-CM | POA: Diagnosis not present

## 2023-02-09 DIAGNOSIS — I10 Essential (primary) hypertension: Secondary | ICD-10-CM | POA: Diagnosis not present

## 2023-02-09 DIAGNOSIS — I63522 Cerebral infarction due to unspecified occlusion or stenosis of left anterior cerebral artery: Secondary | ICD-10-CM | POA: Diagnosis not present

## 2023-02-09 DIAGNOSIS — I2510 Atherosclerotic heart disease of native coronary artery without angina pectoris: Secondary | ICD-10-CM | POA: Diagnosis not present

## 2023-02-09 DIAGNOSIS — I6389 Other cerebral infarction: Secondary | ICD-10-CM | POA: Diagnosis not present

## 2023-02-09 DIAGNOSIS — R569 Unspecified convulsions: Secondary | ICD-10-CM | POA: Diagnosis not present

## 2023-02-09 DIAGNOSIS — I619 Nontraumatic intracerebral hemorrhage, unspecified: Secondary | ICD-10-CM | POA: Diagnosis not present

## 2023-02-09 DIAGNOSIS — Z823 Family history of stroke: Secondary | ICD-10-CM

## 2023-02-09 LAB — URINALYSIS, ROUTINE W REFLEX MICROSCOPIC
Bilirubin Urine: NEGATIVE
Glucose, UA: NEGATIVE mg/dL
Hgb urine dipstick: NEGATIVE
Ketones, ur: NEGATIVE mg/dL
Leukocytes,Ua: NEGATIVE
Nitrite: NEGATIVE
Protein, ur: NEGATIVE mg/dL
Specific Gravity, Urine: 1.021 (ref 1.005–1.030)
pH: 6 (ref 5.0–8.0)

## 2023-02-09 LAB — COMPREHENSIVE METABOLIC PANEL
ALT: 25 U/L (ref 0–44)
AST: 30 U/L (ref 15–41)
Albumin: 3.9 g/dL (ref 3.5–5.0)
Alkaline Phosphatase: 82 U/L (ref 38–126)
Anion gap: 11 (ref 5–15)
BUN: 18 mg/dL (ref 8–23)
CO2: 26 mmol/L (ref 22–32)
Calcium: 9.2 mg/dL (ref 8.9–10.3)
Chloride: 101 mmol/L (ref 98–111)
Creatinine, Ser: 1.45 mg/dL — ABNORMAL HIGH (ref 0.61–1.24)
GFR, Estimated: 50 mL/min — ABNORMAL LOW (ref >60)
Glucose, Bld: 155 mg/dL — ABNORMAL HIGH (ref 70–99)
Potassium: 4.3 mmol/L (ref 3.5–5.1)
Sodium: 138 mmol/L (ref 135–145)
Total Bilirubin: 0.5 mg/dL (ref 0.3–1.2)
Total Protein: 7 g/dL (ref 6.5–8.1)

## 2023-02-09 LAB — DIFFERENTIAL
Abs Immature Granulocytes: 0.03 10*3/uL (ref 0.00–0.07)
Basophils Absolute: 0.1 10*3/uL (ref 0.0–0.1)
Basophils Relative: 1 %
Eosinophils Absolute: 0.5 10*3/uL (ref 0.0–0.5)
Eosinophils Relative: 6 %
Immature Granulocytes: 0 %
Lymphocytes Relative: 29 %
Lymphs Abs: 2.6 10*3/uL (ref 0.7–4.0)
Monocytes Absolute: 0.7 10*3/uL (ref 0.1–1.0)
Monocytes Relative: 8 %
Neutro Abs: 5 10*3/uL (ref 1.7–7.7)
Neutrophils Relative %: 56 %

## 2023-02-09 LAB — CBC
HCT: 43.2 % (ref 39.0–52.0)
Hemoglobin: 14.3 g/dL (ref 13.0–17.0)
MCH: 32.6 pg (ref 26.0–34.0)
MCHC: 33.1 g/dL (ref 30.0–36.0)
MCV: 98.6 fL (ref 80.0–100.0)
Platelets: 244 10*3/uL (ref 150–400)
RBC: 4.38 MIL/uL (ref 4.22–5.81)
RDW: 12.7 % (ref 11.5–15.5)
WBC: 8.9 10*3/uL (ref 4.0–10.5)
nRBC: 0 % (ref 0.0–0.2)

## 2023-02-09 LAB — I-STAT CHEM 8, ED
BUN: 22 mg/dL (ref 8–23)
Calcium, Ion: 0.8 mmol/L — CL (ref 1.15–1.40)
Chloride: 107 mmol/L (ref 98–111)
Creatinine, Ser: 1.6 mg/dL — ABNORMAL HIGH (ref 0.61–1.24)
Glucose, Bld: 152 mg/dL — ABNORMAL HIGH (ref 70–99)
HCT: 42 % (ref 39.0–52.0)
Hemoglobin: 14.3 g/dL (ref 13.0–17.0)
Potassium: 4.3 mmol/L (ref 3.5–5.1)
Sodium: 136 mmol/L (ref 135–145)
TCO2: 21 mmol/L — ABNORMAL LOW (ref 22–32)

## 2023-02-09 LAB — RAPID URINE DRUG SCREEN, HOSP PERFORMED
Amphetamines: NOT DETECTED
Barbiturates: NOT DETECTED
Benzodiazepines: NOT DETECTED
Cocaine: NOT DETECTED
Opiates: NOT DETECTED
Tetrahydrocannabinol: NOT DETECTED

## 2023-02-09 LAB — ETHANOL: Alcohol, Ethyl (B): <10 mg/dL (ref <10)

## 2023-02-09 LAB — APTT: aPTT: 34 s (ref 24–36)

## 2023-02-09 LAB — CBG MONITORING, ED: Glucose-Capillary: 165 mg/dL — ABNORMAL HIGH (ref 70–99)

## 2023-02-09 LAB — HEMOGLOBIN A1C
Hgb A1c MFr Bld: 6 % — ABNORMAL HIGH (ref 4.8–5.6)
Mean Plasma Glucose: 125.5 mg/dL

## 2023-02-09 LAB — PROTIME-INR
INR: 1 (ref 0.8–1.2)
Prothrombin Time: 13.6 s (ref 11.4–15.2)

## 2023-02-09 MED ORDER — PANTOPRAZOLE SODIUM 40 MG IV SOLR
40.0000 mg | Freq: Every day | INTRAVENOUS | Status: DC
  Administered 2023-02-10 – 2023-02-11 (×2): 40 mg via INTRAVENOUS
  Filled 2023-02-09 (×2): qty 10

## 2023-02-09 MED ORDER — ACETAMINOPHEN 325 MG PO TABS: 650.0000 mg | ORAL_TABLET | ORAL | Status: DC | PRN

## 2023-02-09 MED ORDER — ACETAMINOPHEN 160 MG/5ML PO SOLN: 650.0000 mg | ORAL | Status: DC | PRN

## 2023-02-09 MED ORDER — TENECTEPLASE FOR STROKE
0.2500 mg/kg | PACK | Freq: Once | INTRAVENOUS | Status: AC
  Administered 2023-02-09: 25 mg via INTRAVENOUS
  Filled 2023-02-09: qty 10

## 2023-02-09 MED ORDER — STROKE: EARLY STAGES OF RECOVERY BOOK
Freq: Once | Status: AC
  Filled 2023-02-09: qty 1

## 2023-02-09 MED ORDER — IOHEXOL 350 MG/ML SOLN
75.0000 mL | Freq: Once | INTRAVENOUS | Status: AC | PRN
  Administered 2023-02-09: 75 mL via INTRAVENOUS

## 2023-02-09 MED ORDER — LEVETIRACETAM IN NACL 1000 MG/100ML IV SOLN
2000.0000 mg | Freq: Once | INTRAVENOUS | Status: AC
  Administered 2023-02-09: 2000 mg via INTRAVENOUS
  Filled 2023-02-09: qty 200

## 2023-02-09 MED ORDER — SENNOSIDES-DOCUSATE SODIUM 8.6-50 MG PO TABS: 1.0000 | ORAL_TABLET | Freq: Every evening | ORAL | Status: DC | PRN

## 2023-02-09 MED ORDER — ACETAMINOPHEN 650 MG RE SUPP: 650.0000 mg | RECTAL | Status: DC | PRN

## 2023-02-09 NOTE — Progress Notes (Signed)
PHARMACIST CODE STROKE RESPONSE  Notified to mix TNK at 1924 by Dr. Derry Lory TNK preparation completed at 1926 Given at 1928  TNK dose = 25 mg IV over 5 seconds  Issues/delays encountered (if applicable): Needed to confirm BP and Glucose prior to administration  Delmar Landau, PharmD, BCPS 02/09/2023 7:33 PM ED Clinical Pharmacist -  [phone]

## 2023-02-09 NOTE — H&P (Signed)
NEUROLOGY H&P NOTE   Date of service: February 09, 2023 Patient Name: Zachary [name] MRN:  [id number] DOB:  04/07/1946 Chief Complaint: "aphasia"  History of Present Illness  BAZE BEEVERS is a 77 y.o. male GERD, aortic valve stenosis s/p AVR, CAD, HTN, HLD who presents with aphasia. He was fine at 1500. Wife left and returned at 1830 to find him mostly mute, unable to comprehend speech, struggling with speech. She called EMS and he was brought in as a stroke code.  Upon arrival in the ED, has dense receptive and expressive aphasia. Unable to provide any history.  Of note, noted to have some chewing movements of his mouth that at times looked rhythmic. However, these would stop with him performing any tasks.   Last known well: 1500 Modified rankin score: 0-Completely asymptomatic and back to baseline post- stroke tNKASE: Discussed mode of administration, risks, benefits and alternatives with wife over phone. I discussed the time sensitive nature of administration along with risks and benefits. Wife consented to tnkase administration. Thrombectomy: not offered, no LVO NIHSS components Score: Comment  1a Level of Conscious 0[x]  1[]  2[]  3[]      1b LOC Questions 0[]  1[]  2[x]       1c LOC Commands 0[]  1[]  2[x]       2 Best Gaze 0[x]  1[]  2[]       3 Visual 0[x]  1[]  2[]  3[]      4 Facial Palsy 0[x]  1[]  2[]  3[]      5a Motor Arm - left 0[x]  1[]  2[]  3[]  4[]  UN[]    5b Motor Arm - Right 0[x]  1[]  2[]  3[]  4[]  UN[]    6a Motor Leg - Left 0[x]  1[]  2[]  3[]  4[]  UN[]    6b Motor Leg - Right 0[x]  1[]  2[]  3[]  4[]  UN[]    7 Limb Ataxia 0[x]  1[]  2[]  3[]  UN[]     8 Sensory 0[x]  1[]  2[]  UN[]      9 Best Language 0[]  1[]  2[]  3[x]      10 Dysarthria 0[x]  1[]  2[]  UN[]      11 Extinct. and Inattention 0[x]  1[]  2[]       TOTAL: 7      ROS   Unable to obtain review of system secondary to aphasia.  Past History   Past Medical History:  Diagnosis Date   Aortic valvar stenosis    Arthritis    Back pain    Erectile  dysfunction    GERD (gastroesophageal reflux disease)    HTN (hypertension)    Hypercholesteremia    Urinary frequency    Past Surgical History:  Procedure Laterality Date   AORTIC VALVE REPLACEMENT N/A 03/25/2015   Procedure: AORTIC VALVE REPLACEMENT (AVR);  Surgeon: Alleen Borne, MD;  Location: [facility] OR;  Service: Open Heart Surgery;  Laterality: N/A;   CARDIAC CATHETERIZATION N/A 03/10/2015   Procedure: Right/Left Heart Cath and Coronary Angiography;  Surgeon: Lyn Records, MD;  Location: [facility] INVASIVE CV LAB;  Service: Cardiovascular;  Laterality: N/A;   CARDIAC CATHETERIZATION N/A 03/10/2015   Procedure: Intravascular Pressure Wire/FFR Study;  Surgeon: Lyn Records, MD;  Location: [facility] INVASIVE CV LAB;  Service: Cardiovascular;  Laterality: N/A;   LUMBAR DISC SURGERY     TEE WITHOUT CARDIOVERSION N/A 03/25/2015   Procedure: TRANSESOPHAGEAL ECHOCARDIOGRAM (TEE);  Surgeon: Alleen Borne, MD;  Location: [facility] OR;  Service: Open Heart Surgery;  Laterality: N/A;   TONSILLECTOMY     Family History  Problem Relation Age of Onset   Heart disease Mother    Diabetes Mother  Heart attack Mother    Hypertension Mother    Stroke Mother    Cancer Father    Healthy Sister    Hyperlipidemia Brother    Healthy Sister    Healthy Sister    Social History   Socioeconomic History   Marital status: Married    Spouse name: Not on file   Number of children: Not on file   Years of education: Not on file   Highest education level: Not on file  Occupational History   Not on file  Tobacco Use   Smoking status: Never   Smokeless tobacco: Never  Vaping Use   Vaping status: Never Used  Substance and Sexual Activity   Alcohol use: No    Alcohol/week: 0.0 standard drinks of alcohol   Drug use: No   Sexual activity: Not on file  Other Topics Concern   Not on file  Social History Narrative   Not on file   Social Determinants of Health   Financial Resource Strain: Not on file  Food  Insecurity: Not on file  Transportation Needs: Not on file  Physical Activity: Not on file  Stress: Not on file  Social Connections: Not on file   No Known Allergies  Medications  (Not in a hospital admission)    Vitals   Vitals:   02/09/23 1923 02/09/23 1953 02/09/23 1956  BP:  (!) 149/58   Pulse:   (!) 59  Resp:   13  SpO2:   94%  Weight: 99 kg       Body mass index is 30.44 kg/m.  Physical Exam   Constitutional: Appears well-developed and well-nourished.  Psych: Affect appropriate to situation.  Eyes: No scleral injection.  HENT: No OP obstruction.  Head: Normocephalic.  Cardiovascular: Normal rate and regular rhythm.  Respiratory: Effort normal, non-labored breathing.  GI: Soft.  No distension. There is no tenderness.  Skin: WDI.   Neurologic Examination  Mental status/Cognition: Alert, oriented to self only and does not answer any other orientation questions Speech/language: oriented to self, mute. Has trouble with comprehension and following 1 step commands. Cranial nerves:   CN II Pupils equal and reactive to light, blinks to threat BL   CN III,IV,VI EOM intact, no gaze preference or deviation, no nystagmus    CN V normal sensation in V1, V2, and V3 segments bilaterally    CN VII no asymmetry, no nasolabial fold flattening    CN VIII normal hearing to speech    CN IX & X normal palatal elevation, no uvular deviation    CN XI 5/5 head turn and 5/5 shoulder shrug bilaterally    CN XII midline tongue protrusion    Motor:  Muscle bulk: normal, tone normal, pronator drift none tremor none Mvmt Root Nerve  Muscle Right Left Comments  SA C5/6 Ax Deltoid 5 5   EF C5/6 Mc Biceps 5 5   EE C6/7/8 Rad Triceps 5 5   WF C6/7 Med FCR     WE C7/8 PIN ECU     F Ab C8/T1 U ADM/FDI 5 5   HF L1/2/3 Fem Illopsoas 5 5   KE L2/3/4 Fem Quad 5 5   DF L4/5 D Peron Tib Ant 5 5   PF S1/2 Tibial Grc/Sol 5 5    Sensation:  Light touch Intact throughout   Pin prick     Temperature    Vibration   Proprioception    Coordination/Complex Motor:  - Finger to Nose intact  BL - Heel to shin unable to get him to understand how to do - Rapid alternating movement are intact BL - Gait: deferred.   Labs   CBC:  Recent Labs  Lab 02/09/23 1922 02/09/23 1930  WBC 8.9  --   NEUTROABS 5.0  --   HGB 14.3 14.3  HCT 43.2 42.0  MCV 98.6  --   PLT 244  --     Basic Metabolic Panel:  Lab Results  Component Value Date   NA 136 02/09/2023   K 4.3 02/09/2023   CO2 26 02/09/2023   GLUCOSE 152 (H) 02/09/2023   BUN 22 02/09/2023   CREATININE 1.60 (H) 02/09/2023   CALCIUM 9.2 02/09/2023   GFRNONAA 50 (L) 02/09/2023   GFRAA >60 03/28/2015   Lipid Panel: No results found for: "LDLCALC" HgbA1c:  Lab Results  Component Value Date   HGBA1C 6.2 (H) 03/24/2015   Urine Drug Screen: No results found for: "LABOPIA", "COCAINSCRNUR", "LABBENZ", "AMPHETMU", "THCU", "LABBARB"  Alcohol Level     Component Value Date/Time   ETH <10 02/09/2023 1922   INR  Lab Results  Component Value Date   INR 1.0 02/09/2023   APTT  Lab Results  Component Value Date   APTT 34 02/09/2023     CT Head without contrast(Personally reviewed): CTH was negative for a large hypodensity concerning for a large territory infarct or hyperdensity concerning for an ICH  CT angio Head and Neck with contrast(Personally reviewed): No LVO.  MRI Brain(Personally reviewed): Pending  Impression   DEVINE SCURTO is a 77 y.o. male with GERD, aortic valve stenosis s/p AVR, CAD, HTN, HLD who presents with aphasia and NIHSS of 7. Given tnkase. Not a candidate for thrombectomy. Also noted to have rhythmic chewing movements of his mouth.  Highest suspicion for potential stroke.  Recommendations  Acute left MCA stroke s/p tnkase: - Frequent NeuroChecks for post tNKase care per stroke unit protocol: - Initial CTH demonstrated no acute hemorrhage or mass - MRI Brain - pending - CTA - no LVO -  TTE - pending - Lipid Panel: LDL - pending.  - Statin: if LDL > 70 - HbA1c: pending. - Antithrombotic: Start ASA 81 mg daily if 24 h CTH does not show acute hemorrhage - DVT prophylaxis: SCDs. Pharmacologic prophylaxis if 24 h CTH does not demonstrate acute hemorrhage - Systolic Blood Pressure goal: < 180 mm Hg - Telemetry monitoring for arrhythmia: 72 hours - Swallow screen - ordered - PT/OT/SLP consults  GERD: - PPI while inpatient.  HLD: - cont home atorvastatin.  Code status discussed with wife and patient is full code. Also discussed allergies and wife mentions that he has issues with bleeding with full dose aspirin and ibuprofen but no true allergies that she is aware of.  This patient is critically ill and at significant risk of neurological worsening, death and care requires constant monitoring of vital signs, hemodynamics,respiratory and cardiac monitoring, neurological assessment, discussion with family, other specialists and medical decision making of high complexity. I spent 45 minutes of neurocritical care time  in the care of  this patient. This was time spent independent of any time provided by nurse practitioner or PA.  [name] [facility] 02/10/2023  1:43 AM ______________________________________________________________________   Welton Flakes

## 2023-02-09 NOTE — Code Documentation (Signed)
Stroke Response Nurse Documentation Code Documentation  Zachary [name] is a 77 y.o. male arriving to [facility]  via [facility] on 10/18 with past medical hx of HTN, HLD, Gerd. On aspirin 325 mg daily. Code stroke was activated by EMS.   Patient from home where he was LKW at 1500 and now complaining of aphasia .  Stroke team at the bedside on patient arrival. Labs drawn and patient cleared for CT by Dr. Anitra Lauth. Patient to CT with team. NIHSS 9, see documentation for details and code stroke times. Patient with disoriented, not following commands, Global aphasia , and dysarthria  on exam. The following imaging was completed:  CT Head and CTA. Patient is a candidate for IV Thrombolytic due to fixed neurological deficit. Patient is not a candidate for IR due to No LVO.   Care Plan: TNKase.   Bedside handoff with ED RN Pennie Rushing.    Rose Fillers  Rapid Response RN

## 2023-02-09 NOTE — ED Provider Notes (Signed)
[place] EMERGENCY DEPARTMENT AT [facility] Provider Note   CSN: [id number] Arrival date & time: 02/09/23  1919     History  Chief Complaint  Patient presents with   Code Stroke    ELO ROULAND is a 77 y.o. male.  Patient is a 77 year old male with history of hypertension, hyperlipidemia, aortic valve stenosis and GERD who is presenting today as a code stroke.  Patient arrived at 75 and was seen immediately by an emergency medicine provider and neurology.  Patient was last seen normal at 3 PM and family noticed tonight when they came back that patient was not speaking well seemed confused and was saying the wrong words.  Patient currently is still having difficulty getting his words out but denies any headache, chest pain, shortness of breath.  He only takes an aspirin but no other anticoagulation.  He denies any nausea or vomiting or recent medication changes.  He denies any fall or trauma.  The history is provided by the EMS personnel, the patient and medical records.       Home Medications Prior to Admission medications   Medication Sig Start Date End Date Taking? Authorizing Provider  acetaminophen (TYLENOL) 325 MG tablet Take 650 mg by mouth every 6 (six) hours as needed for mild pain.    [provider]  amoxicillin (AMOXIL) 500 MG tablet Take 2,000 mg by mouth as needed (Take one (1) hour prior to dental procedures.).    [provider]  aspirin 81 MG EC tablet Take 1 tablet (81 mg total) by mouth daily. 04/15/15   Janetta Hora, PA-C  atorvastatin (LIPITOR) 40 MG tablet Take 40 mg by mouth daily.    [provider]  metoprolol succinate (TOPROL-XL) 25 MG 24 hr tablet Take 1 tablet (25 mg total) by mouth daily. 06/03/21   Lyn Records, MD      Allergies    Patient has no known allergies.    Review of Systems   Review of Systems  Physical Exam Updated Vital Signs BP (!) 149/58   Pulse (!) 59   Resp 13   Wt 99 kg    SpO2 94%   BMI 30.44 kg/m  Physical Exam Vitals and nursing note reviewed.  Constitutional:      General: He is not in acute distress.    Appearance: He is well-developed.  HENT:     Head: Normocephalic and atraumatic.  Eyes:     General: No visual field deficit.    Conjunctiva/sclera: Conjunctivae normal.     Pupils: Pupils are equal, round, and reactive to light.  Cardiovascular:     Rate and Rhythm: Normal rate and regular rhythm.     Heart sounds: No murmur heard. Pulmonary:     Effort: Pulmonary effort is normal. No respiratory distress.     Breath sounds: Normal breath sounds. No wheezing or rales.  Abdominal:     General: There is no distension.     Palpations: Abdomen is soft.     Tenderness: There is no abdominal tenderness. There is no guarding or rebound.  Musculoskeletal:        General: No tenderness. Normal range of motion.     Cervical back: Normal range of motion and neck supple.  Skin:    General: Skin is warm and dry.     Findings: No erythema or rash.  Neurological:     Mental Status: He is alert and oriented to person, place, and time.  Cranial Nerves: No facial asymmetry.     Sensory: Sensation is intact.     Motor: Motor function is intact.     Coordination: Coordination normal. Heel to Shin Test normal.     Comments: Difficulty naming items, appears to have expressive aphasia.  Patient's mouth is twitching but seem to stop with effort  Psychiatric:        Behavior: Behavior normal.     ED Results / Procedures / Treatments   Labs (all labs ordered are listed, but only abnormal results are displayed) Labs Reviewed  COMPREHENSIVE METABOLIC PANEL - Abnormal; Notable for the following components:      Result Value   Glucose, Bld 155 (*)    Creatinine, Ser 1.45 (*)    GFR, Estimated 50 (*)    All other components within normal limits  I-STAT CHEM 8, ED - Abnormal; Notable for the following components:   Creatinine, Ser 1.60 (*)    Glucose, Bld  152 (*)    Calcium, Ion 0.80 (*)    TCO2 21 (*)    All other components within normal limits  CBG MONITORING, ED - Abnormal; Notable for the following components:   Glucose-Capillary 165 (*)    All other components within normal limits  ETHANOL  PROTIME-INR  APTT  CBC  DIFFERENTIAL  RAPID URINE DRUG SCREEN, HOSP PERFORMED  URINALYSIS, ROUTINE W REFLEX MICROSCOPIC  LIPID PANEL  HEMOGLOBIN A1C    EKG EKG Interpretation Date/Time:  Friday February 09 2023 19:55:05 EDT Ventricular Rate:  81 PR Interval:  155 QRS Duration:  102 QT Interval:  377 QTC Calculation: 438 R Axis:   36  Text Interpretation: Sinus rhythm Low voltage, precordial leads Consider inferior infarct No significant change since last tracing Confirmed by Gwyneth Sprout ([id number]) on 02/09/2023 7:57:12 PM  Radiology CT ANGIO HEAD NECK W WO CM (CODE STROKE)  Result Date: 02/09/2023 CLINICAL DATA:  Global aphasia EXAM: CT ANGIOGRAPHY HEAD AND NECK WITH AND WITHOUT CONTRAST TECHNIQUE: Multidetector CT imaging of the head and neck was performed using the standard protocol during bolus administration of intravenous contrast. Multiplanar CT image reconstructions and MIPs were obtained to evaluate the vascular anatomy. Carotid stenosis measurements (when applicable) are obtained utilizing NASCET criteria, using the distal internal carotid diameter as the denominator. RADIATION DOSE REDUCTION: This exam was performed according to the departmental dose-optimization program which includes automated exposure control, adjustment of the mA and/or kV according to patient size and/or use of iterative reconstruction technique. CONTRAST:  75 mL Omnipaque 350 COMPARISON:  No prior CTA available, correlation is made with CT head 02/09/2023 FINDINGS: CT HEAD FINDINGS For noncontrast findings, please see same day CT head. CTA NECK FINDINGS Aortic arch: Standard branching. Imaged portion shows no evidence of aneurysm or dissection. No  significant stenosis of the major arch vessel origins. Right carotid system: No evidence of dissection, occlusion, or hemodynamically significant stenosis (greater than 50%). Left carotid system: No evidence of dissection, occlusion, or hemodynamically significant stenosis (greater than 50%). Vertebral arteries: No evidence of dissection, occlusion, or hemodynamically significant stenosis (greater than 50%). Skeleton: No acute osseous abnormality. Degenerative changes in the cervical spine. Other neck: No acute finding. Upper chest: No focal pulmonary opacity or pleural effusion. Review of the MIP images confirms the above findings CTA HEAD FINDINGS Anterior circulation: Both internal carotid arteries are patent to the termini, without significant stenosis. A1 segments patent. Normal anterior communicating artery. Anterior cerebral arteries are patent to their distal aspects without significant stenosis. No  M1 stenosis or occlusion. MCA branches perfused to their distal aspects without significant stenosis. Posterior circulation: Vertebral arteries patent to the vertebrobasilar junction without significant stenosis. Posterior inferior cerebellar arteries patent proximally. Basilar patent to its distal aspect without significant stenosis. Superior cerebellar arteries patent proximally. Patent right P1. Fetal origin of the left PCA from the left posterior communicating artery. The right posterior communicating artery is also patent. PCAs perfused to their distal aspects without significant stenosis. Venous sinuses: As permitted by contrast timing, patent. Anatomic variants: Fetal origin of the left PCA. No evidence of aneurysm or vascular malformation. Review of the MIP images confirms the above findings IMPRESSION: 1. No intracranial large vessel occlusion or significant stenosis. 2. No hemodynamically significant stenosis in the neck. Electronically Signed   By: Wiliam Ke M.D.   On: 02/09/2023 19:46   CT HEAD  CODE STROKE WO CONTRAST  Result Date: 02/09/2023 CLINICAL DATA:  Code stroke.  Aphasia EXAM: CT HEAD WITHOUT CONTRAST TECHNIQUE: Contiguous axial images were obtained from the base of the skull through the vertex without intravenous contrast. RADIATION DOSE REDUCTION: This exam was performed according to the departmental dose-optimization program which includes automated exposure control, adjustment of the mA and/or kV according to patient size and/or use of iterative reconstruction technique. COMPARISON:  None Available. FINDINGS: Brain: No evidence of acute infarction, hemorrhage, mass, mass effect, or midline shift. No hydrocephalus or extra-axial collection. Periventricular white matter changes, likely the sequela of chronic small vessel ischemic disease. Vascular: No hyperdense vessel. Skull: Negative for fracture or focal lesion. Sinuses/Orbits: Mild mucosal thickening in the left maxillary sinus. No acute finding in the orbits. Status post bilateral lens replacements. Other: The mastoid air cells are well aerated. ASPECTS [facility] Stroke Program Early CT Score) - Ganglionic level infarction (caudate, lentiform nuclei, internal capsule, insula, M1-M3 cortex): 7 - Supraganglionic infarction (M4-M6 cortex): 3 Total score (0-10 with 10 being normal): 10 IMPRESSION: No evidence of acute intracranial process. ASPECTS is 10. Imaging results were communicated on 02/09/2023 at 7:33 pm to provider Dr. Derry Lory via secure text paging. Electronically Signed   By: Wiliam Ke M.D.   On: 02/09/2023 19:36    Procedures Procedures    Medications Ordered in ED Medications   stroke: early stages of recovery book (has no administration in time range)  acetaminophen (TYLENOL) tablet 650 mg (has no administration in time range)    Or  acetaminophen (TYLENOL) 160 MG/5ML solution 650 mg (has no administration in time range)    Or  acetaminophen (TYLENOL) suppository 650 mg (has no administration in time range)   senna-docusate (Senokot-S) tablet 1 tablet (has no administration in time range)  pantoprazole (PROTONIX) injection 40 mg (has no administration in time range)  tenecteplase (TNKASE) injection for Stroke 25 mg (25 mg Intravenous Given 02/09/23 1928)  iohexol (OMNIPAQUE) 350 MG/ML injection 75 mL (75 mLs Intravenous Contrast Given 02/09/23 1943)    ED Course/ Medical Decision Making/ A&P                                 Medical Decision Making Amount and/or Complexity of Data Reviewed Independent Historian: EMS Labs: ordered. Decision-making details documented in ED Course. Radiology: ordered and independent interpretation performed. Decision-making details documented in ED Course. ECG/medicine tests: ordered and independent interpretation performed. Decision-making details documented in ED Course.  Risk Decision regarding hospitalization.   Pt with multiple medical problems and comorbidities and presenting today with  a complaint that caries a high risk for morbidity and mortality.  Here today as a code stroke with last known normal of 3 PM which was approximately 4 hours prior to arrival.  Patient went immediately to CT scan.  Blood sugar here was 165.  Given patient's aphasia without other significant complaints biggest concern is for acute stroke.  I have independently visualized and interpreted pt's images today.  CT is negative for acute bleed.  I independently interpreted patient's EKG and labs.  I-STAT Chem-8 showed a creatinine of 1.6 with normal electrolytes, EtOH, INR, PTT, CBC were all within normal limits.  EKG without acute findings. Patient received TNKase while in the scanner as he was close to outside of the window.  Blood pressures were within the range that would allow for TNKase.  Neurology present throughout.  NIH of 7 initially and now is 9. CTA of the head and neck without significant stenosis or occlusion per radiology.  Patient will require admission.  Will be admitted  to the neuro ICU for frequent checks. CRITICAL CARE Performed by: Detrick Dani Total critical care time: 40 minutes Critical care time was exclusive of separately billable procedures and treating other patients. Critical care was necessary to treat or prevent imminent or life-threatening deterioration. Critical care was time spent personally by me on the following activities: development of treatment plan with patient and/or surrogate as well as nursing, discussions with consultants, evaluation of patient's response to treatment, examination of patient, obtaining history from patient or surrogate, ordering and performing treatments and interventions, ordering and review of laboratory studies, ordering and review of radiographic studies, pulse oximetry and re-evaluation of patient's condition.          Final Clinical Impression(s) / ED Diagnoses Final diagnoses:  Aphasia  Acute ischemic stroke [facility])    Rx / DC Orders ED Discharge Orders     None         Gwyneth Sprout, MD 02/09/23 1958

## 2023-02-09 NOTE — ED Triage Notes (Signed)
Py is coming in with a last know well of 1500, wife noticed he was not talking and unable to make sense of what he was saying.

## 2023-02-10 ENCOUNTER — Inpatient Hospital Stay (HOSPITAL_COMMUNITY): Payer: Medicare Other

## 2023-02-10 DIAGNOSIS — R4701 Aphasia: Secondary | ICD-10-CM

## 2023-02-10 DIAGNOSIS — R569 Unspecified convulsions: Secondary | ICD-10-CM | POA: Diagnosis not present

## 2023-02-10 LAB — LIPID PANEL
Cholesterol: 115 mg/dL (ref 0–200)
HDL: 34 mg/dL — ABNORMAL LOW (ref >40)
LDL Cholesterol: 62 mg/dL (ref 0–99)
Total CHOL/HDL Ratio: 3.4 {ratio}
Triglycerides: 95 mg/dL (ref <150)
VLDL: 19 mg/dL (ref 0–40)

## 2023-02-10 LAB — MRSA NEXT GEN BY PCR, NASAL: MRSA by PCR Next Gen: NOT DETECTED

## 2023-02-10 LAB — MAGNESIUM: Magnesium: 2.3 mg/dL (ref 1.7–2.4)

## 2023-02-10 MED ORDER — ORAL CARE MOUTH RINSE: 15.0000 mL | OROMUCOSAL | Status: DC | PRN

## 2023-02-10 MED ORDER — LABETALOL HCL 5 MG/ML IV SOLN: 10.0000 mg | INTRAVENOUS | Status: DC | PRN

## 2023-02-10 MED ORDER — CHLORHEXIDINE GLUCONATE CLOTH 2 % EX PADS
6.0000 | MEDICATED_PAD | Freq: Every day | CUTANEOUS | Status: DC
  Administered 2023-02-10 – 2023-02-12 (×3): 6 via TOPICAL

## 2023-02-10 MED ORDER — ATORVASTATIN CALCIUM 40 MG PO TABS
40.0000 mg | ORAL_TABLET | Freq: Every day | ORAL | Status: DC
  Administered 2023-02-10 – 2023-02-12 (×3): 40 mg via ORAL
  Filled 2023-02-10 (×2): qty 1

## 2023-02-10 NOTE — Progress Notes (Addendum)
24-hour head CT concerning for small bleed in the left temporal area. Will reduce the blood pressure parameters to systolic blood pressure between 130-150. Also will hold off any antiplatelets or anticoagulants at this time Repeat head CT at 1 AM RN was informed of the plan Labetalol as needed was ordered If labetalol as needed is ineffective or not managing the blood pressure, please call for Cleviprex orders. Stroke team to follow  15 min CC time -- Milon Dikes, MD Neurologist [facility]

## 2023-02-10 NOTE — Evaluation (Signed)
Speech Language Pathology Evaluation Patient Details Name: Zachary [name] MRN: [id number] DOB: July 07, 1945 Today's Date: 02/10/2023 Time: 8657-8469 SLP Time Calculation (min) (ACUTE ONLY): 25 min  Problem List:  Patient Active Problem List   Diagnosis Date Noted   Aphasia 02/09/2023   S/P AVR 03/25/2015   CAD (coronary artery disease), native coronary artery 03/10/2015   Hypertensive heart disease 03/06/2013    Class: Chronic   Hyperlipidemia 03/06/2013    Class: Chronic   GERD (gastroesophageal reflux disease) 03/06/2013   Past Medical History:  Past Medical History:  Diagnosis Date   Aortic valvar stenosis    Arthritis    Back pain    Erectile dysfunction    GERD (gastroesophageal reflux disease)    HTN (hypertension)    Hypercholesteremia    Urinary frequency    Past Surgical History:  Past Surgical History:  Procedure Laterality Date   AORTIC VALVE REPLACEMENT N/A 03/25/2015   Procedure: AORTIC VALVE REPLACEMENT (AVR);  Surgeon: Alleen Borne, MD;  Location: [facility] OR;  Service: Open Heart Surgery;  Laterality: N/A;   CARDIAC CATHETERIZATION N/A 03/10/2015   Procedure: Right/Left Heart Cath and Coronary Angiography;  Surgeon: Lyn Records, MD;  Location: [facility] INVASIVE CV LAB;  Service: Cardiovascular;  Laterality: N/A;   CARDIAC CATHETERIZATION N/A 03/10/2015   Procedure: Intravascular Pressure Wire/FFR Study;  Surgeon: Lyn Records, MD;  Location: [facility] INVASIVE CV LAB;  Service: Cardiovascular;  Laterality: N/A;   LUMBAR DISC SURGERY     TEE WITHOUT CARDIOVERSION N/A 03/25/2015   Procedure: TRANSESOPHAGEAL ECHOCARDIOGRAM (TEE);  Surgeon: Alleen Borne, MD;  Location: [facility] OR;  Service: Open Heart Surgery;  Laterality: N/A;   TONSILLECTOMY     HPI:  Patient is a 77 y.o. male with PMH: is a 77 y.o. male GERD, aortic valve stenosis s/p AVR, CAD, HTN, HLD. He presented to the ED via EMS from home on 02/09/23 as a code stroke after his spouse returned home in the afternoon to  find him mostly mute and unable to comprehend speech or communicate. Initial CT head was negative for acute hemorrhage or mass, MRI brain was suggestive of hyperacute infarct in the left frontal lobe including the operculum.   Assessment / Plan / Recommendation Clinical Impression  Patient presents with a moderate-severe mixed receptive-expressive aphasia with receptive language skills appearing better than expressive. Patient's daughter was in the room during this evaluation and in addition, SLP spoke with her in the hallway outside of patient's room when he was toileting. Patient was slightly better than 50% accurate for answering biographical yes/no questions but frequently would respond "yeah" initially and would require SLP to rephrase/cue. He was able to perform automatic speech task of stating the days of the week and counting when SLP provided initial word cue "one.....", etc. Patient had more difficulty with stating months of the year and when given cue, would name a month but not in order. Initially, patient allowed for hand over hand cues to point to his nose, however he then resisted this and would not allow for SLP to provide any more physical/tactile cues. He would not initiate pointing even with maximal cues. Eye contact with SLP was poor overall and he required moderate amount of cues to attend to SLP or attend to a visual/gestural cue. Patient became noticeably frustrated and attempted to communicate this but unfortunately was not able to do so verbally or non-verbally. His daughter does feel that he is tired and has not slept much since admission.  She did tell SLP that patient was able to feed self without difficulty and RN stated that he ambulated to bathroom with her earlier. SLP will continue to follow patient and continue with aphasia treatment including ongoing diagnostic treatment. PT and OT evaluations are pending but SLP is recommending either inpatient rehab or outpatient neuro rehab  for ongoing SLP intervention at next venue of care.    SLP Assessment  SLP Recommendation/Assessment: Patient needs continued Speech Lanaguage Pathology Services SLP Visit Diagnosis: Aphasia (R47.01)    Recommendations for follow up therapy are one component of a multi-disciplinary discharge planning process, led by the attending physician.  Recommendations may be updated based on patient status, additional functional criteria and insurance authorization.    Follow Up Recommendations  Other (comment) (Inpatient rehab or at OP Neuro)    Assistance Recommended at Discharge  Frequent or constant Supervision/Assistance  Functional Status Assessment Patient has had a recent decline in their functional status and demonstrates the ability to make significant improvements in function in a reasonable and predictable amount of time.  Frequency and Duration min 2x/week  2 weeks      SLP Evaluation Cognition  Overall Cognitive Status: Difficult to assess Arousal/Alertness: Awake/alert Orientation Level: Disoriented X4 Attention: Sustained Sustained Attention: Impaired Sustained Attention Impairment: Verbal basic       Comprehension  Auditory Comprehension Overall Auditory Comprehension: Impaired Yes/No Questions: Impaired Basic Biographical Questions: 26-50% accurate Commands: Impaired One Step Basic Commands: 0-24% accurate EffectiveTechniques: Extra processing time;Stressing words;Slowed speech Visual Recognition/Discrimination Discrimination: Not tested Reading Comprehension Reading Status: Not tested    Expression Expression Primary Mode of Expression: Verbal Verbal Expression Overall Verbal Expression: Impaired Automatic Speech: Counting;Day of week;Month of year Level of Generative/Spontaneous Verbalization: Word;Phrase Repetition: No impairment Naming: Impairment Responsive: 0-25% accurate Confrontation: Impaired Convergent: Not tested Divergent: Not tested Verbal  Errors: Phonemic paraphasias;Echolalia;Perseveration;Jargon Pragmatics: Impairment Impairments: Eye contact Effective Techniques: Phonemic cues   Oral / Motor  Oral Motor/Sensory Function Overall Oral Motor/Sensory Function: Within functional limits Motor Speech Overall Motor Speech: Appears within functional limits for tasks assessed Respiration: Within functional limits Resonance: Within functional limits            Angela Nevin, MA, CCC-SLP Speech Therapy

## 2023-02-10 NOTE — Procedures (Signed)
Patient Name: Zachary [name]  MRN: [id number]  Epilepsy Attending: Charlsie Quest  Referring Physician/Provider: Elmer Picker, NP  Date: 02/10/2023 Duration: 36 mins  Patient history: 77 y.o. male with GERD, aortic valve stenosis s/p AVR, CAD, HTN, HLD who presents with aphasia. EEG to evaluate for seizure  Level of alertness: Awake  AEDs during EEG study: None  Technical aspects: This EEG study was done with scalp electrodes positioned according to the 10-20 International system of electrode placement. Electrical activity was reviewed with band pass filter of 1-70Hz , sensitivity of 7 uV/mm, display speed of 23mm/sec with a 60Hz  notched filter applied as appropriate. EEG data were recorded continuously and digitally stored.  Video monitoring was available and reviewed as appropriate.  Description: The posterior dominant rhythm consists of 7.5Hz  activity of moderate voltage (25-35 uV) seen predominantly in posterior head regions, symmetric and reactive to eye opening and eye closing. EEG showed intermittent 3 to 5 Hz theta-delta slowing in left fronto-temporal region. Sharp waves were noted in left fronto-temporal region. Hyperventilation and photic stimulation were not performed.     ABNORMALITY -Sharp wave,  left fronto-temporal region - Intermittent slow,  left fronto-temporal region  IMPRESSION: This study evidence of potential epileptogenicity and cortical dysfunction arising from left fronto-temporal region. No seizures were seen throughout the recording.  Zachary [name] [name]

## 2023-02-10 NOTE — Progress Notes (Signed)
Brief Neuro Update:  I called patient's wife Ms. Treven Pegan to notify her of the noted small ICH and plan for repeat CT head around 0100 AM.  [name] [facility]

## 2023-02-10 NOTE — Progress Notes (Signed)
PT Cancellation Note  Patient Details Name: Zachary [name] MRN: [id number] DOB: 1945-05-21   Cancelled Treatment:    Reason Eval/Treat Not Completed: Active bedrest order; will follow up when activity orders upgraded.   Elray Mcgregor 02/10/2023, 9:09 AM Sheran Lawless, PT Acute Rehabilitation Services Office:[phone] 02/10/2023

## 2023-02-10 NOTE — Progress Notes (Signed)
EEG complete - results pending 

## 2023-02-10 NOTE — Progress Notes (Addendum)
STROKE TEAM PROGRESS NOTE   BRIEF HPI [name] is a 77 y.o. male with history of GERD, aortic valve stenosis s/p AVR, CAD, HTN, HLD who presents with aphasia. He was fine at 1500. Wife left and returned at 1830 to find him mostly mute, unable to comprehend speech, struggling with speech. She called EMS and he was brought in as a stroke code.   SIGNIFICANT HOSPITAL EVENTS 10/18- received TNK   INTERIM HISTORY/SUBJECTIVE Wife and daughters at the bedside. Still has significant aphasia. Repetition intact, unable to read. Mimics but does not consistently follow commands. MRI already completed, 24 hours CT ordered. EEG negative for seizure.  Patient has had tremors in bilateral hands for several years (approx 5 yrs). Patient also began having repetitive movements in lower jaw approximately two years prior to this admission   OBJECTIVE  CBC    Component Value Date/Time   WBC 8.9 02/09/2023 1922   RBC 4.38 02/09/2023 1922   HGB 14.3 02/09/2023 1930   HCT 42.0 02/09/2023 1930   PLT 244 02/09/2023 1922   MCV 98.6 02/09/2023 1922   MCH 32.6 02/09/2023 1922   MCHC 33.1 02/09/2023 1922   RDW 12.7 02/09/2023 1922   LYMPHSABS 2.6 02/09/2023 1922   MONOABS 0.7 02/09/2023 1922   EOSABS 0.5 02/09/2023 1922   BASOSABS 0.1 02/09/2023 1922    BMET    Component Value Date/Time   NA 136 02/09/2023 1930   K 4.3 02/09/2023 1930   CL 107 02/09/2023 1930   CO2 26 02/09/2023 1922   GLUCOSE 152 (H) 02/09/2023 1930   BUN 22 02/09/2023 1930   CREATININE 1.60 (H) 02/09/2023 1930   CREATININE 1.30 (H) 03/03/2015 1703   CALCIUM 9.2 02/09/2023 1922   GFRNONAA 50 (L) 02/09/2023 1922    IMAGING past 24 hours EEG adult  Result Date: 02/10/2023 Charlsie Quest, MD     02/10/2023  1:42 PM Patient Name: Zachary [name] MRN: [id number] Epilepsy Attending: Charlsie Quest Referring Physician/Provider: Elmer Picker, NP Date: 02/10/2023 Duration: 36 mins Patient history: 77 y.o. male with GERD,  aortic valve stenosis s/p AVR, CAD, HTN, HLD who presents with aphasia. EEG to evaluate for seizure Level of alertness: Awake AEDs during EEG study: None Technical aspects: This EEG study was done with scalp electrodes positioned according to the 10-20 International system of electrode placement. Electrical activity was reviewed with band pass filter of 1-70Hz , sensitivity of 7 uV/mm, display speed of 32mm/sec with a 60Hz  notched filter applied as appropriate. EEG data were recorded continuously and digitally stored.  Video monitoring was available and reviewed as appropriate. Description: The posterior dominant rhythm consists of 7.5Hz  activity of moderate voltage (25-35 uV) seen predominantly in posterior head regions, symmetric and reactive to eye opening and eye closing. EEG showed intermittent 3 to 5 Hz theta-delta slowing in left fronto-temporal region. Sharp waves were noted in left fronto-temporal region. Hyperventilation and photic stimulation were not performed.   ABNORMALITY -Sharp wave,  left fronto-temporal region - Intermittent slow,  left fronto-temporal region IMPRESSION: This study evidence of potential epileptogenicity and cortical dysfunction arising from left fronto-temporal region. No seizures were seen throughout the recording. Charlsie Quest   MR BRAIN WO CONTRAST  Result Date: 02/09/2023 CLINICAL DATA:  Aphasia, stroke suspected EXAM: MRI HEAD WITHOUT CONTRAST TECHNIQUE: Multiplanar, multiecho pulse sequences of the brain and surrounding structures were obtained without intravenous contrast. COMPARISON:  No prior MRI available, correlation is made with 02/09/2023 CT head FINDINGS: Brain:  Cortical restricted diffusion with ADC correlate in the left frontal lobe, including the operculum (series 7, image 61 and series 5, images 81-92). Minimal associated T2 hyperintense signal, suggestive of hyperacute infarct. No acute hemorrhage, mass, mass effect, or midline shift. No hydrocephalus or  extra-axial collection. Normal pituitary and craniocervical junction. 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 Remote lacunar infarcts in the left caudate head and bilateral cerebellar hemispheres. Scattered T2 hyperintense signal in the periventricular white matter, likely the sequela of chronic small vessel ischemic disease. Vascular: Normal arterial flow voids. Skull and upper cervical spine: Normal marrow signal. Sinuses/Orbits: Clear paranasal sinuses. No acute finding in the orbits. Status post bilateral lens replacements. Other: The mastoid air cells are well aerated. Incidental note is made susceptibility artifact that appears centered on the temporal bones, which correlates with metallic foci within the external auditory canals on the same-day CT. IMPRESSION: 1. Cortical restricted diffusion with ADC correlate in the left frontal lobe, including the operculum, with minimal associated T2 hyperintense signal, suggestive of hyperacute infarct. No evidence of hemorrhagic transformation or significant mass effect. 2. Incidental note is made susceptibility artifact that appears centered on the temporal bones, which correlates with metallic foci within the external auditory canals on the same-day CT. Correlate with physical exam. Imaging results were communicated on 02/09/2023 at 9:34 pm to provider Dr. Derry Lory via secure text paging. Electronically Signed   By: Wiliam Ke M.D.   On: 02/09/2023 21:35   CT ANGIO HEAD NECK W WO CM (CODE STROKE)  Result Date: 02/09/2023 CLINICAL DATA:  Global aphasia EXAM: CT ANGIOGRAPHY HEAD AND NECK WITH AND WITHOUT CONTRAST TECHNIQUE: Multidetector CT imaging of the head and neck was performed using the standard protocol during bolus administration of intravenous contrast. Multiplanar CT image reconstructions and MIPs were obtained to evaluate the vascular anatomy.  Carotid stenosis measurements (when applicable) are obtained utilizing NASCET criteria, using the distal internal carotid diameter as the denominator. RADIATION DOSE REDUCTION: This exam was performed according to the departmental dose-optimization program which includes automated exposure control, adjustment of the mA and/or kV according to patient size and/or use of iterative reconstruction technique. CONTRAST:  75 mL Omnipaque 350 COMPARISON:  No prior CTA available, correlation is made with CT head 02/09/2023 FINDINGS: CT HEAD FINDINGS For noncontrast findings, please see same day CT head. CTA NECK FINDINGS Aortic arch: Standard branching. Imaged portion shows no evidence of aneurysm or dissection. No significant stenosis of the major arch vessel origins. Right carotid system: No evidence of dissection, occlusion, or hemodynamically significant stenosis (greater than 50%). Left carotid system: No evidence of dissection, occlusion, or hemodynamically significant stenosis (greater than 50%). Vertebral arteries: No evidence of dissection, occlusion, or hemodynamically significant stenosis (greater than 50%). Skeleton: No acute osseous abnormality. Degenerative changes in the cervical spine. Other neck: No acute finding. Upper chest: No focal pulmonary opacity or pleural effusion. Review of the MIP images confirms the above findings CTA HEAD FINDINGS Anterior circulation: Both internal carotid arteries are patent to the termini, without significant stenosis. A1 segments patent. Normal anterior communicating artery. Anterior cerebral arteries are patent to their distal aspects without significant stenosis. No M1 stenosis or occlusion. MCA branches perfused to their distal aspects without significant stenosis. Posterior circulation: Vertebral arteries patent to the vertebrobasilar junction without significant stenosis. Posterior inferior cerebellar arteries patent proximally. Basilar patent to its distal aspect  without significant stenosis. Superior cerebellar arteries patent proximally. Patent right  P1. Fetal origin of the left PCA from the left posterior communicating artery. The right posterior communicating artery is also patent. PCAs perfused to their distal aspects without significant stenosis. Venous sinuses: As permitted by contrast timing, patent. Anatomic variants: Fetal origin of the left PCA. No evidence of aneurysm or vascular malformation. Review of the MIP images confirms the above findings IMPRESSION: 1. No intracranial large vessel occlusion or significant stenosis. 2. No hemodynamically significant stenosis in the neck. Electronically Signed   By: Wiliam Ke M.D.   On: 02/09/2023 19:46   CT HEAD CODE STROKE WO CONTRAST  Result Date: 02/09/2023 CLINICAL DATA:  Code stroke.  Aphasia EXAM: CT HEAD WITHOUT CONTRAST TECHNIQUE: Contiguous axial images were obtained from the base of the skull through the vertex without intravenous contrast. RADIATION DOSE REDUCTION: This exam was performed according to the departmental dose-optimization program which includes automated exposure control, adjustment of the mA and/or kV according to patient size and/or use of iterative reconstruction technique. COMPARISON:  None Available. FINDINGS: Brain: No evidence of acute infarction, hemorrhage, mass, mass effect, or midline shift. No hydrocephalus or extra-axial collection. Periventricular white matter changes, likely the sequela of chronic small vessel ischemic disease. Vascular: No hyperdense vessel. Skull: Negative for fracture or focal lesion. Sinuses/Orbits: Mild mucosal thickening in the left maxillary sinus. No acute finding in the orbits. Status post bilateral lens replacements. Other: The mastoid air cells are well aerated. ASPECTS [facility] Stroke Program Early CT Score) - Ganglionic level infarction (caudate, lentiform nuclei, internal capsule, insula, M1-M3 cortex): 7 - Supraganglionic infarction (M4-M6  cortex): 3 Total score (0-10 with 10 being normal): 10 IMPRESSION: No evidence of acute intracranial process. ASPECTS is 10. Imaging results were communicated on 02/09/2023 at 7:33 pm to provider Dr. Derry Lory via secure text paging. Electronically Signed   By: Wiliam Ke M.D.   On: 02/09/2023 19:36    Vitals:   02/10/23 1100 02/10/23 1200 02/10/23 1300 02/10/23 1400  BP: 117/68 123/77 (!) 148/79 92/73  Pulse: 76 69 85 88  Resp: 16 20 (!) 22 (!) 26  Temp:      TempSrc:      SpO2: 91% 93% 93% 93%  Weight:      Height:         PHYSICAL EXAM General:  Alert, well-nourished, well-developed patient in no acute distress Psych:  Mood and affect appropriate for situation CV: Regular rate and rhythm on monitor Respiratory:  Regular, unlabored respirations on room air GI: Abdomen soft and nontender   NEURO:  Mental Status: Awake and alert. Difficulty assessing true orientation due to aphasia. Does nod yes and no. Speech/Language: speech is without dysarthria  Mixed aphasia, able to repeat. Unable to identify objects, unable to read Difficulty following commands, able to mimic Cranial Nerves:  II: PERRL. Blinks to threat bilaterally.    III, IV, VI: EOMI. Eyelids elevate symmetrically.  V: Sensation is intact to light touch and symmetrical to face.  VII: Mild right lower facial asymmetry while smiling VIII: hearing intact to voice. IX, X: Palate elevates symmetrically. Phonation is normal.  VH:QIONGEXB shrug 5/5. XII: tongue is midline without fasciculations. Motor: 5/5 strength to all muscle groups tested.  Tone: is normal and bulk is normal Sensation- Intact to light touch bilaterally.  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 ASPECTS 10.    CTA  head & neck No LVO MRI  Cortical restricted diffusion with ADC correlate in the left frontal lobe, including the operculum, with minimal associated T2 hyperintense signal, suggestive of hyperacute infarct. No evidence of hemorrhagic transformation or significant mass effect. 24 hour CT Head- pending  EEG- negative for seizure 2D Echo Pending LDL 62 HgbA1c 6.0 VTE prophylaxis - SCDs aspirin 81 mg daily prior to admission, no antithrombotic for 24 hours post TNK Therapy recommendations:  Pending Disposition:  ICU  Hypertension Home meds:  Metoprolol succinate Stable Blood Pressure Goal: BP less than 180/105   Hyperlipidemia Home meds:  Atorvastatin 40mg , resumed in hospital LDL 62, goal < 70 Continue statin at discharge  Other Stroke Risk Factors ETOH use, alcohol level <10, advised to drink no more than 2 drink(s) a day Obesity, Body mass index is 29.76 kg/m., BMI >/= 30 associated with increased stroke risk, recommend weight loss, diet and exercise as appropriate  Aortic stenosis s/p AVR Coronary artery disease  Other Active Problems AKI vs CKD Cr 1.45 -> 1.60 Baseline appears to be around 1.23-1.40  Hospital day # 1  Patient seen and examined by NP/APP with MD. MD to update note as needed.   Elmer Picker, DNP, FNP-BC [facility] Pager: [phone]  STROKE MD NOTE :  I have personally obtained history,examined this patient, reviewed notes, independently viewed imaging studies, participated in medical decision making and plan of care.ROS completed by me personally and pertinent positives fully documented  I have made any additions or clarifications directly to the above note. Agree with note above.  Patient presented with sudden onset of aphasia without an LVO and was given IV TNK but without significant clinical improvement yet.  Continue close neurological monitoring and strict blood pressure control as per post thrombolytic protocol.  Mobilize out of bed.   Therapy consults.  Continue ongoing stroke workup.  Long discussion with patient and wife and daughters at the bedside and answered questions. Patient also appears to be at risk for sleep apnea and after discussion with wife and daughters and is interested in consideration for participation in the sleep smart stroke prevention study.  It was made clear to them that study participation is voluntary and patient can withdraw consent at any point in the future if not satisfied.  Family was given informed consent and adequate time to review and ask questions and decide.  No studies specific procedure was done prior to wife signing informed consent. This patient is critically ill and at significant risk of neurological worsening, death and care requires constant monitoring of vital signs, hemodynamics,respiratory and cardiac monitoring, extensive review of multiple databases, frequent neurological assessment, discussion with family, other specialists and medical decision making of high complexity.I have made any additions or clarifications directly to the above note.This critical care time does not reflect procedure time, or teaching time or supervisory time of PA/NP/Med Resident etc but could involve care discussion time.  I spent 30 minutes of neurocritical care time  in the care of  this patient.     Delia Heady, MD Medical Director [facility] Pager: [phone] 02/10/2023 3:43 PM   To contact Stroke Continuity provider, please refer to [email or web address]. After hours, contact General Neurology

## 2023-02-10 NOTE — Plan of Care (Signed)
  Problem: Education: Goal: Knowledge of disease or condition will improve Outcome: Progressing   Problem: Elimination: Goal: Will not experience complications related to urinary retention Outcome: Progressing   Problem: Pain Managment: Goal: General experience of comfort will improve Outcome: Progressing   Problem: Safety: Goal: Ability to remain free from injury will improve Outcome: Progressing

## 2023-02-10 NOTE — Progress Notes (Signed)
Per patients daughter, patient has had tremors in bilateral hands for several  years.  Patient also began having repetitive movements in lower jaw for approximately two years prior to this admission.

## 2023-02-11 ENCOUNTER — Inpatient Hospital Stay (HOSPITAL_COMMUNITY): Payer: Medicare Other

## 2023-02-11 DIAGNOSIS — R4701 Aphasia: Secondary | ICD-10-CM | POA: Diagnosis not present

## 2023-02-11 DIAGNOSIS — I6389 Other cerebral infarction: Secondary | ICD-10-CM

## 2023-02-11 LAB — ECHOCARDIOGRAM COMPLETE
AR max vel: 2.77 cm2
AV Area VTI: 2.9 cm2
AV Area mean vel: 2.84 cm2
AV Mean grad: 4 mm[Hg]
AV Peak grad: 8.2 mm[Hg]
Ao pk vel: 1.43 m/s
Area-P 1/2: 1.94 cm2
Height: 71 in
MV VTI: 1.44 cm2
S' Lateral: 3.1 cm
Single Plane A4C EF: 49.2 %
Weight: 3414.48 [oz_av]

## 2023-02-11 LAB — BASIC METABOLIC PANEL
Anion gap: 7 (ref 5–15)
BUN: 18 mg/dL (ref 8–23)
CO2: 28 mmol/L (ref 22–32)
Calcium: 9 mg/dL (ref 8.9–10.3)
Chloride: 103 mmol/L (ref 98–111)
Creatinine, Ser: 1.59 mg/dL — ABNORMAL HIGH (ref 0.61–1.24)
GFR, Estimated: 44 mL/min — ABNORMAL LOW (ref >60)
Glucose, Bld: 128 mg/dL — ABNORMAL HIGH (ref 70–99)
Potassium: 4.1 mmol/L (ref 3.5–5.1)
Sodium: 138 mmol/L (ref 135–145)

## 2023-02-11 LAB — CBC
HCT: 43.3 % (ref 39.0–52.0)
Hemoglobin: 14.1 g/dL (ref 13.0–17.0)
MCH: 32.4 pg (ref 26.0–34.0)
MCHC: 32.6 g/dL (ref 30.0–36.0)
MCV: 99.5 fL (ref 80.0–100.0)
Platelets: 225 10*3/uL (ref 150–400)
RBC: 4.35 MIL/uL (ref 4.22–5.81)
RDW: 12.9 % (ref 11.5–15.5)
WBC: 9.7 10*3/uL (ref 4.0–10.5)
nRBC: 0 % (ref 0.0–0.2)

## 2023-02-11 LAB — MAGNESIUM: Magnesium: 2.2 mg/dL (ref 1.7–2.4)

## 2023-02-11 NOTE — Progress Notes (Addendum)
STROKE TEAM PROGRESS NOTE   BRIEF HPI [name] is a 77 y.o. male with history of GERD, aortic valve stenosis s/p AVR, CAD, HTN, HLD who presents with aphasia. He was fine at 1500. Wife left and returned at 1830 to find him mostly mute, unable to comprehend speech, struggling with speech. She called EMS and he was brought in as a stroke code.   SIGNIFICANT HOSPITAL EVENTS 10/18- received TNK  10/19- CT with hemorrhage in left temporal lobe  INTERIM HISTORY/SUBJECTIVE Wife and daughters at the bedside. Still has significant aphasia. Repetition intact, unable to read. Mimics but does not consistently follow commands. EEG negative for seizure. CT this morning showed stable left temporal hemorrhage.  Blood pressure adequately controlled.  Transfer to medical telemetry today.  Patient has had tremors in bilateral hands for several years (approx 5 yrs). Patient also began having repetitive movements in lower jaw approximately two years prior to this admission  Follow up with EP for loop recorder tomorrow  Patient's wife did sign consent for participation in the sleep smart study and he tested positive for obstructive sleep apnea on the overnight NOx 3 monitor. OBJECTIVE  CBC    Component Value Date/Time   WBC 8.9 02/09/2023 1922   RBC 4.38 02/09/2023 1922   HGB 14.3 02/09/2023 1930   HCT 42.0 02/09/2023 1930   PLT 244 02/09/2023 1922   MCV 98.6 02/09/2023 1922   MCH 32.6 02/09/2023 1922   MCHC 33.1 02/09/2023 1922   RDW 12.7 02/09/2023 1922   LYMPHSABS 2.6 02/09/2023 1922   MONOABS 0.7 02/09/2023 1922   EOSABS 0.5 02/09/2023 1922   BASOSABS 0.1 02/09/2023 1922    BMET    Component Value Date/Time   NA 136 02/09/2023 1930   K 4.3 02/09/2023 1930   CL 107 02/09/2023 1930   CO2 26 02/09/2023 1922   GLUCOSE 152 (H) 02/09/2023 1930   BUN 22 02/09/2023 1930   CREATININE 1.60 (H) 02/09/2023 1930   CREATININE 1.30 (H) 03/03/2015 1703   CALCIUM 9.2 02/09/2023 1922   GFRNONAA  50 (L) 02/09/2023 1922    IMAGING past 24 hours CT HEAD WO CONTRAST ( )  Result Date: 02/10/2023 CLINICAL DATA:  Stroke, follow up 24 hours post tnkase CT head to be done at 1930 on 02/10/23 EXAM: CT HEAD WITHOUT CONTRAST TECHNIQUE: Contiguous axial images were obtained from the base of the skull through the vertex without intravenous contrast. RADIATION DOSE REDUCTION: This exam was performed according to the departmental dose-optimization program which includes automated exposure control, adjustment of the mA and/or kV according to patient size and/or use of iterative reconstruction technique. COMPARISON:  None Available. FINDINGS: Brain: Acute 1.4 x 1.3 x 1.5 cm intraparenchymal hemorrhage in the posterior left temporal lobe (approximate volume of 1.4 mL). Evolving infarct in the left frontal lobe. No significant mass effect or midline shift. No hydrocephalus or extra-axial fluid collection. Remote right cerebellar infarct. Vascular: No hyperdense vessel identified. Skull: No acute fracture. Sinuses/Orbits: Clear sinuses.  No acute findings. Other: No mastoid effusions. IMPRESSION: 1. Acute 1.5 cm intraparenchymal hemorrhage in the posterior left temporal lobe. 2. Evolving infarct in the left frontal lobe. 3. No significant mass effect or midline shift. Findings discussed with Dr. Wilford Corner via telephone at 6:36 p.m. Electronically Signed   By: Feliberto Harts M.D.   On: 02/10/2023 18:38   EEG adult  Result Date: 02/10/2023 Charlsie Quest, MD     02/10/2023  1:42 PM Patient Name: Viona Gilmore  Sizemore MRN: [id number] Epilepsy Attending: Charlsie Quest Referring Physician/Provider: Elmer Picker, NP Date: 02/10/2023 Duration: 36 mins Patient history: 77 y.o. male with GERD, aortic valve stenosis s/p AVR, CAD, HTN, HLD who presents with aphasia. EEG to evaluate for seizure Level of alertness: Awake AEDs during EEG study: None Technical aspects: This EEG study was done with scalp electrodes positioned  according to the 10-20 International system of electrode placement. Electrical activity was reviewed with band pass filter of 1-70Hz , sensitivity of 7 uV/mm, display speed of 3mm/sec with a 60Hz  notched filter applied as appropriate. EEG data were recorded continuously and digitally stored.  Video monitoring was available and reviewed as appropriate. Description: The posterior dominant rhythm consists of 7.5Hz  activity of moderate voltage (25-35 uV) seen predominantly in posterior head regions, symmetric and reactive to eye opening and eye closing. EEG showed intermittent 3 to 5 Hz theta-delta slowing in left fronto-temporal region. Sharp waves were noted in left fronto-temporal region. Hyperventilation and photic stimulation were not performed.   ABNORMALITY -Sharp wave,  left fronto-temporal region - Intermittent slow,  left fronto-temporal region IMPRESSION: This study evidence of potential epileptogenicity and cortical dysfunction arising from left fronto-temporal region. No seizures were seen throughout the recording. Priyanka [name]    Vitals:   02/11/23 0600 02/11/23 0700 02/11/23 0730 02/11/23 0800  BP: 129/76  116/71 118/68  Pulse: 91 67 66 83  Resp:  18 15 19   Temp:      TempSrc:      SpO2: 95% 91% 92% 95%  Weight:      Height:         PHYSICAL EXAM General:  Alert, well-nourished, well-developed patient in no acute distress Psych:  Mood and affect appropriate for situation CV: Regular rate and rhythm on monitor Respiratory:  Regular, unlabored respirations on room air GI: Abdomen soft and nontender   NEURO:  Mental Status: Awake and alert. Difficulty assessing true orientation due to aphasia. Does nod yes and no. Speech/Language: speech is without dysarthria  Mixed aphasia, able to repeat. Unable to identify objects, unable to read Difficulty following commands, able to mimic Cranial Nerves:  II: PERRL. Blinks to threat bilaterally.    III, IV, VI: EOMI. Eyelids elevate  symmetrically.  V: Sensation is intact to light touch and symmetrical to face.  VII: Mild right lower facial asymmetry while smiling VIII: hearing intact to voice. IX, X: Palate elevates symmetrically. Phonation is normal.  XL:[id number] shrug 5/5. XII: tongue is midline without fasciculations. Motor: 5/5 strength to all muscle groups tested.  Tone: is normal and bulk is normal Sensation- Intact to light touch bilaterally.  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 ASPECTS 10.    CTA head & neck No LVO MRI  Cortical restricted diffusion with ADC correlate in the left frontal lobe, including the operculum, with minimal associated T2 hyperintense signal, suggestive of hyperacute infarct. No evidence of hemorrhagic transformation or significant mass effect. 24 hour CT Head- Acute 1.5 cm intraparenchymal hemorrhage in the posterior left temporal lobe. Evolving infarct in the left frontal lobe. 10/20 CT Head- No progression of ischemia and parenchymal hemorrhage when compared to yesterday. EEG- negative for seizure 2D Echo Left atria is mildly dilated, EF 55-60% LDL 62 HgbA1c 6.0  VTE prophylaxis - SCDs aspirin 81 mg daily prior to admission, no antithrombotic for 24 hours post TNK Therapy recommendations:  Pending Disposition:  Medical Tele  Hypertension Home meds:  Metoprolol succinate Stable Blood Pressure Goal: BP less than 180/105   Hyperlipidemia Home meds:  Atorvastatin 40mg , resumed in hospital LDL 62, goal < 70 Continue statin at discharge  Other Stroke Risk Factors ETOH use, alcohol level <10, advised to drink no more than 2 drink(s) a day Obesity, Body mass index is 29.76 kg/m., BMI >/= 30 associated with  increased stroke risk, recommend weight loss, diet and exercise as appropriate  Aortic stenosis s/p AVR Coronary artery disease  Other Active Problems AKI vs CKD Cr 1.45 -> 1.60 Baseline appears to be around 1.23-1.40  Hospital day # 2  Patient seen and examined by NP/APP with MD. MD to update note as needed.   Elmer Picker, DNP, FNP-BC [facility] Pager: [phone]  I have personally obtained history,examined this patient, reviewed notes, independently viewed imaging studies, participated in medical decision making and plan of care.ROS completed by me personally and pertinent positives fully documented  I have made any additions or clarifications directly to the above note. Agree with note above.  Patient developed small left temporal parenchymal hemorrhage post TNK but blood pressure has been adequately controlled and follow-up CT this morning shows stable appearance of the hemorrhage.  We will hold antiplatelet therapy for now and continue close blood pressure control.  Mobilize out of bed.  Continue ongoing therapies.  Transfer to neurology floor bed.  Patient is participating in the sleep smart study and tested positive for sleep apnea.  We will try CPAP mask tolerability test tonight.  Long discussion at the bedside with the patient, wife and daughters and answered questions.  Greater than 50% time during this 50-minute visit was spent on counseling and coordination of care and discussion with patient and family and answering questions  Delia Heady, MD Medical Director [facility] Pager: [phone] 02/11/2023 2:54 PM    To contact Stroke Continuity provider, please refer to [email or web address]. After hours, contact General Neurology

## 2023-02-11 NOTE — Progress Notes (Signed)
RN was not notified that patient BP at 1744 was 98/55, noticed before the end of my shift that patient BP was low & under goal specified by MD. Patient rechecked at 1930 and BP noted at 133/76 MAP 91 and HR 86. Oncoming night shift nurse was made aware of the recheck by me (Nurse). Patient noted in stable condition, no acute concerns at this time, Wife at bedside; bed in lowest position and call light placed within reach.

## 2023-02-11 NOTE — Plan of Care (Signed)
  Problem: Education: Goal: Knowledge of disease or condition will improve Outcome: Progressing Goal: Knowledge of secondary prevention will improve (MUST DOCUMENT ALL) Outcome: Progressing   Problem: Ischemic Stroke/TIA Tissue Perfusion: Goal: Complications of ischemic stroke/TIA will be minimized Outcome: Progressing   Problem: Health Behavior/Discharge Planning: Goal: Ability to manage health-related needs will improve Outcome: Progressing Goal: Goals will be collaboratively established with patient/family Outcome: Progressing   Problem: Self-Care: Goal: Ability to participate in self-care as condition permits will improve Outcome: Progressing Goal: Verbalization of feelings and concerns over difficulty with self-care will improve Outcome: Progressing Goal: Ability to communicate needs accurately will improve Outcome: Progressing   Problem: Nutrition: Goal: Risk of aspiration will decrease Outcome: Progressing Goal: Dietary intake will improve Outcome: Progressing   Problem: Education: Goal: Knowledge of General Education information will improve Description: Including pain rating scale, medication(s)/side effects and non-pharmacologic comfort measures Outcome: Progressing   Problem: Health Behavior/Discharge Planning: Goal: Ability to manage health-related needs will improve Outcome: Progressing   Problem: Clinical Measurements: Goal: Ability to maintain clinical measurements within normal limits will improve Outcome: Progressing Goal: Will remain free from infection Outcome: Progressing Goal: Diagnostic test results will improve Outcome: Progressing Goal: Respiratory complications will improve Outcome: Progressing Goal: Cardiovascular complication will be avoided Outcome: Progressing   Problem: Activity: Goal: Risk for activity intolerance will decrease Outcome: Progressing   Problem: Nutrition: Goal: Adequate nutrition will be maintained Outcome:  Progressing   Problem: Coping: Goal: Level of anxiety will decrease Outcome: Progressing   Problem: Elimination: Goal: Will not experience complications related to bowel motility Outcome: Progressing Goal: Will not experience complications related to urinary retention Outcome: Progressing   Problem: Pain Managment: Goal: General experience of comfort will improve Outcome: Progressing   Problem: Safety: Goal: Ability to remain free from injury will improve Outcome: Progressing   Problem: Skin Integrity: Goal: Risk for impaired skin integrity will decrease Outcome: Progressing

## 2023-02-11 NOTE — Evaluation (Signed)
Occupational Therapy Evaluation Patient Details Name: Zachary [name] MRN: [id number] DOB: 1945-11-15 Today's Date: 02/11/2023   History of Present Illness 77 y.o. male GERD, aortic valve stenosis s/p AVR, CAD, HTN, HLD who presents with aphasia.  MR Brain:Acute left MCA stroke s/p tnkase.  CT Head:left  posterior temporal cortex hemorrhage measuring 15 mm, unchanged.   Clinical Impression   Patient admitted for the diagnosis above.  PTA he lives at home with his spouse, and needed no assist for any aspect of mobility, ADL and iADL.  Currently he needs no physical assist, and is generalized supervision for ADL and in room mobility due to lines only.  OT can follow in the acute setting for command following and to monitor for any functional changes.  No post acute OT is anticipated.  OOB and in room mobility cleared through RN.       If plan is discharge home, recommend the following: Assist for transportation    Functional Status Assessment  Patient has had a recent decline in their functional status and demonstrates the ability to make significant improvements in function in a reasonable and predictable amount of time.  Equipment Recommendations  None recommended by OT    Recommendations for Other Services       Precautions / Restrictions Precautions Precautions: Fall Restrictions Weight Bearing Restrictions: No      Mobility Bed Mobility Overal bed mobility: Independent               Patient Response: Cooperative  Transfers Overall transfer level: Modified independent                        Balance Overall balance assessment: No apparent balance deficits (not formally assessed)                                         ADL either performed or assessed with clinical judgement   ADL                                         General ADL Comments: Generalized supervision due to line only.     Vision   Vision  Assessment?: No apparent visual deficits     Perception Perception: Within Functional Limits       Praxis Praxis: WFL       Pertinent Vitals/Pain Pain Assessment Pain Assessment: No/denies pain Pain Intervention(s): Monitored during session     Extremity/Trunk Assessment Upper Extremity Assessment Upper Extremity Assessment: Overall WFL for tasks assessed;LUE deficits/detail RUE Sensation: WNL RUE Coordination: WNL LUE Deficits / Details: Arthritic shoulder LUE Sensation: WNL LUE Coordination: WNL   Lower Extremity Assessment Lower Extremity Assessment: Defer to PT evaluation   Cervical / Trunk Assessment Cervical / Trunk Assessment: Normal   Communication Communication Communication: Difficulty following commands/understanding;Difficulty communicating thoughts/reduced clarity of speech Following commands: Follows multi-step commands inconsistently   Cognition Arousal: Alert Behavior During Therapy: Flat affect Overall Cognitive Status: Difficult to assess                                 General Comments: Following about 50% of commands VC's and gestures.     General Comments   VSS on RA  Exercises     Shoulder Instructions      Home Living Family/patient expects to be discharged to:: Private residence Living Arrangements: Spouse/significant other Available Help at Discharge: Family;Available 24 hours/day Type of Home: House Home Access: Stairs to enter [employer] of Steps: 2 Entrance Stairs-Rails: None Home Layout: One level     Bathroom Shower/Tub: Producer, television/film/video: Standard Bathroom Accessibility: Yes How Accessible: Accessible via walker Home Equipment: Shower seat      Lives With: Spouse    Prior Functioning/Environment Prior Level of Function : Independent/Modified Independent;Driving             Mobility Comments: No AD, working on his 47 [place] ADLs Comments: Ind        OT  Problem List: Decreased safety awareness;Other (comment) (Command following and speech production)      OT Treatment/Interventions: Self-care/ADL training;Cognitive remediation/compensation    OT Goals(Current goals can be found in the care plan section) Acute Rehab OT Goals Patient Stated Goal: Return home OT Goal Formulation: With patient/family Time For Goal Achievement: 02/26/23 Potential to Achieve Goals: Good ADL Goals Additional ADL Goal #1: patient will follow 75% of commands with Min VC's  OT Frequency: Min 1X/week    Co-evaluation              AM-PAC OT "6 Clicks" Daily Activity     Outcome Measure Help from another person eating meals?: None Help from another person taking care of personal grooming?: None Help from another person toileting, which includes using toliet, bedpan, or urinal?: A Little Help from another person bathing (including washing, rinsing, drying)?: A Little Help from another person to put on and taking off regular upper body clothing?: None Help from another person to put on and taking off regular lower body clothing?: A Little 6 Click Score: 21   End of Session Nurse Communication: Mobility status  Activity Tolerance: Patient tolerated treatment well Patient left: in chair;with call bell/phone within reach;with family/visitor present  OT Visit Diagnosis: Cognitive communication deficit (R41.841) Symptoms and signs involving cognitive functions: Cerebral infarction;Nontraumatic intracerebral hemorrhage                Time: 3086-5784 OT Time Calculation (min): 22 min Charges:  OT General Charges $OT Visit: 1 Visit OT Evaluation $OT Eval Moderate Complexity: 1 Mod  02/11/2023  RP, OTR/L  Acute Rehabilitation Services  Office:  [phone]   Zachary [name] 02/11/2023, 10:49 AM

## 2023-02-11 NOTE — Progress Notes (Signed)
  Echocardiogram 2D Echocardiogram has been performed.  Delcie Roch 02/11/2023, 9:05 AM

## 2023-02-12 ENCOUNTER — Encounter (HOSPITAL_COMMUNITY)
Admission: EM | Disposition: A | Discharge status: Home / Self Care | Payer: Self-pay | Source: Home / Self Care | Attending: Neurology

## 2023-02-12 ENCOUNTER — Other Ambulatory Visit: Payer: Self-pay | Admitting: Nurse Practitioner

## 2023-02-12 DIAGNOSIS — R4701 Aphasia: Secondary | ICD-10-CM | POA: Diagnosis not present

## 2023-02-12 DIAGNOSIS — I639 Cerebral infarction, unspecified: Secondary | ICD-10-CM | POA: Diagnosis not present

## 2023-02-12 HISTORY — PX: LOOP RECORDER INSERTION: EP1214

## 2023-02-12 LAB — GLUCOSE, CAPILLARY: Glucose-Capillary: 117 mg/dL — ABNORMAL HIGH (ref 70–99)

## 2023-02-12 LAB — MAGNESIUM: Magnesium: 2.1 mg/dL (ref 1.7–2.4)

## 2023-02-12 SURGERY — LOOP RECORDER INSERTION
Anesthesia: LOCAL

## 2023-02-12 MED ORDER — LIDOCAINE-EPINEPHRINE 1 %-1:100000 IJ SOLN
INTRAMUSCULAR | Status: AC
  Filled 2023-02-12: qty 1

## 2023-02-12 MED ORDER — LIDOCAINE-EPINEPHRINE 1 %-1:100000 IJ SOLN
INTRAMUSCULAR | Status: DC | PRN
  Administered 2023-02-12: 30 mL

## 2023-02-12 SURGICAL SUPPLY — 2 items
PACK LOOP INSERTION (CUSTOM PROCEDURE TRAY) ×1 IMPLANT
SYSTEM MONITOR REVEAL LINQ II (Prosthesis & Implant Heart) IMPLANT

## 2023-02-12 NOTE — Discharge Summary (Addendum)
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  Patient Active Problem List   Diagnosis Date Noted   Aphasia 02/09/2023   S/P AVR 03/25/2015   CAD (coronary artery disease), native coronary artery 03/10/2015   Hypertensive heart disease 03/06/2013    Class: Chronic   Hyperlipidemia 03/06/2013    Class: Chronic   GERD (gastroesophageal reflux disease) 03/06/2013     Secondary Diagnoses: Hypertension Hyperlipidemia Obstructive sleep apnea-screen for the sleep smart study but a screen failure as he was unable to tolerate CPAP mask Aphasia  Allergies as of 02/12/2023   No Known Allergies      Medication List     STOP taking these medications    aspirin EC 81 MG tablet   metoprolol succinate 25 MG 24 hr tablet Commonly known as: TOPROL-XL       TAKE these medications    acetaminophen 325 MG tablet Commonly known as: TYLENOL Take 650 mg by mouth every 6 (six) hours as needed for mild pain.   amoxicillin 500 MG tablet Commonly known as: AMOXIL Take 2,000 mg by mouth as needed (Take one (1) hour prior to dental procedures.).   atorvastatin 40 MG tablet Commonly known as: LIPITOR Take 40 mg by mouth daily.   Melatonin 10 MG Tbdp Take 1 tablet by mouth at bedtime.   One A Day Mens VitaCraves Chew Chew 1 Dose by mouth daily.        LABORATORY STUDIES CBC    Component Value Date/Time   WBC 9.7 02/11/2023 2051   RBC 4.35 02/11/2023 2051   HGB 14.1 02/11/2023 2051   HCT 43.3 02/11/2023 2051   PLT 225 02/11/2023 2051   MCV 99.5 02/11/2023 2051   MCH 32.4  02/11/2023 2051   MCHC 32.6 02/11/2023 2051   RDW 12.9 02/11/2023 2051   LYMPHSABS 2.6 02/09/2023 1922   MONOABS 0.7 02/09/2023 1922   EOSABS 0.5 02/09/2023 1922   BASOSABS 0.1 02/09/2023 1922   CMP    Component Value Date/Time   NA 138 02/11/2023 2051   K 4.1 02/11/2023 2051   CL 103 02/11/2023 2051   CO2 28 02/11/2023 2051   GLUCOSE 128 (H) 02/11/2023 2051   BUN 18 02/11/2023 2051   CREATININE 1.59 (H) 02/11/2023 2051   CREATININE 1.30 (H) 03/03/2015 1703   CALCIUM 9.0 02/11/2023 2051   PROT 7.0 02/09/2023 1922   ALBUMIN 3.9 02/09/2023 1922   AST 30 02/09/2023 1922   ALT 25 02/09/2023 1922   ALKPHOS 82 02/09/2023 1922   BILITOT 0.5 02/09/2023 1922   GFRNONAA 44 (L) 02/11/2023 2051   GFRAA >60 03/28/2015 0235   COAGS Lab Results  Component Value Date   INR 1.0 02/09/2023   INR 1.31 03/25/2015   INR 1.04 03/24/2015   Lipid Panel    Component Value Date/Time   CHOL 115 02/10/2023 0327   TRIG 95 02/10/2023 0327   HDL 34 (L) 02/10/2023 0327   CHOLHDL 3.4 02/10/2023 0327   VLDL 19 02/10/2023 0327   LDLCALC 62 02/10/2023 0327   HgbA1C  Lab Results  Component Value Date   HGBA1C 6.0 (H) 02/09/2023   Urine Drug Screen negative Alcohol Level    Component Value Date/Time   [facility] <10 02/09/2023 1922     SIGNIFICANT DIAGNOSTIC STUDIES ECHOCARDIOGRAM COMPLETE  Result Date: 02/11/2023    ECHOCARDIOGRAM REPORT   Patient Name:   Zachary [name] Date of Exam: 02/11/2023 Medical Rec #:  [id number]      Height:       71.0 in Accession #:    [id number]     Weight:       213.4 lb Date of Birth:  1945/10/26      BSA:          2.168 m Patient Age:    77 years       BP:           129/76 mmHg Patient Gender: M              HR:           71 bpm. Exam Location:  Inpatient Procedure: 2D Echo, Color Doppler and Cardiac Doppler Indications:    stroke  History:        Patient has prior history of Echocardiogram examinations, most                 recent 09/21/2020. CAD; Risk  Factors:Hypertension and                 Dyslipidemia.                 Aortic Valve: Edwards Magna ease valve is present in the aortic                 position. Procedure Date: 03/25/15.  Sonographer:    Delcie Roch RDCS Referring Phys: [id number] [facility] IMPRESSIONS  1. Left ventricular ejection fraction, by estimation, is 55 to 60%. The left ventricle has normal function. The left ventricle has no regional wall motion abnormalities. There is mild left ventricular hypertrophy of the lateral segment. Left ventricular  diastolic parameters are consistent with Grade I diastolic dysfunction (impaired relaxation). Elevated left ventricular end-diastolic pressure.  2. Right ventricular systolic function is normal. The right ventricular size is normal. There is normal pulmonary artery systolic pressure.  3. Left atrial size was moderately dilated.  4. The mitral valve is abnormal. No evidence of mitral valve regurgitation. Mild mitral stenosis. The mean mitral valve gradient is 5.0 mmHg. Moderate to severe mitral annular calcification.  5. The aortic valve has been repaired/replaced. Aortic valve regurgitation is not visualized. No aortic stenosis is present. There is a Oncologist present in the aortic position. Procedure Date: 03/25/15. AV mean gradient is 4 mm Hg.  6. Aortic dilatation noted. There is mild dilatation of the ascending aorta, measuring 40 mm.  7. The inferior vena cava is normal in size with greater than 50% respiratory variability, suggesting right atrial pressure of 3 mmHg. Comparison(s): No significant change from prior study. FINDINGS  Left Ventricle: Left ventricular ejection fraction, by estimation, is 55 to 60%. The left ventricle has normal function. The left ventricle has no regional wall motion abnormalities. The left ventricular internal cavity size was normal in size. There is  mild left ventricular hypertrophy of the lateral segment. Left ventricular diastolic  parameters are consistent with Grade I diastolic dysfunction (impaired relaxation). Elevated left ventricular end-diastolic pressure. Right Ventricle: The right ventricular size is normal. No increase in right ventricular wall thickness. Right ventricular systolic function  is normal. There is normal pulmonary artery systolic pressure. The tricuspid regurgitant velocity is 1.85 m/s, and  with an assumed right atrial pressure of 3 mmHg, the estimated right ventricular systolic pressure is 16.7 mmHg. Left Atrium: Left atrial size was moderately dilated. Right Atrium: Right atrial size was normal in size. Pericardium: There is no evidence of pericardial effusion. Mitral Valve: The mitral valve is abnormal. There is severe calcification of the anterior mitral valve leaflet(s). Moderate to severe mitral annular calcification. No evidence of mitral valve regurgitation. Mild mitral valve stenosis. MV peak gradient, 13.7 mmHg. The mean mitral valve gradient is 5.0 mmHg. Tricuspid Valve: The tricuspid valve is grossly normal. Tricuspid valve regurgitation is trivial. No evidence of tricuspid stenosis. Aortic Valve: The aortic valve has been repaired/replaced. Aortic valve regurgitation is not visualized. No aortic stenosis is present. Aortic valve mean gradient measures 4.0 mmHg. Aortic valve peak gradient measures 8.2 mmHg. Aortic valve area, by VTI measures 2.90 cm. There is a Oncologist present in the aortic position. Procedure Date: 03/25/15. Pulmonic Valve: The pulmonic valve was not well visualized. Pulmonic valve regurgitation is not visualized. No evidence of pulmonic stenosis. Aorta: The aortic root is normal in size and structure and aortic dilatation noted. There is mild dilatation of the ascending aorta, measuring 40 mm. Venous: The inferior vena cava is normal in size with greater than 50% respiratory variability, suggesting right atrial pressure of 3 mmHg. IAS/Shunts: No atrial level shunt detected  by color flow Doppler.  LEFT VENTRICLE PLAX 2D LVIDd:         4.80 cm     Diastology LVIDs:         3.10 cm     LV e' medial:    5.11 cm/s LV PW:         1.20 cm     LV E/e' medial:  22.3 LV IVS:        1.00 cm     LV e' lateral:   5.22 cm/s LVOT diam:     2.40 cm     LV E/e' lateral: 21.8 LV SV:         76 LV SV Index:   35 LVOT Area:     4.52 cm  LV Volumes (MOD) LV vol d, MOD A4C: 87.2 ml LV vol s, MOD A4C: 44.3 ml LV SV MOD A4C:     87.2 ml RIGHT VENTRICLE            IVC RV Basal diam:  2.50 cm    IVC diam: 1.60 cm RV S prime:     7.83 cm/s TAPSE (M-mode): 1.2 cm LEFT ATRIUM             Index        RIGHT ATRIUM           Index LA diam:        4.70 cm 2.17 cm/m   RA Area:     12.80 cm LA Vol (A2C):   80.9 ml 37.32 ml/m  RA Volume:   26.50 ml  12.23 ml/m LA Vol (A4C):   94.4 ml 43.55 ml/m LA Biplane Vol: 90.9 ml 41.94 ml/m  AORTIC VALVE AV Area (Vmax):    2.77 cm AV Area (Vmean):   2.84 cm AV Area (VTI):     2.90 cm AV Vmax:           143.00 cm/s AV Vmean:  92.300 cm/s AV VTI:            0.262 m AV Peak Grad:      8.2 mmHg AV Mean Grad:      4.0 mmHg LVOT Vmax:         87.60 cm/s LVOT Vmean:        57.850 cm/s LVOT VTI:          0.168 m LVOT/AV VTI ratio: 0.64  AORTA Ao Root diam: 3.40 cm Ao Asc diam:  4.00 cm MITRAL VALVE                TRICUSPID VALVE MV Area (PHT): 1.94 cm     TR Peak grad:   13.7 mmHg MV Area VTI:   1.44 cm     TR Vmax:        185.00 cm/s MV Peak grad:  13.7 mmHg MV Mean grad:  5.0 mmHg     SHUNTS MV Vmax:       1.85 m/s     Systemic VTI:  0.17 m MV Vmean:      103.0 cm/s   Systemic Diam: 2.40 cm MV Decel Time: 391 msec MV E velocity: 114.00 cm/s MV A velocity: 169.00 cm/s MV E/A ratio:  0.67 Vishnu Priya Mallipeddi Electronically signed by Winfield Rast Mallipeddi Signature Date/Time: 02/11/2023/9:15:29 AM    Final    CT HEAD WO CONTRAST ( )  Result Date: 02/11/2023 CLINICAL DATA:  Hemorrhagic stroke follow-up EXAM: CT HEAD WITHOUT CONTRAST TECHNIQUE: Contiguous  axial images were obtained from the base of the skull through the vertex without intravenous contrast. RADIATION DOSE REDUCTION: This exam was performed according to the departmental dose-optimization program which includes automated exposure control, adjustment of the mA and/or kV according to patient size and/or use of iterative reconstruction technique. COMPARISON:  Yesterday FINDINGS: Brain: Infarcts in the left frontal by prior brain MRI with left posterior temporal cortex hemorrhage measuring 15 mm, unchanged. Small chronic bilateral cerebellar and left caudate infarcts. No hydrocephalus, mass, or collection. Vascular: No hyperdense vessel or unexpected calcification. Skull: Normal. Negative for fracture or focal lesion. Sinuses/Orbits: No acute finding. IMPRESSION: No progression of ischemia and parenchymal hemorrhage when compared to yesterday. Electronically Signed   By: Tiburcio Pea M.D.   On: 02/11/2023 08:12   CT HEAD WO CONTRAST ( )  Result Date: 02/10/2023 CLINICAL DATA:  Stroke, follow up 24 hours post tnkase CT head to be done at 1930 on 02/10/23 EXAM: CT HEAD WITHOUT CONTRAST TECHNIQUE: Contiguous axial images were obtained from the base of the skull through the vertex without intravenous contrast. RADIATION DOSE REDUCTION: This exam was performed according to the departmental dose-optimization program which includes automated exposure control, adjustment of the mA and/or kV according to patient size and/or use of iterative reconstruction technique. COMPARISON:  None Available. FINDINGS: Brain: Acute 1.4 x 1.3 x 1.5 cm intraparenchymal hemorrhage in the posterior left temporal lobe (approximate volume of 1.4 mL). Evolving infarct in the left frontal lobe. No significant mass effect or midline shift. No hydrocephalus or extra-axial fluid collection. Remote right cerebellar infarct. Vascular: No hyperdense vessel identified. Skull: No acute fracture. Sinuses/Orbits: Clear sinuses.  No acute  findings. Other: No mastoid effusions. IMPRESSION: 1. Acute 1.5 cm intraparenchymal hemorrhage in the posterior left temporal lobe. 2. Evolving infarct in the left frontal lobe. 3. No significant mass effect or midline shift. Findings discussed with Dr. Wilford Corner via telephone at 6:36 p.m. Electronically Signed   By: Juluis Mire.D.  On: 02/10/2023 18:38   EEG adult  Result Date: 02/10/2023 Charlsie Quest, MD     02/10/2023  1:42 PM Patient Name: Zachary [name] MRN: [id number] Epilepsy Attending: Charlsie Quest Referring Physician/Provider: Elmer Picker, NP Date: 02/10/2023 Duration: 36 mins Patient history: 77 y.o. male with GERD, aortic valve stenosis s/p AVR, CAD, HTN, HLD who presents with aphasia. EEG to evaluate for seizure Level of alertness: Awake AEDs during EEG study: None Technical aspects: This EEG study was done with scalp electrodes positioned according to the 10-20 International system of electrode placement. Electrical activity was reviewed with band pass filter of 1-70Hz , sensitivity of 7 uV/mm, display speed of 44mm/sec with a 60Hz  notched filter applied as appropriate. EEG data were recorded continuously and digitally stored.  Video monitoring was available and reviewed as appropriate. Description: The posterior dominant rhythm consists of 7.5Hz  activity of moderate voltage (25-35 uV) seen predominantly in posterior head regions, symmetric and reactive to eye opening and eye closing. EEG showed intermittent 3 to 5 Hz theta-delta slowing in left fronto-temporal region. Sharp waves were noted in left fronto-temporal region. Hyperventilation and photic stimulation were not performed.   ABNORMALITY -Sharp wave,  left fronto-temporal region - Intermittent slow,  left fronto-temporal region IMPRESSION: This study evidence of potential epileptogenicity and cortical dysfunction arising from left fronto-temporal region. No seizures were seen throughout the recording. Charlsie Quest   MR  BRAIN WO CONTRAST  Result Date: 02/09/2023 CLINICAL DATA:  Aphasia, stroke suspected EXAM: MRI HEAD WITHOUT CONTRAST TECHNIQUE: Multiplanar, multiecho pulse sequences of the brain and surrounding structures were obtained without intravenous contrast. COMPARISON:  No prior MRI available, correlation is made with 02/09/2023 CT head FINDINGS: Brain: Cortical restricted diffusion with ADC correlate in the left frontal lobe, including the operculum (series 7, image 61 and series 5, images 81-92). Minimal associated T2 hyperintense signal, suggestive of hyperacute infarct. No acute hemorrhage, mass, mass effect, or midline shift. No hydrocephalus or extra-axial collection. Normal pituitary and craniocervical junction. 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 Remote lacunar infarcts in the left caudate head and bilateral cerebellar hemispheres. Scattered T2 hyperintense signal in the periventricular white matter, likely the sequela of chronic small vessel ischemic disease. Vascular: Normal arterial flow voids. Skull and upper cervical spine: Normal marrow signal. Sinuses/Orbits: Clear paranasal sinuses. No acute finding in the orbits. Status post bilateral lens replacements. Other: The mastoid air cells are well aerated. Incidental note is made susceptibility artifact that appears centered on the temporal bones, which correlates with metallic foci within the external auditory canals on the same-day CT. IMPRESSION: 1. Cortical restricted diffusion with ADC correlate in the left frontal lobe, including the operculum, with minimal associated T2 hyperintense signal, suggestive of hyperacute infarct. No evidence of hemorrhagic transformation or significant mass effect. 2. Incidental note is made susceptibility artifact that appears centered on the temporal bones, which correlates with metallic foci within the external  auditory canals on the same-day CT. Correlate with physical exam. Imaging results were communicated on 02/09/2023 at 9:34 pm to provider Dr. Derry Lory via secure text paging. Electronically Signed   By: Wiliam Ke M.D.   On: 02/09/2023 21:35   CT ANGIO HEAD NECK W WO CM (CODE STROKE)  Result Date: 02/09/2023 CLINICAL DATA:  Global aphasia EXAM: CT ANGIOGRAPHY HEAD AND NECK WITH AND WITHOUT CONTRAST TECHNIQUE: Multidetector CT imaging of the head and neck was performed using the standard protocol during bolus  administration of intravenous contrast. Multiplanar CT image reconstructions and MIPs were obtained to evaluate the vascular anatomy. Carotid stenosis measurements (when applicable) are obtained utilizing NASCET criteria, using the distal internal carotid diameter as the denominator. RADIATION DOSE REDUCTION: This exam was performed according to the departmental dose-optimization program which includes automated exposure control, adjustment of the mA and/or kV according to patient size and/or use of iterative reconstruction technique. CONTRAST:  75 mL Omnipaque 350 COMPARISON:  No prior CTA available, correlation is made with CT head 02/09/2023 FINDINGS: CT HEAD FINDINGS For noncontrast findings, please see same day CT head. CTA NECK FINDINGS Aortic arch: Standard branching. Imaged portion shows no evidence of aneurysm or dissection. No significant stenosis of the major arch vessel origins. Right carotid system: No evidence of dissection, occlusion, or hemodynamically significant stenosis (greater than 50%). Left carotid system: No evidence of dissection, occlusion, or hemodynamically significant stenosis (greater than 50%). Vertebral arteries: No evidence of dissection, occlusion, or hemodynamically significant stenosis (greater than 50%). Skeleton: No acute osseous abnormality. Degenerative changes in the cervical spine. Other neck: No acute finding. Upper chest: No focal pulmonary opacity or pleural  effusion. Review of the MIP images confirms the above findings CTA HEAD FINDINGS Anterior circulation: Both internal carotid arteries are patent to the termini, without significant stenosis. A1 segments patent. Normal anterior communicating artery. Anterior cerebral arteries are patent to their distal aspects without significant stenosis. No M1 stenosis or occlusion. MCA branches perfused to their distal aspects without significant stenosis. Posterior circulation: Vertebral arteries patent to the vertebrobasilar junction without significant stenosis. Posterior inferior cerebellar arteries patent proximally. Basilar patent to its distal aspect without significant stenosis. Superior cerebellar arteries patent proximally. Patent right P1. Fetal origin of the left PCA from the left posterior communicating artery. The right posterior communicating artery is also patent. PCAs perfused to their distal aspects without significant stenosis. Venous sinuses: As permitted by contrast timing, patent. Anatomic variants: Fetal origin of the left PCA. No evidence of aneurysm or vascular malformation. Review of the MIP images confirms the above findings IMPRESSION: 1. No intracranial large vessel occlusion or significant stenosis. 2. No hemodynamically significant stenosis in the neck. Electronically Signed   By: Wiliam Ke M.D.   On: 02/09/2023 19:46   CT HEAD CODE STROKE WO CONTRAST  Result Date: 02/09/2023 CLINICAL DATA:  Code stroke.  Aphasia EXAM: CT HEAD WITHOUT CONTRAST TECHNIQUE: Contiguous axial images were obtained from the base of the skull through the vertex without intravenous contrast. RADIATION DOSE REDUCTION: This exam was performed according to the departmental dose-optimization program which includes automated exposure control, adjustment of the mA and/or kV according to patient size and/or use of iterative reconstruction technique. COMPARISON:  None Available. FINDINGS: Brain: No evidence of acute  infarction, hemorrhage, mass, mass effect, or midline shift. No hydrocephalus or extra-axial collection. Periventricular white matter changes, likely the sequela of chronic small vessel ischemic disease. Vascular: No hyperdense vessel. Skull: Negative for fracture or focal lesion. Sinuses/Orbits: Mild mucosal thickening in the left maxillary sinus. No acute finding in the orbits. Status post bilateral lens replacements. Other: The mastoid air cells are well aerated. ASPECTS [facility] Stroke Program Early CT Score) - Ganglionic level infarction (caudate, lentiform nuclei, internal capsule, insula, M1-M3 cortex): 7 - Supraganglionic infarction (M4-M6 cortex): 3 Total score (0-10 with 10 being normal): 10 IMPRESSION: No evidence of acute intracranial process. ASPECTS is 10. Imaging results were communicated on 02/09/2023 at 7:33 pm to provider Dr. Derry Lory via secure text paging. Electronically Signed  By: Wiliam Ke M.D.   On: 02/09/2023 19:36       HISTORY OF PRESENT ILLNESS 77 y.o. patient with history of GERD, aortic valve stenosis status post replacement, CAD, hypertension and hyperlipidemia was admitted with aphasia  HOSPITAL COURSE Patient received TNK to treat his stroke on 10/18, and on 10/19 he was found to have a small hemorrhage in the left temporal lobe.  This remained stable on CT on 10/20, and blood pressures were adequately controlled and neurological exam did not change.  Patient appeared to be at risk for sleep apnea and patient's wife signed informed consent form.  Patient went overnight NOx 3 monitor screening and tested positive for sleep apnea.  However he was reluctant and unable to tolerate CPAP mask hence he ended up being a screen failure in the sleep smart study and patient will need repeat CT 1 week after discharge to assess progress of this.  EEG was negative for seizure activity, and 2D echo showed mild dilation of left atrium.  Patient received loop recorder implantation prior  to discharge.  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 ASPECTS 10.    CTA head & neck No LVO MRI  Cortical restricted diffusion with ADC correlate in the left frontal lobe, including the operculum, with minimal associated T2 hyperintense signal, suggestive of hyperacute infarct. No evidence of hemorrhagic transformation or significant mass effect. 24 hour CT Head- Acute 1.5 cm intraparenchymal hemorrhage in the posterior left temporal lobe. Evolving infarct in the left frontal lobe. 10/20 CT Head- No progression of ischemia and parenchymal hemorrhage when compared to yesterday. 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, advised to drink no more than 2 drink(s) a day Obesity, Body mass index is 29.76 kg/m., BMI >/= 30 associated with increased stroke risk, recommend weight loss, diet and exercise as appropriate  Aortic stenosis s/p AVR Coronary artery disease   Other Active Problems AKI vs CKD Cr 1.45 -> 1.60 Baseline appears to be around 1.23-1.40  RN Pressure Injury Documentation:     DISCHARGE EXAM  PHYSICAL EXAM General:  Alert, well-nourished, well-developed patient in no acute distress Psych:  Mood and affect appropriate for situation CV: Regular rate and rhythm on monitor Respiratory:  Regular, unlabored respirations on room air GI: Abdomen  soft and nontender  NEURO:  Mental Status: AA&Ox3  Speech/Language: speech is without significant expressive aphasia and some receptive aphasia.  Patient is unable to name objects but can state yes or no to answer questions.  He is able to follow single step commands but has some difficulty with two-step commands.  Cranial Nerves:  II: PERRL. III, IV, VI: EOMI. Eyelids elevate symmetrically.  V: Sensation is intact to light touch and symmetrical to face.  VII: Smile is symmetrical.  VIII: hearing intact to voice. IX, X: Phonation is normal.  XB:[id number] shrug 5/5. XII: tongue is  midline without fasciculations. Motor: 5/5 strength to all muscle groups tested.  Tone: is normal and bulk is normal Sensation- Intact to light touch bilaterally.  Coordination: FTN intact bilaterally.No drift.  Gait- deferred   Discharge Diet       Diet   Diet Heart Room service appropriate? Yes; Fluid consistency: Thin   liquids  DISCHARGE PLAN Disposition: Home No antithrombotic for secondary stroke prevention due to hemorrhage, will obtain follow-up CT in 7 days and then prescribe Plavix if hemorrhage remained stable Ongoing stroke risk factor control by Primary Care Physician at time of discharge Follow-up PCP Merri Brunette, MD in 2 weeks. Head CT in 7 days Follow-up in [facility] in 8 weeks, office to schedule an appointment.  Will refer to's [facility] sleep clinic for sleep apnea management  45 minutes were spent preparing discharge.  Cortney E Ernestina Columbia , MSN, AGACNP-BC [facility] See Amion for schedule and pager information 02/12/2023 1:27 PM  I have personally obtained history,examined this patient, reviewed notes, independently viewed imaging studies, participated in medical decision making and plan of care.ROS completed by me personally and pertinent positives fully documented  I have made any additions or clarifications directly to the  above note. Agree with note above.    Delia Heady, MD Medical Director [facility] Pager: [phone] 02/12/2023 2:34 PM

## 2023-02-12 NOTE — Discharge Instructions (Addendum)
Zachary [name], you were admitted to the hospital with difficulty speaking due to a stroke.  You were given TNK to treat this and were found to have a small left temporal hemorrhage afterwards.  This was stable on a repeat CT scan, but you will need to obtain another head CT one week after discharge to continue to monitor this.  We will give you a prescription for Plavix at that time if it continues to be stable.  You will have home health OT and speech therapy.  Please come to the stroke clinic 8 weeks after discharge for a follow up appointment.  Care After Your Loop Recorder  You have a Medtronic Loop Recorder   Monitor your cardiac device site for redness, swelling, and drainage. Call the device clinic at [phone] if you experience these symptoms or fever/chills.  If you notice bleeding from your site, hold firm, but gently pressure with two fingers for 5 minutes. Dried blood on the steri-strips when removing the outer bandage is normal.   Keep the large square bandage on your site for 24 hours and then you may remove it yourself. Keep the steri-strips underneath in place.   You may shower after 72 hours / 3 days from your procedure with the steri-strips in place. They will usually fall off on their own, or may be removed after 10 days. Pat dry.   Avoid lotions, ointments, or perfumes over your incision until it is well-healed.  Please do not submerge in water until your site is completely healed.   Your device is MRI compatible.   Remote monitoring is used to monitor your cardiac device from home. This monitoring is scheduled every month by our office. It allows [place] to keep an eye on the function of your device to ensure it is working properly.

## 2023-02-12 NOTE — Evaluation (Signed)
Physical Therapy Evaluation/ Discharge Patient Details Name: Zachary [name] MRN: [id number] DOB: Sep 21, 1945 Today's Date: 02/12/2023  History of Present Illness  77 y.o. male admitted 10/18 with aphasia and Lt MCA CVA s/p TNK. 10/19 24hr CT with small lt temporal bleed. PMhx: GERD, AVR, CAD, HTN, HLD  Clinical Impression  Pt pleasant with expressive and receptive aphasia deficits limiting his ability to follow command and communicate thoughts. Pt with good motor function and balance who does not require futher P.T. intervention and discussed with family. Pt needing additional cues to follow multistep commands and family aware of need of supervision for safety, medication management, cooking and being able to call 911. Family in agreement and will defer needs to OT, SLP. ENcouraged ambulation and mobility acutely. Will sign off.         If plan is discharge home, recommend the following: Assistance with cooking/housework;Direct supervision/assist for financial management;Direct supervision/assist for medications management;Supervision due to cognitive status   Can travel by private vehicle        Equipment Recommendations None recommended by PT  Recommendations for Other Services  Speech consult    Functional Status Assessment Patient has not had a recent decline in their functional status     Precautions / Restrictions Precautions Precautions: Fall      Mobility  Bed Mobility Overal bed mobility: Independent                  Transfers Overall transfer level: Modified independent                      Ambulation/Gait Ambulation/Gait assistance: Supervision Gait Distance (Feet): 500 Feet Assistive device: None Gait Pattern/deviations: WFL(Within Functional Limits)   Gait velocity interpretation: >4.37 ft/sec, indicative of normal walking speed   General Gait Details: pt able to complete head turns, change of direction and change of speed. Pt needing  repetition of cues as confusing right/left  Stairs Stairs: Yes Stairs assistance: Modified independent (Device/Increase time) Stair Management: Alternating pattern, Forwards Number of Stairs: 11    Wheelchair Mobility     Tilt Bed    Modified Rankin (Stroke Patients Only)       Balance Overall balance assessment: No apparent balance deficits (not formally assessed)                                           Pertinent Vitals/Pain Pain Assessment Pain Assessment: No/denies pain    Home Living Family/patient expects to be discharged to:: Private residence Living Arrangements: Spouse/significant other Available Help at Discharge: Family;Available 24 hours/day Type of Home: House Home Access: Stairs to enter Entrance Stairs-Rails: Right Entrance Stairs-Number of Steps: 2   Home Layout: One level Home Equipment: Shower seat      Prior Function Prior Level of Function : Independent/Modified Independent;Driving             Mobility Comments: No AD, working on his [address]k       Extremity/Trunk Assessment   Upper Extremity Assessment Upper Extremity Assessment: Overall WFL for tasks assessed    Lower Extremity Assessment Lower Extremity Assessment: Overall WFL for tasks assessed    Cervical / Trunk Assessment Cervical / Trunk Assessment: Normal  Communication   Communication Communication: Difficulty following commands/understanding;Difficulty communicating thoughts/reduced clarity of speech Following commands: Follows one step commands consistently Cueing Techniques: Verbal cues  Cognition Arousal: Alert  Behavior During Therapy: WFL for tasks assessed/performed Overall Cognitive Status: Impaired/Different from baseline Area of Impairment: Following commands                       Following Commands: Follows multi-step commands inconsistently       General Comments: pt confusing right/ left with verbal cues and  following 2-3 step commands grossly 40% of the time with no awareness of errors        General Comments      Exercises     Assessment/Plan    PT Assessment Patient does not need any further PT services  PT Problem List         PT Treatment Interventions      PT Goals (Current goals can be found in the Care Plan section)  Acute Rehab PT Goals PT Goal Formulation: All assessment and education complete, DC therapy    Frequency       Co-evaluation               AM-PAC PT "6 Clicks" Mobility  Outcome Measure Help needed turning from your back to your side while in a flat bed without using bedrails?: None Help needed moving from lying on your back to sitting on the side of a flat bed without using bedrails?: None Help needed moving to and from a bed to a chair (including a wheelchair)?: A Little Help needed standing up from a chair using your arms (e.g., wheelchair or bedside chair)?: A Little Help needed to walk in hospital room?: A Little Help needed climbing 3-5 steps with a railing? : A Little 6 Click Score: 20    End of Session   Activity Tolerance: Patient tolerated treatment well Patient left: in bed;with call bell/phone within reach;with family/visitor present Nurse Communication: Mobility status PT Visit Diagnosis: Other abnormalities of gait and mobility (R26.89)    Time: 9147-8295 PT Time Calculation (min) (ACUTE ONLY): 20 min   Charges:   PT Evaluation $PT Eval Low Complexity: 1 Low   PT General Charges $$ ACUTE PT VISIT: 1 Visit         Merryl Hacker, PT Acute Rehabilitation Services Office: [phone]   Enedina Finner Mickaela Starlin 02/12/2023, 10:26 AM

## 2023-02-12 NOTE — TOC Initial Note (Signed)
Transition of Care [facility]) - Initial/Assessment Note    Patient Details  Name: Zachary [name] MRN: [id number] Date of Birth: December 02, 1945  [facility]) CM/SW Contact:    Janae Bridgeman, RN Phone Number: 02/12/2023, 10:58 AM  Clinical Narrative:                 Cm met with the patient and wife at the bedside prior to discharge to offer choice regarding OUtpatient ST needs.  The wife states that she would prefer referral to OUtpatient ST at main [facility] OUtpatient site.  Patient is independent other than ST needs but wife  plans to provide transportation to the therapy by car.  Patient plans to follow up with neurology outpatient and schedule PCP follow up in the next 7-10 days with Dr. Merri Brunette, MD.  Expected Discharge Plan: Home/Self Care     Patient Goals and CMS Choice            Expected Discharge Plan and Services                                              Prior Living Arrangements/Services                       Activities of Daily Living      Permission Sought/Granted                  Emotional Assessment              Admission diagnosis:  Aphasia [R47.01] Acute ischemic stroke [facility]) [I63.9] Patient Active Problem List   Diagnosis Date Noted   Aphasia 02/09/2023   S/P AVR 03/25/2015   CAD (coronary artery disease), native coronary artery 03/10/2015   Hypertensive heart disease 03/06/2013    Class: Chronic   Hyperlipidemia 03/06/2013    Class: Chronic   GERD (gastroesophageal reflux disease) 03/06/2013   PCP:  Merri Brunette, MD Pharmacy:   [facility]) - [place], [place] [address] [facility] [address] [place] [address] [place] [address] Phone: [phone] Fax: [phone]  [id number] [place], [place] - [place] [address] [place] [address] Phone: [phone] Fax: [phone]     Social Determinants of Health (SDOH) Social  History: SDOH Screenings   Tobacco Use: Low Risk  (12/08/2022)   SDOH Interventions:     Readmission Risk Interventions     No data to display

## 2023-02-12 NOTE — Consult Note (Addendum)
ELECTROPHYSIOLOGY CONSULT NOTE  Patient ID: Zachary [name] MRN: [id number], DOB/AGE: 1947/77/27   Admit date: 02/09/2023 Date of Consult: 02/12/2023  Primary Physician: Merri Brunette, MD Primary Cardiologist: Lance Muss, MD  Primary Electrophysiologist: New to Dr. Elberta Fortis Reason for Consultation: Cryptogenic stroke; recommendations regarding Implantable Loop Recorder Insurance: [facility] Medicare  History of Present Illness EP has been asked to evaluate Zachary [name] for placement of an implantable loop recorder to monitor for atrial fibrillation by Dr Pearlean Brownie.  The patient was admitted on 02/09/2023 with aphasia.    Imaging demonstrated Acute Ischemic Infarct:  left frontal lobe infarct s/p TNK .small left temporal post TNK hemorrhage Etiology:  strong suspicion for afib   Other work up includes: Code Stroke CT head No acute abnormality. ASPECTS 10.    CTA head & neck No LVO MRI  Cortical restricted diffusion with ADC correlate in the left frontal lobe, including the operculum, with minimal associated T2 hyperintense signal, suggestive of hyperacute infarct. No evidence of hemorrhagic transformation or significant mass effect. 24 hour CT Head- Acute 1.5 cm intraparenchymal hemorrhage in the posterior left temporal lobe. Evolving infarct in the left frontal lobe. 10/20 CT Head- No progression of ischemia and parenchymal hemorrhage when compared to yesterday. EEG- negative for seizure 2D Echo Left atria is mildly dilated, EF 55-60% LDL 62 HgbA1c 6.0 VTE prophylaxis - SCDs aspirin 81 mg daily prior to admission, no antithrombotic for 24 hours post TNK.    He has previously worn a monitor after stroke in 2022 that did not show any AF.   The patient has been monitored on telemetry which has demonstrated sinus rhythm with no arrhythmias.  Inpatient stroke work-up Kirstan Fentress not require a TEE per Neurology.   Echocardiogram as above. Lab work is reviewed.  Prior to admission, the  patient denies chest pain, shortness of breath, dizziness, palpitations, or syncope.  He is recovering from his stroke with plans to return home  with outpatient Speech therapy at discharge.  Allergies, Past Medical, Surgical, Social, and Family Histories have been reviewed and are referenced here-in when relevant for medical decision making.   Inpatient Medications:   atorvastatin  40 mg Oral Daily   Chlorhexidine Gluconate Cloth  6 each Topical Daily   pantoprazole (PROTONIX) IV  40 mg Intravenous QHS    Physical Exam: Vitals:   02/12/23 0149 02/12/23 0539 02/12/23 0544 02/12/23 0806  BP: 124/74 115/78 119/73 122/78  Pulse: 84 89  84  Resp: 17 18  16   Temp: 97.6 F (36.4 C) 98.1 F (36.7 C) 98.1 F (36.7 C) (!) 97.5 F (36.4 C)  TempSrc: Oral Oral Oral Oral  SpO2: 93% 94%  92%  Weight:      Height:        GEN- NAD. A&O x 3. Normal affect. HEENT: Normocephalic, atraumatic Lungs- CTAB, Normal effort.  Heart- Regular rate and rhythm rate and rhythm. No M/G/R.  Extremities- No peripheral edema. no clubbing or cyanosis Skin- warm and dry, no rash or lesion. Neuro - Some persistent aphasia   12-lead ECG on arrival shows NSR at 86 bpm (personally reviewed) All prior EKG's in EPIC reviewed with no documented atrial fibrillation  Telemetry NSR 80s (personally reviewed)  Assessment and Plan:  1. Cryptogenic stroke The patient presents with cryptogenic stroke.  The patient does not have a TEE planned for this AM.  I spoke at length with the patient about monitoring for afib with an implantable loop recorder.  Risks, benefits, and  alteratives to implantable loop recorder were discussed with the patient today.   At this time, the patient is very clear in their decision to proceed with implantable loop recorder.     Wound care was reviewed with the patient (keep incision clean and dry for 3 days). Please call with questions.   [name], PA-C 02/12/2023 12:13  PM  I have seen and examined this patient with [name].  Agree with above, note added to reflect my findings.  On exam, RRR, no murmurs, lungs clear.  Patient presented to the hospital with cryptogenic stroke. To date, no cause has been found. [name] plan for LINQ monitor to look for atrial fibrillation. Risks and benefits discussed. Risks include but not limited to bleeding and infection. The patient understands the risks and has agreed to the procedure.  [name] [name] MD 02/12/2023 12:47 PM

## 2023-02-12 NOTE — Progress Notes (Signed)
Patient's BP was 119/73. [name], MD was made aware who stated " I am fine with this blood pressure."   Zachary [name]

## 2023-02-12 NOTE — TOC Progression Note (Signed)
Transition of Care [facility]) - Progression Note    Patient Details  Name: Zachary [name] MRN: [id number] Date of Birth: [birth year]  Transition of Care [facility]) CM/SW Contact  Marliss Coots, LCSW Phone Number: 02/12/2023, 11:06 AM  Clinical Narrative:     This CSW and RNCM Luan Pulling introduced themselves and their roles to patient and their family at bedside (wife and both daughters were present). CSW and RNCM confirmed discharge plan and answered questions.   Expected Discharge Plan: Home/Self Care Barriers to Discharge: No Barriers Identified  Expected Discharge Plan and Services In-house Referral: Clinical Social Work     Living arrangements for the past 2 months: Single Family Home                                       Social Determinants of Health (SDOH) Interventions SDOH Screenings   Tobacco Use: Low Risk  (12/08/2022)    Readmission Risk Interventions     No data to display

## 2023-02-12 NOTE — Care Management Important Message (Signed)
Important Message  Patient Details  Name: RHONALD SAELENS MRN: [id number] Date of Birth: 1946/04/11   Important Message Given:  Yes - Medicare IM     Dorena Bodo 02/12/2023, 3:02 PM

## 2023-02-13 ENCOUNTER — Encounter (HOSPITAL_COMMUNITY): Payer: Self-pay | Admitting: Student

## 2023-02-14 ENCOUNTER — Ambulatory Visit: Payer: Medicare Other | Attending: Nurse Practitioner | Admitting: Speech Pathology

## 2023-02-14 ENCOUNTER — Other Ambulatory Visit: Payer: Self-pay

## 2023-02-14 ENCOUNTER — Encounter: Payer: Self-pay | Admitting: Speech Pathology

## 2023-02-14 DIAGNOSIS — I639 Cerebral infarction, unspecified: Secondary | ICD-10-CM | POA: Diagnosis not present

## 2023-02-14 DIAGNOSIS — R4701 Aphasia: Secondary | ICD-10-CM | POA: Insufficient documentation

## 2023-02-14 NOTE — Patient Instructions (Signed)
  Practice writing and saying each name twice a day Family  Kids:   Grandkids:    Siblings:

## 2023-02-14 NOTE — Therapy (Signed)
OUTPATIENT SPEECH LANGUAGE PATHOLOGY APHASIA EVALUATION   Patient Name: Zachary [name] MRN: [id number] DOB:Oct 14, 1945, 77 y.o., male Today's Date: 02/14/2023  PCP: Ewing Schlein, MD REFERRING PROVIDER: Loney Loh, NP  END OF SESSION:  End of Session - 02/14/23 1527     Visit Number 1    Number of Visits 25    Date for SLP Re-Evaluation 05/09/23    SLP Start Time 1400    SLP Stop Time  1445    SLP Time Calculation (min) 45 min    Activity Tolerance Patient tolerated treatment well             Past Medical History:  Diagnosis Date   Aortic valvar stenosis    Arthritis    Back pain    Erectile dysfunction    GERD (gastroesophageal reflux disease)    HTN (hypertension)    Hypercholesteremia    Urinary frequency    Past Surgical History:  Procedure Laterality Date   AORTIC VALVE REPLACEMENT N/A 03/25/2015   Procedure: AORTIC VALVE REPLACEMENT (AVR);  Surgeon: Alleen Borne, MD;  Location: [facility] OR;  Service: Open Heart Surgery;  Laterality: N/A;   CARDIAC CATHETERIZATION N/A 03/10/2015   Procedure: Right/Left Heart Cath and Coronary Angiography;  Surgeon: Lyn Records, MD;  Location: [facility] INVASIVE CV LAB;  Service: Cardiovascular;  Laterality: N/A;   CARDIAC CATHETERIZATION N/A 03/10/2015   Procedure: Intravascular Pressure Wire/FFR Study;  Surgeon: Lyn Records, MD;  Location: [facility] INVASIVE CV LAB;  Service: Cardiovascular;  Laterality: N/A;   LOOP RECORDER INSERTION N/A 02/12/2023   Procedure: LOOP RECORDER INSERTION;  Surgeon: [name], PA-C;  Location: [facility] INVASIVE CV LAB;  Service: Cardiovascular;  Laterality: N/A;   LUMBAR DISC SURGERY     TEE WITHOUT CARDIOVERSION N/A 03/25/2015   Procedure: TRANSESOPHAGEAL ECHOCARDIOGRAM (TEE);  Surgeon: Alleen Borne, MD;  Location: [facility] OR;  Service: Open Heart Surgery;  Laterality: N/A;   TONSILLECTOMY     Patient Active Problem List   Diagnosis Date Noted   Aphasia 02/09/2023   S/P AVR 03/25/2015    CAD (coronary artery disease), native coronary artery 03/10/2015   Hypertensive heart disease 03/06/2013    Class: Chronic   Hyperlipidemia 03/06/2013    Class: Chronic   GERD (gastroesophageal reflux disease) 03/06/2013    ONSET DATE: 02/09/23   REFERRING DIAG: R47.01 (ICD-10-CM) - Aphasia I63.9 (ICD-10-CM) - Acute ischemic stroke (HCC)  THERAPY DIAG:  Aphasia  Rationale for Evaluation and Treatment: Rehabilitation  SUBJECTIVE:   SUBJECTIVE STATEMENT: "I don't know" Pt accompanied by: significant other and family member Spouse, [name]; daughter, Marcelino Duster  PERTINENT HISTORY: Patient is a 77 y.o. male with PMH: is a 77 y.o. male GERD, aortic valve stenosis s/p AVR, CAD, HTN, HLD. He presented to the ED via EMS from home on 02/09/23 as a code stroke after his spouse returned home in the afternoon to find him mostly mute and unable to comprehend speech or communicate. Initial CT head was negative for acute hemorrhage or mass, MRI brain was suggestive of hyperacute infarct in the left frontal lobe including the operculum.  PAIN:  Are you having pain? No  FALLS: Has patient fallen in last 6 months?  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  DIAGNOSTIC FINDINGS:   COGNITION: Overall cognitive status: Within functional limits for tasks assessed Areas of impairment:  N/A Functional deficits:   AUDITORY COMPREHENSION: Overall auditory comprehension: Impaired: simple YES/NO questions: Impaired: simple Following directions: Impaired: simple Conversation: Simple Interfering components: None Effective technique: extra processing time, repetition/stressing words, slowed speech, and written  cues  READING COMPREHENSION: Impaired: word and phrase  EXPRESSION: verbal  VERBAL EXPRESSION: Level of generative/spontaneous verbalization: word Automatic speech: name: intact and social response: intact  Repetition: Impaired: sentence Naming: Confrontation: 0-25% and Divergent: 0-25% Pragmatics: Appears intact Comments:  Interfering components:  none Effective technique: sentence completion, phonemic cues, and written cues Non-verbal means of communication: N/A Named 1/2 daughters; named 2/5 grandchildren independently; named 1/3 siblings  WRITTEN EXPRESSION: Dominant hand: right Written expression: Impaired: word Impaired address and family names  MOTOR SPEECH: Overall motor speech: Appears intact Level of impairment: Phrase Respiration: thoracic breathing Phonation: normal Resonance: WFL Articulation: Appears intact Intelligibility: Intelligible Motor planning: Appears intact  ORAL MOTOR EXAMINATION: Overall status: WFL Comments:   STANDARDIZED ASSESSMENTS: 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  PATIENT REPORTED OUTCOME MEASURES (PROM): Communication Participation Item Bank: 0/30 - 0 for all items - rated by spouse, [name]   TODAY'S TREATMENT:                                                                                                                                         DATE:   02/14/23 (eval day): aphasia ed; review homework (write family names and locations) and categorization homework for reading and writing   PATIENT EDUCATION: Education details: See Today's Treatment; See Patient Instructions; compensations for aphasia Person educated: Patient, Spouse, and Child(ren) Education method: Explanation, Demonstration, and Verbal cues Education comprehension: verbalized understanding, returned demonstration, verbal cues required, and needs further education   GOALS: Goals reviewed with patient? Yes  SHORT TERM GOALS: Target date: 03/14/23  Pt will name 8 items in 3  personally relevant categories with usual min A Baseline: Goal status: INITIAL  2.  Pt will name and write 8 family members with occasional min A Baseline:  Goal status: INITIAL  3.  Pt will generate 2-3 verbal compensations for aphasia in structured task such as Semantic Feature Analysis (SFA) with usual min to mod A Baseline:  Goal status: INITIAL  4.  Pt will comprehend simple social texts with occasional min A over 1 week Baseline:  Goal status: INITIAL  5.  Pt will use multimodal communication to answer personally relevant questions or take 2 turns in conversation with occasional min a Baseline:  Goal status: INITIAL    LONG TERM GOALS: Target date: 04/11/23  Pt will generate 12 items in personally relevant categories with occasional min A Baseline:  Goal status: INITIAL  2.  Pt will generate moderately complex sentences in structured task such as Scientist, product/process development (VNeST) with occasional min A Baseline:  Goal status: INITIAL  3.  Pt will respond to text with simple word/phrases 3x over 1 week  occasional min A Baseline:  Goal status: INITIAL  4.  Pt will ID and correct paraphasias in structured tasks with occasional min A Baseline:  Goal status: INITIAL  5.  Pt will ID and correct aphasic errors in written expression with occasional min A Baseline:  Goal status: INITIAL  6.  Pt will improve score on Communication Participation Item Bank by 4 points Baseline: 0;  Goal status: INITIAL  ASSESSMENT:  CLINICAL IMPRESSION: Patient is a 77 y.o. 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 He is unable to comprehend short texts nor respond to texts due to aphasia. In personally relevant category (car parts) Kannin did  not name any items. 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 He was unable to name family members. I recommend skilled ST to maximize communication for safety, independence and to reduce caregiver burden.   OBJECTIVE IMPAIRMENTS: include aphasia. These impairments are limiting patient from return to work, household responsibilities, ADLs/IADLs, and effectively communicating at home and in community. Factors affecting potential to achieve goals and functional outcome are severity of impairments. Patient will benefit from skilled SLP services to address above impairments and improve overall function.  REHAB POTENTIAL: Good  PLAN:  SLP FREQUENCY: 2x/week  SLP DURATION: 12 weeks  PLANNED INTERVENTIONS: 92507 Treatment of speech (30 or 45 min)     Callia Swim, Radene Journey, CCC-SLP 02/14/2023, 3:27 PM

## 2023-02-15 ENCOUNTER — Telehealth: Payer: Self-pay

## 2023-02-15 NOTE — Telephone Encounter (Signed)
  Loop Recorder Follow up   Is patient connected to [facility]?  No, patient date is not populated in [employer] until tomorrow 02/16/23.  Have steri-strips fallen off or been removed? No   Does the patient need in office follow up? No   Please continue to monitor your cardiac device site for redness, swelling, and drainage. Call the device clinic at [phone] if you experience these symptoms, fever/chills, or have questions about your device.   Remote monitoring is used to monitor your cardiac device from home. This monitoring is scheduled every month by our office. It allows [place] to keep an eye on the functioning of your device to ensure it is working properly.

## 2023-02-15 NOTE — Telephone Encounter (Signed)
-----   Message from Mariam Dollar Tillery sent at 02/12/2023  1:26 PM EDT ----- Regarding: Same Day Discharge LOOP 02/12/23 Dr. Elberta Fortis to follow ( I implanted)

## 2023-02-16 ENCOUNTER — Encounter: Payer: Self-pay | Admitting: Neurology

## 2023-02-16 DIAGNOSIS — Z8673 Personal history of transient ischemic attack (TIA), and cerebral infarction without residual deficits: Secondary | ICD-10-CM | POA: Diagnosis not present

## 2023-02-16 DIAGNOSIS — T162XXA Foreign body in left ear, initial encounter: Secondary | ICD-10-CM | POA: Diagnosis not present

## 2023-02-16 DIAGNOSIS — H6123 Impacted cerumen, bilateral: Secondary | ICD-10-CM | POA: Diagnosis not present

## 2023-02-16 NOTE — Telephone Encounter (Signed)
Pt is now on a remote schedule.

## 2023-02-19 ENCOUNTER — Telehealth: Payer: Self-pay | Admitting: Neurology

## 2023-02-19 ENCOUNTER — Ambulatory Visit: Payer: Medicare Other | Admitting: Speech Pathology

## 2023-02-19 ENCOUNTER — Encounter: Payer: Self-pay | Admitting: Speech Pathology

## 2023-02-19 DIAGNOSIS — R4701 Aphasia: Secondary | ICD-10-CM

## 2023-02-19 DIAGNOSIS — I639 Cerebral infarction, unspecified: Secondary | ICD-10-CM | POA: Diagnosis not present

## 2023-02-19 NOTE — Telephone Encounter (Signed)
Pt. Wife came in (on [place]) wants to know if her husband needs a CT Scan. Was released from hospital 02/12/23

## 2023-02-19 NOTE — Therapy (Signed)
OUTPATIENT SPEECH LANGUAGE PATHOLOGY APHASIA EVALUATION   Patient Name: Zachary [name] MRN: [id number] DOB:1946/04/22, 77 y.o., male Today's Date: 02/19/2023  PCP: Ewing Schlein, MD REFERRING PROVIDER: Loney Loh, NP  END OF SESSION:  End of Session - 02/19/23 1452     Visit Number 2    Number of Visits 25    Date for SLP Re-Evaluation 05/09/23    SLP Start Time 1445    SLP Stop Time  1530    SLP Time Calculation (min) 45 min    Activity Tolerance Patient tolerated treatment well             Past Medical History:  Diagnosis Date   Aortic valvar stenosis    Arthritis    Back pain    Erectile dysfunction    GERD (gastroesophageal reflux disease)    HTN (hypertension)    Hypercholesteremia    Urinary frequency    Past Surgical History:  Procedure Laterality Date   AORTIC VALVE REPLACEMENT N/A 03/25/2015   Procedure: AORTIC VALVE REPLACEMENT (AVR);  Surgeon: Alleen Borne, MD;  Location: [facility] OR;  Service: Open Heart Surgery;  Laterality: N/A;   CARDIAC CATHETERIZATION N/A 03/10/2015   Procedure: Right/Left Heart Cath and Coronary Angiography;  Surgeon: Lyn Records, MD;  Location: [facility] INVASIVE CV LAB;  Service: Cardiovascular;  Laterality: N/A;   CARDIAC CATHETERIZATION N/A 03/10/2015   Procedure: Intravascular Pressure Wire/FFR Study;  Surgeon: Lyn Records, MD;  Location: [facility] INVASIVE CV LAB;  Service: Cardiovascular;  Laterality: N/A;   LOOP RECORDER INSERTION N/A 02/12/2023   Procedure: LOOP RECORDER INSERTION;  Surgeon: [name], PA-C;  Location: [facility] INVASIVE CV LAB;  Service: Cardiovascular;  Laterality: N/A;   LUMBAR DISC SURGERY     TEE WITHOUT CARDIOVERSION N/A 03/25/2015   Procedure: TRANSESOPHAGEAL ECHOCARDIOGRAM (TEE);  Surgeon: Alleen Borne, MD;  Location: [facility] OR;  Service: Open Heart Surgery;  Laterality: N/A;   TONSILLECTOMY     Patient Active Problem List   Diagnosis Date Noted   Aphasia 02/09/2023   S/P AVR 03/25/2015    CAD (coronary artery disease), native coronary artery 03/10/2015   Hypertensive heart disease 03/06/2013    Class: Chronic   Hyperlipidemia 03/06/2013    Class: Chronic   GERD (gastroesophageal reflux disease) 03/06/2013    ONSET DATE: 02/09/23   REFERRING DIAG: R47.01 (ICD-10-CM) - Aphasia I63.9 (ICD-10-CM) - Acute ischemic stroke (HCC)  THERAPY DIAG:  Aphasia  Rationale for Evaluation and Treatment: Rehabilitation  SUBJECTIVE:   SUBJECTIVE STATEMENT: "I don't know" Pt accompanied by: significant other and family member Spouse, Zachary [name]; daughter, Zachary [name]  PERTINENT HISTORY: Patient is a 77 y.o. male with PMH: is a 77 y.o. male GERD, aortic valve stenosis s/p AVR, CAD, HTN, HLD. He presented to the ED via EMS from home on 02/09/23 as a code stroke after his spouse returned home in the afternoon to find him mostly mute and unable to comprehend speech or communicate. Initial CT head was negative for acute hemorrhage or mass, MRI brain was suggestive of hyperacute infarct in the left frontal lobe including the operculum.  PAIN:  Are you having pain? No  FALLS: Has patient fallen in last 6 months?  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  DIAGNOSTIC FINDINGS:   COGNITION: Overall cognitive status: Within functional limits for tasks assessed Areas of impairment:  N/A Functional deficits:   AUDITORY COMPREHENSION: Overall auditory comprehension: Impaired: simple YES/NO questions: Impaired: simple Following directions: Impaired: simple Conversation: Simple Interfering components: None Effective technique: extra processing time, repetition/stressing words, slowed speech, and written  cues  READING COMPREHENSION: Impaired: word and phrase  EXPRESSION: verbal  VERBAL EXPRESSION: Level of generative/spontaneous verbalization: word Automatic speech: name: intact and social response: intact  Repetition: Impaired: sentence Naming: Confrontation: 0-25% and Divergent: 0-25% Pragmatics: Appears intact Comments:  Interfering components:  none Effective technique: sentence completion, phonemic cues, and written cues Non-verbal means of communication: N/A Named 1/2 daughters; named 2/5 grandchildren independently; named 1/3 siblings  WRITTEN EXPRESSION: Dominant hand: right Written expression: Impaired: word Impaired address and family names  MOTOR SPEECH: Overall motor speech: Appears intact Level of impairment: Phrase Respiration: thoracic breathing Phonation: normal Resonance: WFL Articulation: Appears intact Intelligibility: Intelligible Motor planning: Appears intact  ORAL MOTOR EXAMINATION: Overall status: WFL Comments:   STANDARDIZED ASSESSMENTS: 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 Zachary [name] wrote his 2 daughters' names with supervision cues and named his 5 siblings with extended pauses, but mod I. With frequent mod written (fill in blank) and semantic/phonemic cues, Zachary [name] named 10 car parts and 5 tools with extended time. He wrote the car parts with consistent max copying cues for spelling. Frequent mod A to ID aphasic errors writing,as well as errors copying. Provided aphasia ID card today.   02/14/23 (eval day): aphasia ed; review homework (write family names and locations) and categorization homework  for reading and writing   PATIENT EDUCATION: Education details: See Today's Treatment; See Patient Instructions; compensations for aphasia Person educated: Patient, Spouse, and Child(ren) Education method: Explanation, Demonstration, and Verbal cues Education comprehension: verbalized understanding, returned demonstration, verbal cues required, and needs further education   GOALS: Goals reviewed with patient? Yes  SHORT TERM GOALS: Target date: 03/14/23  Pt will name 8 items in 3 personally relevant categories with usual min A Baseline: Goal status: INITIAL  2.  Pt will name and write 8 family members with occasional min A Baseline:  Goal status: INITIAL  3.  Pt will generate 2-3 verbal compensations for aphasia in structured task such as Semantic Feature Analysis (SFA) with usual min to mod A Baseline:  Goal status: INITIAL  4.  Pt will comprehend simple social texts with occasional min A over 1 week  Baseline:  Goal status: INITIAL  5.  Pt will use multimodal communication to answer personally relevant questions or take 2 turns in conversation with occasional min a Baseline:  Goal status: INITIAL    LONG TERM GOALS: Target date: 04/11/23  Pt will generate 12 items in personally relevant categories with occasional min A Baseline:  Goal status: INITIAL  2.  Pt will generate moderately complex sentences in structured task such as Scientist, product/process development (VNeST) with occasional min A Baseline:  Goal status: INITIAL  3.  Pt will respond to text with simple word/phrases 3x over 1 week  occasional min A Baseline:  Goal status: INITIAL  4.  Pt will ID and correct paraphasias in structured tasks with occasional min A Baseline:  Goal status: INITIAL  5.  Pt will ID and correct aphasic errors in written expression with occasional min A Baseline:  Goal status: INITIAL  6.  Pt will improve score on Communication Participation Item Bank by 4 points Baseline:  0;  Goal status: INITIAL  ASSESSMENT:  CLINICAL IMPRESSION: Patient is a 77 y.o. 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 He is unable to comprehend short texts nor respond to texts due to aphasia. In personally relevant category (car parts) Zachary [name] did not name any items. 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 He was unable to name family members. I recommend skilled ST to maximize communication for safety, independence and to reduce caregiver burden.   OBJECTIVE IMPAIRMENTS: include aphasia. These impairments are limiting patient from return to work, household responsibilities, ADLs/IADLs, and effectively communicating at home and in community. Factors affecting potential to achieve goals and functional outcome are severity of impairments. Patient will benefit from skilled SLP services to address above impairments and improve overall function.  REHAB POTENTIAL: Good  PLAN:  SLP FREQUENCY: 2x/week  SLP DURATION: 12 weeks  PLANNED INTERVENTIONS: 92507 Treatment of speech (30 or 45 min)     Chontel Warning, Radene Journey, CCC-SLP 02/19/2023, 3:45 PM

## 2023-02-19 NOTE — Patient Instructions (Signed)
   Take pictures of your tools  Practice saying and writing the words  When you go to auto supply store - have what you want written down to help you communicate  Then practice what you are going to say  Write down the parts you are looking for - bring this in

## 2023-02-19 NOTE — Telephone Encounter (Signed)
There is a CT scan that was ordered by NP from rehab it appears and I am asking out MRI coordinator to check and see if that order is valid for the pt to call and get scheduled at [facility] imaging or do we need to enter a new order in. Awaiting response.

## 2023-02-21 ENCOUNTER — Other Ambulatory Visit: Payer: Medicare Other

## 2023-02-21 ENCOUNTER — Ambulatory Visit: Payer: Medicare Other | Admitting: Speech Pathology

## 2023-02-21 ENCOUNTER — Encounter: Payer: Self-pay | Admitting: Speech Pathology

## 2023-02-21 DIAGNOSIS — R4701 Aphasia: Secondary | ICD-10-CM

## 2023-02-21 DIAGNOSIS — I639 Cerebral infarction, unspecified: Secondary | ICD-10-CM | POA: Diagnosis not present

## 2023-02-21 NOTE — Therapy (Signed)
OUTPATIENT SPEECH LANGUAGE PATHOLOGY APHASIA TREATMENT   Patient Name: Zachary [name] MRN: [id number] DOB:Jul 07, 1945, 77 y.o., male Today's Date: 02/21/2023  PCP: Ewing Schlein, MD REFERRING PROVIDER: Loney Loh, NP  END OF SESSION:  End of Session - 02/21/23 1452     Visit Number 3    Number of Visits 25    Date for SLP Re-Evaluation 05/09/23    SLP Start Time 1445    SLP Stop Time  1530    SLP Time Calculation (min) 45 min    Activity Tolerance Patient tolerated treatment well             Past Medical History:  Diagnosis Date   Aortic valvar stenosis    Arthritis    Back pain    Erectile dysfunction    GERD (gastroesophageal reflux disease)    HTN (hypertension)    Hypercholesteremia    Urinary frequency    Past Surgical History:  Procedure Laterality Date   AORTIC VALVE REPLACEMENT N/A 03/25/2015   Procedure: AORTIC VALVE REPLACEMENT (AVR);  Surgeon: Alleen Borne, MD;  Location: [facility] OR;  Service: Open Heart Surgery;  Laterality: N/A;   CARDIAC CATHETERIZATION N/A 03/10/2015   Procedure: Right/Left Heart Cath and Coronary Angiography;  Surgeon: Lyn Records, MD;  Location: [facility] INVASIVE CV LAB;  Service: Cardiovascular;  Laterality: N/A;   CARDIAC CATHETERIZATION N/A 03/10/2015   Procedure: Intravascular Pressure Wire/FFR Study;  Surgeon: Lyn Records, MD;  Location: [facility] INVASIVE CV LAB;  Service: Cardiovascular;  Laterality: N/A;   LOOP RECORDER INSERTION N/A 02/12/2023   Procedure: LOOP RECORDER INSERTION;  Surgeon: [name], PA-C;  Location: [facility] INVASIVE CV LAB;  Service: Cardiovascular;  Laterality: N/A;   LUMBAR DISC SURGERY     TEE WITHOUT CARDIOVERSION N/A 03/25/2015   Procedure: TRANSESOPHAGEAL ECHOCARDIOGRAM (TEE);  Surgeon: Alleen Borne, MD;  Location: [facility] OR;  Service: Open Heart Surgery;  Laterality: N/A;   TONSILLECTOMY     Patient Active Problem List   Diagnosis Date Noted   Aphasia 02/09/2023   S/P AVR 03/25/2015    CAD (coronary artery disease), native coronary artery 03/10/2015   Hypertensive heart disease 03/06/2013    Class: Chronic   Hyperlipidemia 03/06/2013    Class: Chronic   GERD (gastroesophageal reflux disease) 03/06/2013    ONSET DATE: 02/09/23   REFERRING DIAG: R47.01 (ICD-10-CM) - Aphasia I63.9 (ICD-10-CM) - Acute ischemic stroke (HCC)  THERAPY DIAG:  Aphasia  Rationale for Evaluation and Treatment: Rehabilitation  SUBJECTIVE:   SUBJECTIVE STATEMENT: "I'm OK" Pt accompanied by: significant other and family member Spouse, [name]  TODAY'S TREATMENT:                                                                                                                                         DATE:   02/21/23: [name] and [name] brought in photos of some of  his tools - naming tools with frequent mod verbal and written cues. Generated personally relevant sentences re: tools to practice at home - see Patient Instructions. To target word finding and sentence generation  Scientist, product/process development (VNeST) was utilized. The pt generated 3 subjects and objects for 3 verbs (dive, wash, deliver), for a total of 9 subject objects. Pt required frequent mod verbal and written cues. Pt generated 3 complex sentences by answering "wh" questions. Pt required frequent mod verbal and written cues to generate complex sentences.    02/19/23: [name] and [name] bring in extended family list he has been practicing. [name] wrote his 2 daughters' names with supervision cues and named his 5 siblings with extended pauses, but mod I. With frequent mod written (fill in blank) and semantic/phonemic cues, [name] named 10 car parts and 5 tools with extended time. He wrote the car parts with consistent max copying cues for spelling. Frequent mod A to ID aphasic errors writing,as well as errors copying. Provided aphasia ID card today.   02/14/23 (eval day): aphasia ed; review homework (write family names and locations)  and categorization homework for reading and writing   PATIENT EDUCATION: Education details: See Today's Treatment; See Patient Instructions; compensations for aphasia Person educated: Patient, Spouse, and Child(ren) Education method: Explanation, Demonstration, and Verbal cues Education comprehension: verbalized understanding, returned demonstration, verbal cues required, and needs further education   GOALS: Goals reviewed with patient? Yes  SHORT TERM GOALS: Target date: 03/14/23  Pt will name 8 items in 3 personally relevant categories with usual min A Baseline: Goal status: ONGOING  2.  Pt will name and write 8 family members with occasional min A Baseline:  Goal status: ONGOING  3.  Pt will generate 2-3 verbal compensations for aphasia in structured task such as Semantic Feature Analysis (SFA) with usual min to mod A Baseline:  Goal status:ONGOING  4.  Pt will comprehend simple social texts with occasional min A over 1 week Baseline:  Goal status: ONGOING  5.  Pt will use multimodal communication to answer personally relevant questions or take 2 turns in conversation with occasional min a Baseline:  Goal status: ONGOING    LONG TERM GOALS: Target date: 04/11/23  Pt will generate 12 items in personally relevant categories with occasional min A Baseline:  Goal status: ONGOING  2.  Pt will generate moderately complex sentences in structured task such as Scientist, product/process development (VNeST) with occasional min A Baseline:  Goal status: ONGOING  3.  Pt will respond to text with simple word/phrases 3x over 1 week  occasional min A Baseline:  Goal status: ONGOING  4.  Pt will ID and correct paraphasias in structured tasks with occasional min A Baseline:  Goal status: ONGOING  5.  Pt will ID and correct aphasic errors in written expression with occasional min A Baseline:  Goal status: ONGOING  6.  Pt will improve score on Communication Participation Item  Bank by 4 points Baseline: 0;  Goal status:ONGOING  ASSESSMENT:  CLINICAL IMPRESSION: Patient is a 77 y.o. 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 He is unable to comprehend short texts nor respond to texts due to aphasia. In personally relevant category (car parts) Baird did not name any items. 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 He was unable to name family members. I recommend skilled ST to maximize communication for safety, independence and to reduce caregiver burden.   OBJECTIVE IMPAIRMENTS: include aphasia. These impairments are limiting patient from return to work, household responsibilities, ADLs/IADLs, and effectively communicating at home and in community. Factors affecting potential to achieve goals and functional outcome are severity of impairments. Patient will benefit from skilled SLP services to address above impairments and improve overall function.  REHAB POTENTIAL: Good  PLAN:  SLP FREQUENCY: 2x/week  SLP DURATION: 12 weeks  PLANNED INTERVENTIONS: 92507 Treatment of speech (30 or 45 min)     Kaulder Zahner, Radene Journey, CCC-SLP 02/21/2023, 3:40 PM

## 2023-02-21 NOTE — Patient Instructions (Signed)
   The angle grinder goes into small areas  Impact Hammer removes fastener heads  The level makes sure its straight  The clamp holds things together  The reciprocating saw does rough cuts   The floor jack jacks up the car  My tools are in the tool box  The grinders grind metal in the wheel weld

## 2023-02-22 DIAGNOSIS — Z09 Encounter for follow-up examination after completed treatment for conditions other than malignant neoplasm: Secondary | ICD-10-CM | POA: Diagnosis not present

## 2023-02-22 DIAGNOSIS — I10 Essential (primary) hypertension: Secondary | ICD-10-CM | POA: Diagnosis not present

## 2023-02-22 DIAGNOSIS — R4701 Aphasia: Secondary | ICD-10-CM | POA: Diagnosis not present

## 2023-02-22 DIAGNOSIS — E7800 Pure hypercholesterolemia, unspecified: Secondary | ICD-10-CM | POA: Diagnosis not present

## 2023-02-22 DIAGNOSIS — N183 Chronic kidney disease, stage 3 unspecified: Secondary | ICD-10-CM | POA: Diagnosis not present

## 2023-02-23 ENCOUNTER — Ambulatory Visit
Admission: RE | Admit: 2023-02-23 | Discharge: 2023-02-23 | Disposition: A | Discharge status: Home / Self Care | Payer: Medicare Other | Source: Ambulatory Visit | Attending: Nurse Practitioner | Admitting: Nurse Practitioner

## 2023-02-23 ENCOUNTER — Other Ambulatory Visit: Payer: Self-pay | Admitting: Neurology

## 2023-02-23 DIAGNOSIS — Z09 Encounter for follow-up examination after completed treatment for conditions other than malignant neoplasm: Secondary | ICD-10-CM | POA: Diagnosis not present

## 2023-02-23 DIAGNOSIS — I639 Cerebral infarction, unspecified: Secondary | ICD-10-CM

## 2023-02-23 MED ORDER — CLOPIDOGREL BISULFATE 75 MG PO TABS: 75.0000 mg | ORAL_TABLET | Freq: Every day | ORAL | 3 refills | Status: AC

## 2023-02-23 NOTE — Progress Notes (Signed)
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 Called pt wife and she expressed understanding. I have [facility] pl[facility]. Wife is going to pick up today and start today. He also uses Mail order for all his other meds, so I told her that when pt follows up with Dr. Pearlean Brownie on 04/05/23, we can refill plavix via mail order. She is in agreement.   Marvel Plan, MD PhD Stroke Neurology 02/23/2023 2:08 PM

## 2023-02-26 ENCOUNTER — Ambulatory Visit: Payer: Medicare Other | Attending: Family Medicine | Admitting: Speech Pathology

## 2023-02-26 DIAGNOSIS — R4701 Aphasia: Secondary | ICD-10-CM | POA: Insufficient documentation

## 2023-02-28 ENCOUNTER — Ambulatory Visit: Payer: Medicare Other | Admitting: Speech Pathology

## 2023-02-28 ENCOUNTER — Telehealth: Payer: Self-pay

## 2023-02-28 DIAGNOSIS — R4701 Aphasia: Secondary | ICD-10-CM | POA: Diagnosis not present

## 2023-02-28 NOTE — Therapy (Signed)
OUTPATIENT SPEECH LANGUAGE PATHOLOGY APHASIA TREATMENT   Patient Name: Zachary [name] MRN: [id number] DOB:[birth year], 77 y.o., male Today's Date: 02/28/2023  PCP: Ewing Schlein, MD REFERRING PROVIDER: Loney Loh, NP  END OF SESSION:  End of Session - 02/28/23 1501     Visit Number 4    Number of Visits 25    Date for SLP Re-Evaluation 05/09/23    SLP Start Time 1445    SLP Stop Time  1530    SLP Time Calculation (min) 45 min    Activity Tolerance Patient tolerated treatment well             Past Medical History:  Diagnosis Date   Aortic valvar stenosis    Arthritis    Back pain    Erectile dysfunction    GERD (gastroesophageal reflux disease)    HTN (hypertension)    Hypercholesteremia    Urinary frequency    Past Surgical History:  Procedure Laterality Date   AORTIC VALVE REPLACEMENT N/A 03/25/2015   Procedure: AORTIC VALVE REPLACEMENT (AVR);  Surgeon: Alleen Borne, MD;  Location: [facility] OR;  Service: Open Heart Surgery;  Laterality: N/A;   CARDIAC CATHETERIZATION N/A 03/10/2015   Procedure: Right/Left Heart Cath and Coronary Angiography;  Surgeon: Lyn Records, MD;  Location: [facility] INVASIVE CV LAB;  Service: Cardiovascular;  Laterality: N/A;   CARDIAC CATHETERIZATION N/A 03/10/2015   Procedure: Intravascular Pressure Wire/FFR Study;  Surgeon: Lyn Records, MD;  Location: [facility] INVASIVE CV LAB;  Service: Cardiovascular;  Laterality: N/A;   LOOP RECORDER INSERTION N/A 02/12/2023   Procedure: LOOP RECORDER INSERTION;  Surgeon: [name], PA-C;  Location: [facility] INVASIVE CV LAB;  Service: Cardiovascular;  Laterality: N/A;   LUMBAR DISC SURGERY     TEE WITHOUT CARDIOVERSION N/A 03/25/2015   Procedure: TRANSESOPHAGEAL ECHOCARDIOGRAM (TEE);  Surgeon: Alleen Borne, MD;  Location: [facility] OR;  Service: Open Heart Surgery;  Laterality: N/A;   TONSILLECTOMY     Patient Active Problem List   Diagnosis Date Noted   Aphasia 02/09/2023   S/P AVR 03/25/2015    CAD (coronary artery disease), native coronary artery 03/10/2015   Hypertensive heart disease 03/06/2013    Class: Chronic   Hyperlipidemia 03/06/2013    Class: Chronic   GERD (gastroesophageal reflux disease) 03/06/2013    ONSET DATE: 02/09/23   REFERRING DIAG: R47.01 (ICD-10-CM) - Aphasia I63.9 (ICD-10-CM) - Acute ischemic stroke (HCC)  THERAPY DIAG:  Aphasia  Rationale for Evaluation and Treatment: Rehabilitation  SUBJECTIVE:   SUBJECTIVE STATEMENT: "I'm OK" Pt accompanied by: significant other and family member Spouse, [name]  TODAY'S TREATMENT:                                                                                                                                         DATE:   02/28/23: Targeted naming in personally relevant categories - with frequent  semantic cues he named 5 tools. With extended time and 1st letter cues he named 5 grandchildren. With extended time and 1 semantic cue he named 5 siblings.  To target word finding and sentence generation  Scientist, product/process development (VNeST) was utilized. The pt generated 3 subjects and objects for 3 verbs, (catch, shake, read) for a total of 9 subject objects. Pt required occasional min semantic cues. Pt generated 3 complex sentences by answering "wh" questions. Pt required usual min semantic and questioning cues cues to generate complex sentences.    02/21/23: [name] and [name] brought in photos of some of his tools - naming tools with frequent mod verbal and written cues. Generated personally relevant sentences re: tools to practice at home - see Patient Instructions. To target word finding and sentence generation  Scientist, product/process development (VNeST) was utilized. The pt generated 3 subjects and objects for 3 verbs (dive, wash, deliver), for a total of 9 subject objects. Pt required frequent mod verbal and written cues. Pt generated 3 complex sentences by answering "wh" questions. Pt required frequent mod  verbal and written cues to generate complex sentences.    02/19/23: [name] and [name] bring in extended family list he has been practicing. Granville wrote his 2 daughters' names with supervision cues and named his 5 siblings with extended pauses, but mod I. With frequent mod written (fill in blank) and semantic/phonemic cues, Abdias named 10 car parts and 5 tools with extended time. He wrote the car parts with consistent max copying cues for spelling. Frequent mod A to ID aphasic errors writing,as well as errors copying. Provided aphasia ID card today.   02/14/23 (eval day): aphasia ed; review homework (write family names and locations) and categorization homework for reading and writing   PATIENT EDUCATION: Education details: See Today's Treatment; See Patient Instructions; compensations for aphasia Person educated: Patient, Spouse, and Child(ren) Education method: Explanation, Demonstration, and Verbal cues Education comprehension: verbalized understanding, returned demonstration, verbal cues required, and needs further education   GOALS: Goals reviewed with patient? Yes  SHORT TERM GOALS: Target date: 03/14/23  Pt will name 8 items in 3 personally relevant categories with usual min A Baseline: Goal status: ONGOING  2.  Pt will name and write 8 family members with occasional min A Baseline:  Goal status: ONGOING  3.  Pt will generate 2-3 verbal compensations for aphasia in structured task such as Semantic Feature Analysis (SFA) with usual min to mod A Baseline:  Goal status:ONGOING  4.  Pt will comprehend simple social texts with occasional min A over 1 week Baseline:  Goal status: ONGOING  5.  Pt will use multimodal communication to answer personally relevant questions or take 2 turns in conversation with occasional min a Baseline:  Goal status: ONGOING    LONG TERM GOALS: Target date: 04/11/23  Pt will generate 12 items in personally relevant categories with occasional  min A Baseline:  Goal status: ONGOING  2.  Pt will generate moderately complex sentences in structured task such as Scientist, product/process development (VNeST) with occasional min A Baseline:  Goal status: ONGOING  3.  Pt will respond to text with simple word/phrases 3x over 1 week  occasional min A Baseline:  Goal status: ONGOING  4.  Pt will ID and correct paraphasias in structured tasks with occasional min A Baseline:  Goal status: ONGOING  5.  Pt will ID and correct aphasic errors in written expression with occasional min A Baseline:  Goal status:  ONGOING  6.  Pt will improve score on Communication Participation Item Bank by 4 points Baseline: 0;  Goal status:ONGOING  ASSESSMENT:  CLINICAL IMPRESSION: Patient is a 77 y.o. 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 He is unable to comprehend short texts nor respond to texts due to aphasia. In personally relevant category (car parts) Haydyn did not name any items. 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 He was unable to name family members. I recommend skilled ST to maximize communication for safety, independence and to reduce caregiver burden.   OBJECTIVE IMPAIRMENTS: include aphasia. These impairments are limiting patient from return to work, household responsibilities, ADLs/IADLs, and effectively communicating at home and in community. Factors affecting potential to achieve goals and functional outcome are severity of impairments. Patient will benefit from skilled SLP services to address above impairments and improve overall function.  REHAB POTENTIAL: Good  PLAN:  SLP FREQUENCY: 2x/week  SLP DURATION: 12 weeks  PLANNED INTERVENTIONS: 92507 Treatment of  speech (30 or 45 min)     Nicholl Onstott, Radene Journey, CCC-SLP 02/28/2023, 3:42 PM

## 2023-02-28 NOTE — Telephone Encounter (Signed)
Forwarding to A. Lanna Poche, PA-C :  Zachary [name],  See below, there appears to be some confusion over whether this patient needs an in office follow up post ILR implant you did in the hospital for cryptogenic stroke.  Typically we don't make the appt unless special needs.  [facility] apparently is insisting on ILR follow up with [place].   Thoughts/please advise?  We did make the follow up home call following procedure and no issues.

## 2023-02-28 NOTE — Telephone Encounter (Signed)
-----   Message from Kelford H sent at 02/28/2023  1:24 PM EST ----- Regarding: FW: EP Referral Mandy,  Patient has already had the loop implanted - it was done in the hospital by Mardelle Matte and patient was seen by [facility] in the hospital. Referral is to Dr. Cristal Deer for loop follow up. [facility] wants patient to be seen asap. What do you suggest in regards to scheduling the patient? Does the patient need to be scheduled with EP for a follow up on the loop?  Cassie ----- Message ----- From: Garald Braver Sent: 02/27/2023   4:56 PM EST To: Ronn Melena Subject: FW: EP Referral                                 ----- Message ----- From: Lenor Coffin, RN Sent: 02/27/2023   4:49 PM EST To: Garald Braver Subject: RE: EP Referral                                As far as the loop, the patient does not need to be seen prior to the procedure. Now if they are wanting a general cardiology clearance, the patient will need to be seen within 1 year. I am out the rest of the week and Monday. If you need anything further, please reach out to Laren Boom, RN.  Marliss Czar, RN ----- Message ----- From: Garald Braver Sent: 02/27/2023   4:09 PM EST To: Lenor Coffin, RN Subject: EP Referral                                    Jodie Echevaria,  We received a referral on this patient to follow up with Dr. Cristal Deer but the referral is for a loop implant follow up. It looks like the patient had the loop implanted in the hospital by [name] and [facility] did the consult. One of the schedulers here spoke with the patients wife in regards to the referral and she said that the patient did not need to be seen until August of next year (I believe she thinks this was a gen card referral). But, [facility] called in wanting the patient to get scheduled ASAP. I spoke with Cassie in regards to scheduling and she advised to reach out to you, we see that you did a phone note on the patient on 10/24 so not sure if a follow up  appointment is necessary. Does the patient need to be scheduled with EP for a follow up on the loop implant?

## 2023-03-01 NOTE — Telephone Encounter (Signed)
[phone]  Office number for Dr. Merri Brunette who is attempting outreach for monitoring / follow up of loop and gen card care.    Appears from notes in computer that Dr. Katrinka Blazing wanted to ensure loop was being followed and that patient also was set up with a general cardiology.  I explained to [employer] and Dinah Beers through staff message that no in office appt with EP is needed and we have ensured patient is following our clinic.  Remote monitoring has been initiated and next send will be 03/20/23 per schedule.

## 2023-03-05 ENCOUNTER — Ambulatory Visit: Payer: Medicare Other | Admitting: Speech Pathology

## 2023-03-05 DIAGNOSIS — R4701 Aphasia: Secondary | ICD-10-CM | POA: Diagnosis not present

## 2023-03-05 NOTE — Therapy (Signed)
OUTPATIENT SPEECH LANGUAGE PATHOLOGY APHASIA TREATMENT   Patient Name: Zachary [name] MRN: [id number] DOB:Mar 07, 1946, 77 y.o., male Today's Date: 03/05/2023  PCP: Zachary Schlein, MD REFERRING PROVIDER: Loney Loh, NP  END OF SESSION:  End of Session - 03/05/23 1503     Visit Number 5    Number of Visits 25    Date for SLP Re-Evaluation 05/09/23    SLP Start Time 1445    SLP Stop Time  1530    SLP Time Calculation (min) 45 min    Activity Tolerance Patient tolerated treatment well             Past Medical History:  Diagnosis Date   Aortic valvar stenosis    Arthritis    Back pain    Erectile dysfunction    GERD (gastroesophageal reflux disease)    HTN (hypertension)    Hypercholesteremia    Urinary frequency    Past Surgical History:  Procedure Laterality Date   AORTIC VALVE REPLACEMENT N/A 03/25/2015   Procedure: AORTIC VALVE REPLACEMENT (AVR);  Surgeon: Alleen Borne, MD;  Location: [facility] OR;  Service: Open Heart Surgery;  Laterality: N/A;   CARDIAC CATHETERIZATION N/A 03/10/2015   Procedure: Right/Left Heart Cath and Coronary Angiography;  Surgeon: Lyn Records, MD;  Location: [facility] INVASIVE CV LAB;  Service: Cardiovascular;  Laterality: N/A;   CARDIAC CATHETERIZATION N/A 03/10/2015   Procedure: Intravascular Pressure Wire/FFR Study;  Surgeon: Lyn Records, MD;  Location: [facility] INVASIVE CV LAB;  Service: Cardiovascular;  Laterality: N/A;   LOOP RECORDER INSERTION N/A 02/12/2023   Procedure: LOOP RECORDER INSERTION;  Surgeon: [name], PA-C;  Location: [facility] INVASIVE CV LAB;  Service: Cardiovascular;  Laterality: N/A;   LUMBAR DISC SURGERY     TEE WITHOUT CARDIOVERSION N/A 03/25/2015   Procedure: TRANSESOPHAGEAL ECHOCARDIOGRAM (TEE);  Surgeon: Alleen Borne, MD;  Location: [facility] OR;  Service: Open Heart Surgery;  Laterality: N/A;   TONSILLECTOMY     Patient Active Problem List   Diagnosis Date Noted   Aphasia 02/09/2023   S/P AVR 03/25/2015    CAD (coronary artery disease), native coronary artery 03/10/2015   Hypertensive heart disease 03/06/2013    Class: Chronic   Hyperlipidemia 03/06/2013    Class: Chronic   GERD (gastroesophageal reflux disease) 03/06/2013    ONSET DATE: 02/09/23   REFERRING DIAG: R47.01 (ICD-10-CM) - Aphasia I63.9 (ICD-10-CM) - Acute ischemic stroke (HCC)  THERAPY DIAG:  Aphasia  Rationale for Evaluation and Treatment: Rehabilitation  SUBJECTIVE:   SUBJECTIVE STATEMENT: "I'm OK" Pt accompanied by: significant other and family member Spouse, Zachary [name]  TODAY'S TREATMENT:                                                                                                                                         DATE:   03/05/23: Zachary [name] returns [facility] completed correctly. Targeted verbal compensations for  aphasia using semantic feature analysis (SFA) generating 3-4 descriptions of common objects in structured task with extended time consistently, usual min questioning cues and semantic cues. Convergent naming task with occasional 1st letter or semantic cues - Zachary [name] named 8/10. Compensations for restaurant ordering looking up menu prior (they are going to [facility]) -  role played placing drink order with mod I, ordering main and 2 sides with occasional questioning cues. Wrote down order for him to practice prior to going out. Included strategy of writing down a car part he needs to buy and bringing in the written order to A him in requesting the part independently rather than relying on his wife. SFA HW provided.   02/28/23: Targeted naming in personally relevant categories - with frequent semantic cues he named 5 tools. With extended time and 1st letter cues he named 5 grandchildren. With extended time and 1 semantic cue he named 5 siblings.  To target word finding and sentence generation  Scientist, product/process development (VNeST) was utilized. The pt generated 3 subjects and objects for 3 verbs, (catch,  shake, read) for a total of 9 subject objects. Pt required occasional min semantic cues. Pt generated 3 complex sentences by answering "wh" questions. Pt required usual min semantic and questioning cues cues to generate complex sentences.    02/21/23: Zachary [name] and Zachary [name] brought in photos of some of his tools - naming tools with frequent mod verbal and written cues. Generated personally relevant sentences re: tools to practice at home - see Patient Instructions. To target word finding and sentence generation  Scientist, product/process development (VNeST) was utilized. The pt generated 3 subjects and objects for 3 verbs (dive, wash, deliver), for a total of 9 subject objects. Pt required frequent mod verbal and written cues. Pt generated 3 complex sentences by answering "wh" questions. Pt required frequent mod verbal and written cues to generate complex sentences.    02/19/23: Zachary [name] and Zachary [name] bring in extended family list he has been practicing. Zachary [name] wrote his 2 daughters' names with supervision cues and named his 5 siblings with extended pauses, but mod I. With frequent mod written (fill in blank) and semantic/phonemic cues, Zachary [name] named 10 car parts and 5 tools with extended time. He wrote the car parts with consistent max copying cues for spelling. Frequent mod A to ID aphasic errors writing,as well as errors copying. Provided aphasia ID card today.   02/14/23 (eval day): aphasia ed; review homework (write family names and locations) and categorization homework for reading and writing   PATIENT EDUCATION: Education details: See Today's Treatment; See Patient Instructions; compensations for aphasia Person educated: Patient, Spouse, and Child(ren) Education method: Explanation, Demonstration, and Verbal cues Education comprehension: verbalized understanding, returned demonstration, verbal cues required, and needs further education   GOALS: Goals reviewed with patient? Yes  SHORT TERM GOALS:  Target date: 03/14/23  Pt will name 8 items in 3 personally relevant categories with usual min A Baseline: Goal status: ONGOING  2.  Pt will name and write 8 family members with occasional min A Baseline:  Goal status: ONGOING  3.  Pt will generate 2-3 verbal compensations for aphasia in structured task such as Semantic Feature Analysis (SFA) with usual min to mod A Baseline:  Goal status:ONGOING  4.  Pt will comprehend simple social texts with occasional min A over 1 week Baseline:  Goal status: ONGOING  5.  Pt will use multimodal communication to answer personally relevant questions or take 2 turns  in conversation with occasional min a Baseline:  Goal status: ONGOING    LONG TERM GOALS: Target date: 04/11/23  Pt will generate 12 items in personally relevant categories with occasional min A Baseline:  Goal status: ONGOING  2.  Pt will generate moderately complex sentences in structured task such as Scientist, product/process development (VNeST) with occasional min A Baseline:  Goal status: ONGOING  3.  Pt will respond to text with simple word/phrases 3x over 1 week  occasional min A Baseline:  Goal status: ONGOING  4.  Pt will ID and correct paraphasias in structured tasks with occasional min A Baseline:  Goal status: ONGOING  5.  Pt will ID and correct aphasic errors in written expression with occasional min A Baseline:  Goal status: ONGOING  6.  Pt will improve score on Communication Participation Item Bank by 4 points Baseline: 0;  Goal status:ONGOING  ASSESSMENT:  CLINICAL IMPRESSION: Patient is a 77 y.o. 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 He is unable to comprehend short texts nor respond to texts due to aphasia. In  personally relevant category (car parts) Ricke did not name any items. 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 He was unable to name family members. I recommend skilled ST to maximize communication for safety, independence and to reduce caregiver burden.   OBJECTIVE IMPAIRMENTS: include aphasia. These impairments are limiting patient from return to work, household responsibilities, ADLs/IADLs, and effectively communicating at home and in community. Factors affecting potential to achieve goals and functional outcome are severity of impairments. Patient will benefit from skilled SLP services to address above impairments and improve overall function.  REHAB POTENTIAL: Good  PLAN:  SLP FREQUENCY: 2x/week  SLP DURATION: 12 weeks  PLANNED INTERVENTIONS: 92507 Treatment of speech (30 or 45 min)     Shiloh Southern, Radene Journey, CCC-SLP 03/05/2023, 4:08 PM

## 2023-03-05 NOTE — Patient Instructions (Signed)
   Practice your [employer] order  Sweet tea with lemon  Grilled Chicken dinner with loaded mashed potatoes and broccoli

## 2023-03-07 ENCOUNTER — Ambulatory Visit: Payer: Medicare Other | Admitting: Speech Pathology

## 2023-03-07 ENCOUNTER — Encounter: Payer: Self-pay | Admitting: Speech Pathology

## 2023-03-07 DIAGNOSIS — R4701 Aphasia: Secondary | ICD-10-CM | POA: Diagnosis not present

## 2023-03-07 NOTE — Therapy (Signed)
OUTPATIENT SPEECH LANGUAGE PATHOLOGY APHASIA TREATMENT   Patient Name: Zachary [name] MRN: [id number] DOB:28-Apr-1945, 77 y.o., male Today's Date: 03/07/2023  PCP: Zachary Schlein, MD REFERRING PROVIDER: Loney Loh, NP  END OF SESSION:  End of Session - 03/07/23 1230     Visit Number 6    Number of Visits 25    Date for SLP Re-Evaluation 05/09/23    SLP Start Time 1230    SLP Stop Time  1315    SLP Time Calculation (min) 45 min    Activity Tolerance Patient tolerated treatment well             Past Medical History:  Diagnosis Date   Aortic valvar stenosis    Arthritis    Back pain    Erectile dysfunction    GERD (gastroesophageal reflux disease)    HTN (hypertension)    Hypercholesteremia    Urinary frequency    Past Surgical History:  Procedure Laterality Date   AORTIC VALVE REPLACEMENT N/A 03/25/2015   Procedure: AORTIC VALVE REPLACEMENT (AVR);  Surgeon: Zachary Borne, MD;  Location: [facility] OR;  Service: Open Heart Surgery;  Laterality: N/A;   CARDIAC CATHETERIZATION N/A 03/10/2015   Procedure: Right/Left Heart Cath and Coronary Angiography;  Surgeon: Zachary Records, MD;  Location: [facility] INVASIVE CV LAB;  Service: Cardiovascular;  Laterality: N/A;   CARDIAC CATHETERIZATION N/A 03/10/2015   Procedure: Intravascular Pressure Wire/FFR Study;  Surgeon: Zachary Records, MD;  Location: [facility] INVASIVE CV LAB;  Service: Cardiovascular;  Laterality: N/A;   LOOP RECORDER INSERTION N/A 02/12/2023   Procedure: LOOP RECORDER INSERTION;  Surgeon: Zachary Freer, PA-C;  Location: [facility] INVASIVE CV LAB;  Service: Cardiovascular;  Laterality: N/A;   LUMBAR DISC SURGERY     TEE WITHOUT CARDIOVERSION N/A 03/25/2015   Procedure: TRANSESOPHAGEAL ECHOCARDIOGRAM (TEE);  Surgeon: Zachary Borne, MD;  Location: [facility] OR;  Service: Open Heart Surgery;  Laterality: N/A;   TONSILLECTOMY     Patient Active Problem List   Diagnosis Date Noted   Aphasia 02/09/2023   S/P AVR 03/25/2015    CAD (coronary artery disease), native coronary artery 03/10/2015   Hypertensive heart disease 03/06/2013    Class: Chronic   Hyperlipidemia 03/06/2013    Class: Chronic   GERD (gastroesophageal reflux disease) 03/06/2013    ONSET DATE: 02/09/23   REFERRING DIAG: R47.01 (ICD-10-CM) - Aphasia I63.9 (ICD-10-CM) - Acute ischemic stroke (HCC)  THERAPY DIAG:  Aphasia  Rationale for Evaluation and Treatment: Rehabilitation  SUBJECTIVE:   SUBJECTIVE STATEMENT: "I let my grandson order" re: instruction to order food at the restaurant Pt accompanied by: significant other and family member Spouse, Zachary [name]  TODAY'S TREATMENT:                                                                                                                                         DATE:  03/07/23: Zachary [name] named his 4 siblings, 4 grandchildren and 2 daughters with supervision cues and min extended time. Targeted verbal compensations for aphasia with SFA  chart and "tell me more" level 3 on Talk Path App. Zachary [name] completed SFA chart for 7 words/photos with usual mod questioning cues and foil cues. Using SFA chart, he was able to name 5/7 items with usual mod written (fill in blank or 1st letter cues). Demonstrated 3 recommended home activities on Talk Path App.. In conversation, Zachary [name] used gestures and verbal compensations for aphasia 2/6x with frequent mod questioning cues to elicit salient descriptions.  03/05/23: Zachary [name] returns Zachary [name] completed correctly. Targeted verbal compensations for aphasia using semantic feature analysis (SFA) generating 3-4 descriptions of common objects in structured task with extended time consistently, usual min questioning cues and semantic cues. Convergent naming task with occasional 1st letter or semantic cues - Zachary [name] named 8/10. Compensations for restaurant ordering looking up menu prior (they are going to [facility]) -  role played placing drink order with mod I, ordering main and  2 sides with occasional questioning cues. Wrote down order for him to practice prior to going out. Included strategy of writing down a car part he needs to buy and bringing in the written order to A him in requesting the part independently rather than relying on his wife. SFA HW provided.   02/28/23: Targeted naming in personally relevant categories - with frequent semantic cues he named 5 tools. With extended time and 1st letter cues he named 5 grandchildren. With extended time and 1 semantic cue he named 5 siblings.  To target word finding and sentence generation  Scientist, product/process development (VNeST) was utilized. The pt generated 3 subjects and objects for 3 verbs, (catch, shake, read) for a total of 9 subject objects. Pt required occasional min semantic cues. Pt generated 3 complex sentences by answering "wh" questions. Pt required usual min semantic and questioning cues cues to generate complex sentences.    02/21/23: Zachary [name] and Zachary [name] brought in photos of some of his tools - naming tools with frequent mod verbal and written cues. Generated personally relevant sentences re: tools to practice at home - see Patient Instructions. To target word finding and sentence generation  Scientist, product/process development (VNeST) was utilized. The pt generated 3 subjects and objects for 3 verbs (dive, wash, deliver), for a total of 9 subject objects. Pt required frequent mod verbal and written cues. Pt generated 3 complex sentences by answering "wh" questions. Pt required frequent mod verbal and written cues to generate complex sentences.    02/19/23: Zachary [name] and Zachary [name] bring in extended family list he has been practicing. Zachary [name] wrote his 2 daughters' names with supervision cues and named his 5 siblings with extended pauses, but mod I. With frequent mod written (fill in blank) and semantic/phonemic cues, Zachary [name] named 10 car parts and 5 tools with extended time. He wrote the car parts with consistent max  copying cues for spelling. Frequent mod A to ID aphasic errors writing,as well as errors copying. Provided aphasia ID card today.   02/14/23 (eval day): aphasia ed; review homework (write family names and locations) and categorization homework for reading and writing   PATIENT EDUCATION: Education details: See Today's Treatment; See Patient Instructions; compensations for aphasia Person educated: Patient, Spouse, and Child(ren) Education method: Explanation, Demonstration, and Verbal cues Education comprehension: verbalized understanding, returned demonstration, verbal cues required, and needs further education   GOALS: Goals reviewed with patient? Yes  SHORT TERM GOALS: Target date: 03/14/23  Pt will name 8 items in 3 personally relevant categories with usual min A Baseline: Goal status: ONGOING  2.  Pt will name and write 8 family members with occasional min A Baseline:  Goal status: MET  3.  Pt will generate 2-3 verbal compensations for aphasia in structured task such as Semantic Feature Analysis (SFA) with usual min to mod A Baseline:  Goal status:MET  4.  Pt will comprehend simple social texts with occasional min A over 1 week Baseline:  Goal status: ONGOING  5.  Pt will use multimodal communication to answer personally relevant questions or take 2 turns in conversation with occasional min a Baseline:  Goal status: MET    LONG TERM GOALS: Target date: 04/11/23  Pt will generate 12 items in personally relevant categories with occasional min A Baseline:  Goal status: ONGOING  2.  Pt will generate moderately complex sentences in structured task such as Scientist, product/process development (VNeST) with occasional min A Baseline:  Goal status: ONGOING  3.  Pt will respond to text with simple word/phrases 3x over 1 week  occasional min A Baseline:  Goal status: ONGOING  4.  Pt will ID and correct paraphasias in structured tasks with occasional min A Baseline:   Goal status: ONGOING  5.  Pt will ID and correct aphasic errors in written expression with occasional min A Baseline:  Goal status: ONGOING  6.  Pt will improve score on Communication Participation Item Bank by 4 points Baseline: 0;  Goal status:ONGOING  ASSESSMENT:  CLINICAL IMPRESSION: Patient is a 77 y.o. 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 He continues to improve and is able to name family members, simple tools and take 2-3 turns in conversation with mod A. He continues to rely on family and his wife to repair communication break downs and to communicate in the community. I recommend skilled ST to maximize communication for safety, independence and to reduce caregiver burden.   OBJECTIVE IMPAIRMENTS: include aphasia. These impairments are limiting patient from return to work, household responsibilities, ADLs/IADLs, and effectively communicating at home and in community. Factors affecting potential to achieve goals and functional outcome are severity of impairments. Patient will benefit from skilled SLP services to address above impairments and improve overall function.  REHAB POTENTIAL: Good  PLAN:  SLP FREQUENCY: 2x/week  SLP DURATION: 12 weeks  PLANNED INTERVENTIONS: 92507 Treatment of speech (30 or 45 min)     Keevan Wolz, Radene Journey, CCC-SLP 03/07/2023, 1:30 PM

## 2023-03-07 NOTE — Patient Instructions (Signed)
    On a tablet - Talk Path App by [employer] - tell me more  Naming nouns -   Writing - sentence scramble, complete the phrase

## 2023-03-12 ENCOUNTER — Encounter: Payer: Self-pay | Admitting: Speech Pathology

## 2023-03-12 ENCOUNTER — Ambulatory Visit: Payer: Medicare Other | Admitting: Speech Pathology

## 2023-03-12 DIAGNOSIS — R4701 Aphasia: Secondary | ICD-10-CM

## 2023-03-12 NOTE — Therapy (Signed)
OUTPATIENT SPEECH LANGUAGE PATHOLOGY APHASIA TREATMENT   Patient Name: Zachary [name] MRN: [id number] DOB:[birth year], 77 y.o., male Today's Date: 03/12/2023  PCP: Ewing Schlein, MD REFERRING PROVIDER: Loney Loh, NP  END OF SESSION:  End of Session - 03/12/23 0932     Visit Number 7    Number of Visits 25    Date for SLP Re-Evaluation 05/09/23    SLP Start Time 0930    SLP Stop Time  1015    SLP Time Calculation (min) 45 min    Activity Tolerance Patient tolerated treatment well             Past Medical History:  Diagnosis Date   Aortic valvar stenosis    Arthritis    Back pain    Erectile dysfunction    GERD (gastroesophageal reflux disease)    HTN (hypertension)    Hypercholesteremia    Urinary frequency    Past Surgical History:  Procedure Laterality Date   AORTIC VALVE REPLACEMENT N/A 03/25/2015   Procedure: AORTIC VALVE REPLACEMENT (AVR);  Surgeon: Alleen Borne, MD;  Location: [facility] OR;  Service: Open Heart Surgery;  Laterality: N/A;   CARDIAC CATHETERIZATION N/A 03/10/2015   Procedure: Right/Left Heart Cath and Coronary Angiography;  Surgeon: Lyn Records, MD;  Location: [facility] INVASIVE CV LAB;  Service: Cardiovascular;  Laterality: N/A;   CARDIAC CATHETERIZATION N/A 03/10/2015   Procedure: Intravascular Pressure Wire/FFR Study;  Surgeon: Lyn Records, MD;  Location: [facility] INVASIVE CV LAB;  Service: Cardiovascular;  Laterality: N/A;   LOOP RECORDER INSERTION N/A 02/12/2023   Procedure: LOOP RECORDER INSERTION;  Surgeon: [name], PA-C;  Location: [facility] INVASIVE CV LAB;  Service: Cardiovascular;  Laterality: N/A;   LUMBAR DISC SURGERY     TEE WITHOUT CARDIOVERSION N/A 03/25/2015   Procedure: TRANSESOPHAGEAL ECHOCARDIOGRAM (TEE);  Surgeon: Alleen Borne, MD;  Location: [facility] OR;  Service: Open Heart Surgery;  Laterality: N/A;   TONSILLECTOMY     Patient Active Problem List   Diagnosis Date Noted   Aphasia 02/09/2023   S/P AVR 03/25/2015    CAD (coronary artery disease), native coronary artery 03/10/2015   Hypertensive heart disease 03/06/2013    Class: Chronic   Hyperlipidemia 03/06/2013    Class: Chronic   GERD (gastroesophageal reflux disease) 03/06/2013    ONSET DATE: 02/09/23   REFERRING DIAG: R47.01 (ICD-10-CM) - Aphasia I63.9 (ICD-10-CM) - Acute ischemic stroke (HCC)  THERAPY DIAG:  Aphasia  Rationale for Evaluation and Treatment: Rehabilitation  SUBJECTIVE:   SUBJECTIVE STATEMENT: "He ordered a car part by himself" Pt accompanied by: significant other and family member Spouse, [name]  TODAY'S TREATMENT:                                                                                                                                         DATE:   03/12/23: Zachary [name] had success ordering  a car part in person. Targeted word finding and written expression in divergent naming task - he required frequent mod semantic and phrase completion cues to generate 11 basic car parts - he required consistent mod (fill in blank) and max - (copy cues). He required max A to ID spelling and copying errors and to correct these. 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   03/07/23: Zachary [name] named his 4 siblings, 4 grandchildren and 2 daughters with supervision cues and min extended time. Targeted verbal compensations for aphasia with SFA  chart and "tell me more" level 3 on Talk Path App. Zachary [name] completed SFA chart for 7 words/photos with usual mod questioning cues and foil cues. Using SFA chart, he was able to name 5/7 items with usual mod written (fill in blank or 1st letter cues). Demonstrated 3 recommended home activities on Talk Path App.. In conversation, Zachary [name] used gestures and verbal compensations for aphasia 2/6x with frequent mod questioning cues to elicit salient descriptions.  03/05/23: Zachary [name] returns [facility]  completed correctly. Targeted verbal compensations for aphasia using semantic feature analysis (SFA) generating 3-4 descriptions of common objects in structured task with extended time consistently, usual min questioning cues and semantic cues. Convergent naming task with occasional 1st letter or semantic cues - Zachary [name] named 8/10. Compensations for restaurant ordering looking up menu prior (they are going to [facility]) -  role played placing drink order with mod I, ordering main and 2 sides with occasional questioning cues. Wrote down order for him to practice prior to going out. Included strategy of writing down a car part he needs to buy and bringing in the written order to A him in requesting the part independently rather than relying on his wife. SFA HW provided.   02/28/23: Targeted naming in personally relevant categories - with frequent semantic cues he named 5 tools. With extended time and 1st letter cues he named 5 grandchildren. With extended time and 1 semantic cue he named 5 siblings.  To target word finding and sentence generation  Scientist, product/process development (VNeST) was utilized. The pt generated 3 subjects and objects for 3 verbs, (catch, shake, read) for a total of 9 subject objects. Pt required occasional min semantic cues. Pt generated 3 complex sentences by answering "wh" questions. Pt required usual min semantic and questioning cues cues to generate complex sentences.    02/21/23: Zachary [name] brought in photos of some of his tools - naming tools with frequent mod verbal and written cues. Generated personally relevant sentences re: tools to practice at home - see Patient Instructions. To target word finding and sentence generation  Scientist, product/process development (VNeST) was utilized. The pt generated 3 subjects and objects for 3 verbs (dive, wash, deliver), for a total of 9 subject objects. Pt required frequent mod verbal and written cues. Pt generated 3  complex sentences by answering "wh" questions. Pt required frequent mod verbal and written cues to generate complex sentences.    02/19/23: Zachary [name] bring in extended family list he has been practicing. Zachary [name] wrote his 2 daughters' names with supervision cues and named his 5 siblings with extended pauses, but mod I. With frequent mod written (fill in blank) and semantic/phonemic cues, Zachary [name] named 10 car parts and 5 tools with extended time. He wrote the car parts with consistent max copying cues for spelling.  Frequent mod A to ID aphasic errors writing,as well as errors copying. Provided aphasia ID card today.   02/14/23 (eval day): aphasia ed; review homework (write family names and locations) and categorization homework for reading and writing   PATIENT EDUCATION: Education details: See Today's Treatment; See Patient Instructions; compensations for aphasia Person educated: Patient, Spouse, and Child(ren) Education method: Explanation, Demonstration, and Verbal cues Education comprehension: verbalized understanding, returned demonstration, verbal cues required, and needs further education   GOALS: Goals reviewed with patient? Yes  SHORT TERM GOALS: Target date: 03/14/23  Pt will name 8 items in 3 personally relevant categories with usual min A Baseline: Goal status: ONGOING  2.  Pt will name and write 8 family members with occasional min A Baseline:  Goal status: MET  3.  Pt will generate 2-3 verbal compensations for aphasia in structured task such as Semantic Feature Analysis (SFA) with usual min to mod A Baseline:  Goal status:MET  4.  Pt will comprehend simple social texts with occasional min A over 1 week Baseline:  Goal status: ONGOING  5.  Pt will use multimodal communication to answer personally relevant questions or take 2 turns in conversation with occasional min a Baseline:  Goal status: MET    LONG TERM GOALS: Target date: 04/11/23  Pt will generate  12 items in personally relevant categories with occasional min A Baseline:  Goal status: ONGOING  2.  Pt will generate moderately complex sentences in structured task such as Scientist, product/process development (VNeST) with occasional min A Baseline:  Goal status: ONGOING  3.  Pt will respond to text with simple word/phrases 3x over 1 week  occasional min A Baseline:  Goal status: ONGOING  4.  Pt will ID and correct paraphasias in structured tasks with occasional min A Baseline:  Goal status: ONGOING  5.  Pt will ID and correct aphasic errors in written expression with occasional min A Baseline:  Goal status: ONGOING  6.  Pt will improve score on Communication Participation Item Bank by 4 points Baseline: 0;  Goal status:ONGOING  ASSESSMENT:  CLINICAL IMPRESSION: Patient is a 77 y.o. 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 He continues to improve and is able to name family members, simple tools and take 4-5 turns in conversation with mod A. Zachary [name] is starting to communicate in simple interactions in the community. He continues to rely on family and his wife to repair communication break downs and to communicate more complex information in the community. I recommend skilled ST to maximize communication for safety, independence and to reduce caregiver burden.   OBJECTIVE IMPAIRMENTS: include aphasia. These impairments are limiting patient from return to work, household responsibilities, ADLs/IADLs, and effectively communicating at home and in community. Factors affecting potential to achieve goals and functional outcome are severity of impairments. Patient will benefit from skilled SLP services to address above impairments and improve overall function.  REHAB POTENTIAL: Good  PLAN:  SLP FREQUENCY: 2x/week  SLP DURATION: 12 weeks  PLANNED INTERVENTIONS: 92507 Treatment of speech (30 or 45 min)     Anaira Seay, Radene Journey,  CCC-SLP 03/12/2023, 12:03 PM

## 2023-03-12 NOTE — Patient Instructions (Signed)
   Write 15 tools - bring it back in  Bring I pad

## 2023-03-14 ENCOUNTER — Ambulatory Visit: Payer: Medicare Other | Admitting: Speech Pathology

## 2023-03-14 ENCOUNTER — Encounter: Payer: Self-pay | Admitting: Speech Pathology

## 2023-03-14 DIAGNOSIS — R4701 Aphasia: Secondary | ICD-10-CM | POA: Diagnosis not present

## 2023-03-14 NOTE — Therapy (Signed)
OUTPATIENT SPEECH LANGUAGE PATHOLOGY APHASIA TREATMENT   Patient Name: Zachary [name] MRN: [id number] DOB:03/21/1946, 77 y.o., male Today's Date: 03/14/2023  PCP: Ewing Schlein, MD REFERRING PROVIDER: Loney Loh, NP  END OF SESSION:  End of Session - 03/14/23 0938     Visit Number 8    Number of Visits 25    Date for SLP Re-Evaluation 05/09/23    SLP Start Time 0930    SLP Stop Time  1015    SLP Time Calculation (min) 45 min    Activity Tolerance Patient tolerated treatment well             Past Medical History:  Diagnosis Date   Aortic valvar stenosis    Arthritis    Back pain    Erectile dysfunction    GERD (gastroesophageal reflux disease)    HTN (hypertension)    Hypercholesteremia    Urinary frequency    Past Surgical History:  Procedure Laterality Date   AORTIC VALVE REPLACEMENT N/A 03/25/2015   Procedure: AORTIC VALVE REPLACEMENT (AVR);  Surgeon: Alleen Borne, MD;  Location: [facility] OR;  Service: Open Heart Surgery;  Laterality: N/A;   CARDIAC CATHETERIZATION N/A 03/10/2015   Procedure: Right/Left Heart Cath and Coronary Angiography;  Surgeon: Lyn Records, MD;  Location: [facility] INVASIVE CV LAB;  Service: Cardiovascular;  Laterality: N/A;   CARDIAC CATHETERIZATION N/A 03/10/2015   Procedure: Intravascular Pressure Wire/FFR Study;  Surgeon: Lyn Records, MD;  Location: [facility] INVASIVE CV LAB;  Service: Cardiovascular;  Laterality: N/A;   LOOP RECORDER INSERTION N/A 02/12/2023   Procedure: LOOP RECORDER INSERTION;  Surgeon: [name], PA-C;  Location: [facility] INVASIVE CV LAB;  Service: Cardiovascular;  Laterality: N/A;   LUMBAR DISC SURGERY     TEE WITHOUT CARDIOVERSION N/A 03/25/2015   Procedure: TRANSESOPHAGEAL ECHOCARDIOGRAM (TEE);  Surgeon: Alleen Borne, MD;  Location: [facility] OR;  Service: Open Heart Surgery;  Laterality: N/A;   TONSILLECTOMY     Patient Active Problem List   Diagnosis Date Noted   Aphasia 02/09/2023   S/P AVR 03/25/2015    CAD (coronary artery disease), native coronary artery 03/10/2015   Hypertensive heart disease 03/06/2013    Class: Chronic   Hyperlipidemia 03/06/2013    Class: Chronic   GERD (gastroesophageal reflux disease) 03/06/2013    ONSET DATE: 02/09/23   REFERRING DIAG: R47.01 (ICD-10-CM) - Aphasia I63.9 (ICD-10-CM) - Acute ischemic stroke (HCC)  THERAPY DIAG:  Aphasia  Rationale for Evaluation and Treatment: Rehabilitation  SUBJECTIVE:   SUBJECTIVE STATEMENT: "He ordered a car part by himself" Pt accompanied by: significant other and family member Spouse, Zachary [name]  TODAY'S TREATMENT:                                                                                                                                         DATE:   03/14/23: [name] returns with homework  of listing 15 tools - he used internet for help, he Id'd 3 tools he couldn't name until this activity. They brought in Ipad with questions re: Talk Path - demonstrated exercises (writing and speaking) and 3 activities in each. Kyon completed level 3 sentence unscramble with initial min A. Targeted written expression and word finding verbalizing and writing names of friends and co-workers - [name] generated 8 names with occasional semantic cues from wife. He required frequent mod to max A to write names accurately and correct errors.   11/18/24[name] had success ordering a car part in person. Targeted word finding and written expression in divergent naming task - he required frequent mod semantic and phrase completion cues to generate 11 basic car parts - he required consistent mod (fill in blank) and max - (copy cues). He required max A to ID spelling and copying errors and to correct these. 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   03/07/23: [name] named his 4 siblings, 4 grandchildren and 2 daughters with  supervision cues and min extended time. Targeted verbal compensations for aphasia with SFA  chart and "tell me more" level 3 on Talk Path App. Nathanyel completed SFA chart for 7 words/photos with usual mod questioning cues and foil cues. Using SFA chart, he was able to name 5/7 items with usual mod written (fill in blank or 1st letter cues). Demonstrated 3 recommended home activities on Talk Path App.. In conversation, Keian used gestures and verbal compensations for aphasia 2/6x with frequent mod questioning cues to elicit salient descriptions.  03/05/23: Yaakov returns [facility] completed correctly. Targeted verbal compensations for aphasia using semantic feature analysis (SFA) generating 3-4 descriptions of common objects in structured task with extended time consistently, usual min questioning cues and semantic cues. Convergent naming task with occasional 1st letter or semantic cues - Neziah named 8/10. Compensations for restaurant ordering looking up menu prior (they are going to [facility]) -  role played placing drink order with mod I, ordering main and 2 sides with occasional questioning cues. Wrote down order for him to practice prior to going out. Included strategy of writing down a car part he needs to buy and bringing in the written order to A him in requesting the part independently rather than relying on his wife. SFA HW provided.   02/28/23: Targeted naming in personally relevant categories - with frequent semantic cues he named 5 tools. With extended time and 1st letter cues he named 5 grandchildren. With extended time and 1 semantic cue he named 5 siblings.  To target word finding and sentence generation  Scientist, product/process development (VNeST) was utilized. The pt generated 3 subjects and objects for 3 verbs, (catch, shake, read) for a total of 9 subject objects. Pt required occasional min semantic cues. Pt generated 3 complex sentences by answering "wh" questions. Pt required usual  min semantic and questioning cues cues to generate complex sentences.    02/21/23: [name] and Zachary [name] brought in photos of some of his tools - naming tools with frequent mod verbal and written cues. Generated personally relevant sentences re: tools to practice at home - see Patient Instructions. To target word finding and sentence generation  Scientist, product/process development (VNeST) was utilized. The pt generated 3 subjects and objects for 3 verbs (dive, wash, deliver), for a total of 9 subject objects. Pt required  frequent mod verbal and written cues. Pt generated 3 complex sentences by answering "wh" questions. Pt required frequent mod verbal and written cues to generate complex sentences.    02/19/23: [name] and Zachary [name] bring in extended family list he has been practicing. Jode wrote his 2 daughters' names with supervision cues and named his 5 siblings with extended pauses, but mod I. With frequent mod written (fill in blank) and semantic/phonemic cues, Elhadj named 10 car parts and 5 tools with extended time. He wrote the car parts with consistent max copying cues for spelling. Frequent mod A to ID aphasic errors writing,as well as errors copying. Provided aphasia ID card today.   02/14/23 (eval day): aphasia ed; review homework (write family names and locations) and categorization homework for reading and writing   PATIENT EDUCATION: Education details: See Today's Treatment; See Patient Instructions; compensations for aphasia Person educated: Patient, Spouse, and Child(ren) Education method: Explanation, Demonstration, and Verbal cues Education comprehension: verbalized understanding, returned demonstration, verbal cues required, and needs further education   GOALS: Goals reviewed with patient? Yes  SHORT TERM GOALS: Target date: 03/14/23  Pt will name 8 items in 3 personally relevant categories with usual min A Baseline: Goal status: ONGOING  2.  Pt will name and write 8 family  members with occasional min A Baseline:  Goal status: MET  3.  Pt will generate 2-3 verbal compensations for aphasia in structured task such as Semantic Feature Analysis (SFA) with usual min to mod A Baseline:  Goal status:MET  4.  Pt will comprehend simple social texts with occasional min A over 1 week Baseline:  Goal status: ONGOING  5.  Pt will use multimodal communication to answer personally relevant questions or take 2 turns in conversation with occasional min a Baseline:  Goal status: MET    LONG TERM GOALS: Target date: 04/11/23  Pt will generate 12 items in personally relevant categories with occasional min A Baseline:  Goal status: ONGOING  2.  Pt will generate moderately complex sentences in structured task such as Scientist, product/process development (VNeST) with occasional min A Baseline:  Goal status: ONGOING  3.  Pt will respond to text with simple word/phrases 3x over 1 week  occasional min A Baseline:  Goal status: ONGOING  4.  Pt will ID and correct paraphasias in structured tasks with occasional min A Baseline:  Goal status: ONGOING  5.  Pt will ID and correct aphasic errors in written expression with occasional min A Baseline:  Goal status: ONGOING  6.  Pt will improve score on Communication Participation Item Bank by 4 points Baseline: 0;  Goal status:ONGOING  ASSESSMENT:  CLINICAL IMPRESSION: Patient is a 77 y.o. 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 He continues to improve and is able to name family members, simple tools and take 4-5 turns in conversation with mod A. Eliut is starting to communicate in simple interactions in the community. He continues to rely on family and his wife to repair communication break downs and to communicate more complex information in the community. I recommend skilled ST to maximize communication for safety, independence and to reduce caregiver burden.   OBJECTIVE  IMPAIRMENTS: include aphasia. These impairments are limiting patient from return to work, household responsibilities, ADLs/IADLs, and effectively communicating at home and in community. Factors affecting potential to achieve goals and functional outcome are severity of impairments. Patient will benefit from skilled SLP services to address above impairments and improve overall function.  REHAB POTENTIAL: Good  PLAN:  SLP FREQUENCY: 2x/week  SLP DURATION: 12 weeks  PLANNED INTERVENTIONS: 92507 Treatment of speech (30 or 45 min)     [place]endale Youngblood, Radene Journey, CCC-SLP 03/14/2023, 1:47 PM

## 2023-03-19 ENCOUNTER — Encounter: Payer: Self-pay | Admitting: Speech Pathology

## 2023-03-19 ENCOUNTER — Ambulatory Visit: Payer: Medicare Other | Admitting: Speech Pathology

## 2023-03-19 DIAGNOSIS — R4701 Aphasia: Secondary | ICD-10-CM

## 2023-03-19 NOTE — Therapy (Signed)
OUTPATIENT SPEECH LANGUAGE PATHOLOGY APHASIA TREATMENT   Patient Name: Zachary [name] MRN: [id number] DOB:04/03/1946, 77 y.o., male Today's Date: 03/19/2023  PCP: Ewing Schlein, MD REFERRING PROVIDER: Loney Loh, NP  END OF SESSION:  End of Session - 03/19/23 1111     Visit Number 9    Number of Visits 25    Date for SLP Re-Evaluation 05/09/23    SLP Start Time 1100    SLP Stop Time  1145    SLP Time Calculation (min) 45 min    Activity Tolerance Patient tolerated treatment well             Past Medical History:  Diagnosis Date   Aortic valvar stenosis    Arthritis    Back pain    Erectile dysfunction    GERD (gastroesophageal reflux disease)    HTN (hypertension)    Hypercholesteremia    Urinary frequency    Past Surgical History:  Procedure Laterality Date   AORTIC VALVE REPLACEMENT N/A 03/25/2015   Procedure: AORTIC VALVE REPLACEMENT (AVR);  Surgeon: Alleen Borne, MD;  Location: [facility] OR;  Service: Open Heart Surgery;  Laterality: N/A;   CARDIAC CATHETERIZATION N/A 03/10/2015   Procedure: Right/Left Heart Cath and Coronary Angiography;  Surgeon: Lyn Records, MD;  Location: [facility] INVASIVE CV LAB;  Service: Cardiovascular;  Laterality: N/A;   CARDIAC CATHETERIZATION N/A 03/10/2015   Procedure: Intravascular Pressure Wire/FFR Study;  Surgeon: Lyn Records, MD;  Location: [facility] INVASIVE CV LAB;  Service: Cardiovascular;  Laterality: N/A;   LOOP RECORDER INSERTION N/A 02/12/2023   Procedure: LOOP RECORDER INSERTION;  Surgeon: [name], PA-C;  Location: [facility] INVASIVE CV LAB;  Service: Cardiovascular;  Laterality: N/A;   LUMBAR DISC SURGERY     TEE WITHOUT CARDIOVERSION N/A 03/25/2015   Procedure: TRANSESOPHAGEAL ECHOCARDIOGRAM (TEE);  Surgeon: Alleen Borne, MD;  Location: [facility] OR;  Service: Open Heart Surgery;  Laterality: N/A;   TONSILLECTOMY     Patient Active Problem List   Diagnosis Date Noted   Aphasia 02/09/2023   S/P AVR 03/25/2015    CAD (coronary artery disease), native coronary artery 03/10/2015   Hypertensive heart disease 03/06/2013    Class: Chronic   Hyperlipidemia 03/06/2013    Class: Chronic   GERD (gastroesophageal reflux disease) 03/06/2013    ONSET DATE: 02/09/23   REFERRING DIAG: R47.01 (ICD-10-CM) - Aphasia I63.9 (ICD-10-CM) - Acute ischemic stroke (HCC)  THERAPY DIAG:  Aphasia  Rationale for Evaluation and Treatment: Rehabilitation  SUBJECTIVE:   SUBJECTIVE STATEMENT: "This one was harder" re: homework Pt accompanied by: significant other and family member Spouse, [name]  TODAY'S TREATMENT:                                                                                                                                         DATE:   03/19/23: Zachary [name] has participated in interviewers  and questions Veterinary surgeon re: leak in shower with support from [place]. Today, he generated 8 items for personally relevant categories (tools, yard, food he can make) with occasional min A. In conversation and naming task Zachary [name] used verbal compensations for aphasia with min verbal cues to successfully communicate 4/5x.   03/14/23: Zachary [name] returns with homework of listing 15 tools - he used internet for help, he Id'd 3 tools he couldn't name until this activity. They brought in Ipad with questions re: Talk Path - demonstrated exercises (writing and speaking) and 3 activities in each. Zachary [name] completed level 3 sentence unscramble with initial min A. Targeted written expression and word finding verbalizing and writing names of friends and co-workers - Zachary [name] generated 8 names with occasional semantic cues from wife. He required frequent mod to max A to write names accurately and correct errors.   11/18/24Dorinda [name] had success ordering a car part in person. Targeted word finding and written expression in divergent naming task - he required frequent mod semantic and phrase completion cues to generate 11 basic car parts - he  required consistent mod (fill in blank) and max - (copy cues). He required max A to ID spelling and copying errors and to correct these. 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   03/07/23: Zachary [name] named his 4 siblings, 4 grandchildren and 2 daughters with supervision cues and min extended time. Targeted verbal compensations for aphasia with SFA  chart and "tell me more" level 3 on Talk Path App. Labib completed SFA chart for 7 words/photos with usual mod questioning cues and foil cues. Using SFA chart, he was able to name 5/7 items with usual mod written (fill in blank or 1st letter cues). Demonstrated 3 recommended home activities on Talk Path App.. In conversation, Zachary [name] used gestures and verbal compensations for aphasia 2/6x with frequent mod questioning cues to elicit salient descriptions.  03/05/23: Zachary [name] returns [facility] completed correctly. Targeted verbal compensations for aphasia using semantic feature analysis (SFA) generating 3-4 descriptions of common objects in structured task with extended time consistently, usual min questioning cues and semantic cues. Convergent naming task with occasional 1st letter or semantic cues - Zachary [name] named 8/10. Compensations for restaurant ordering looking up menu prior (they are going to [facility]) -  role played placing drink order with mod I, ordering main and 2 sides with occasional questioning cues. Wrote down order for him to practice prior to going out. Included strategy of writing down a car part he needs to buy and bringing in the written order to A him in requesting the part independently rather than relying on his wife. SFA HW provided.   02/28/23: Targeted naming in personally relevant categories - with frequent semantic cues he named 5 tools. With extended time and 1st letter cues he named 5 grandchildren. With extended  time and 1 semantic cue he named 5 siblings.  To target word finding and sentence generation  Scientist, product/process development (VNeST) was utilized. The pt generated 3 subjects and objects for 3 verbs, (catch, shake, read) for a total of 9 subject objects. Pt required occasional min semantic cues. Pt generated 3 complex sentences by answering "wh" questions. Pt required usual min semantic and questioning cues cues to generate complex sentences.    02/21/23: Zachary [name] and [name] brought in photos of some of his tools - naming tools  with frequent mod verbal and written cues. Generated personally relevant sentences re: tools to practice at home - see Patient Instructions. To target word finding and sentence generation  Scientist, product/process development (VNeST) was utilized. The pt generated 3 subjects and objects for 3 verbs (dive, wash, deliver), for a total of 9 subject objects. Pt required frequent mod verbal and written cues. Pt generated 3 complex sentences by answering "wh" questions. Pt required frequent mod verbal and written cues to generate complex sentences.    02/19/23: Zachary [name] and [name] bring in extended family list he has been practicing. Kacie wrote his 2 daughters' names with supervision cues and named his 5 siblings with extended pauses, but mod I. With frequent mod written (fill in blank) and semantic/phonemic cues, Calev named 10 car parts and 5 tools with extended time. He wrote the car parts with consistent max copying cues for spelling. Frequent mod A to ID aphasic errors writing,as well as errors copying. Provided aphasia ID card today.   02/14/23 (eval day): aphasia ed; review homework (write family names and locations) and categorization homework for reading and writing   PATIENT EDUCATION: Education details: See Today's Treatment; See Patient Instructions; compensations for aphasia Person educated: Patient, Spouse, and Child(ren) Education method: Explanation, Demonstration,  and Verbal cues Education comprehension: verbalized understanding, returned demonstration, verbal cues required, and needs further education   GOALS: Goals reviewed with patient? Yes  SHORT TERM GOALS: Target date: 03/14/23  Pt will name 8 items in 3 personally relevant categories with usual min A Baseline: Goal status: MET  2.  Pt will name and write 8 family members with occasional min A Baseline:  Goal status: MET  3.  Pt will generate 2-3 verbal compensations for aphasia in structured task such as Semantic Feature Analysis (SFA) with usual min to mod A Baseline:  Goal status:MET  4.  Pt will comprehend simple social texts with occasional min A over 1 week Baseline:  Goal status: NOT MET  5.  Pt will use multimodal communication to answer personally relevant questions or take 2 turns in conversation with occasional min a Baseline:  Goal status: MET    LONG TERM GOALS: Target date: 04/11/23  Pt will generate 12 items in personally relevant categories with occasional min A Baseline:  Goal status: ONGOING  2.  Pt will generate moderately complex sentences in structured task such as Scientist, product/process development (VNeST) with occasional min A Baseline:  Goal status: ONGOING  3.  Pt will respond to text with simple word/phrases 3x over 1 week  occasional min A Baseline:  Goal status: ONGOING  4.  Pt will ID and correct paraphasias in structured tasks with occasional min A Baseline:  Goal status: ONGOING  5.  Pt will ID and correct aphasic errors in written expression with occasional min A Baseline:  Goal status: ONGOING  6.  Pt will improve score on Communication Participation Item Bank by 4 points Baseline: 0;  Goal status:ONGOING  ASSESSMENT:  CLINICAL IMPRESSION: Patient is a 77 y.o. 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 He continues to improve and is able to name family members, simple tools and take 4-5  turns in conversation with mod A. Burlon is starting to communicate in simple interactions in the community. He continues to rely on family and his wife to repair communication break downs and to communicate more complex information in the community. I recommend skilled ST to maximize communication for safety, independence  and to reduce caregiver burden.   OBJECTIVE IMPAIRMENTS: include aphasia. These impairments are limiting patient from return to work, household responsibilities, ADLs/IADLs, and effectively communicating at home and in community. Factors affecting potential to achieve goals and functional outcome are severity of impairments. Patient will benefit from skilled SLP services to address above impairments and improve overall function.  REHAB POTENTIAL: Good  PLAN:  SLP FREQUENCY: 2x/week  SLP DURATION: 12 weeks  PLANNED INTERVENTIONS: 92507 Treatment of speech (30 or 45 min)     Travia Onstad, Radene Journey, CCC-SLP 03/19/2023, 11:52 AM

## 2023-03-20 ENCOUNTER — Ambulatory Visit: Payer: Medicare Other

## 2023-03-20 DIAGNOSIS — I639 Cerebral infarction, unspecified: Secondary | ICD-10-CM | POA: Diagnosis not present

## 2023-03-21 ENCOUNTER — Ambulatory Visit: Payer: Medicare Other | Admitting: Speech Pathology

## 2023-03-21 DIAGNOSIS — R4701 Aphasia: Secondary | ICD-10-CM | POA: Diagnosis not present

## 2023-03-21 NOTE — Therapy (Signed)
OUTPATIENT SPEECH LANGUAGE PATHOLOGY APHASIA TREATMENT   Patient Name: Zachary [name] MRN: [id number] DOB:July 19, 1945, 77 y.o., male Today's Date: 03/21/2023  PCP: Ewing Schlein, MD REFERRING PROVIDER: Loney Loh, NP  END OF SESSION:  End of Session - 03/21/23 1242     Visit Number 10    Number of Visits 25    Date for SLP Re-Evaluation 05/09/23    SLP Start Time 1230    SLP Stop Time  1315    SLP Time Calculation (min) 45 min    Activity Tolerance Patient tolerated treatment well             Past Medical History:  Diagnosis Date   Aortic valvar stenosis    Arthritis    Back pain    Erectile dysfunction    GERD (gastroesophageal reflux disease)    HTN (hypertension)    Hypercholesteremia    Urinary frequency    Past Surgical History:  Procedure Laterality Date   AORTIC VALVE REPLACEMENT N/A 03/25/2015   Procedure: AORTIC VALVE REPLACEMENT (AVR);  Surgeon: Alleen Borne, MD;  Location: [facility] OR;  Service: Open Heart Surgery;  Laterality: N/A;   CARDIAC CATHETERIZATION N/A 03/10/2015   Procedure: Right/Left Heart Cath and Coronary Angiography;  Surgeon: Lyn Records, MD;  Location: [facility] INVASIVE CV LAB;  Service: Cardiovascular;  Laterality: N/A;   CARDIAC CATHETERIZATION N/A 03/10/2015   Procedure: Intravascular Pressure Wire/FFR Study;  Surgeon: Lyn Records, MD;  Location: [facility] INVASIVE CV LAB;  Service: Cardiovascular;  Laterality: N/A;   LOOP RECORDER INSERTION N/A 02/12/2023   Procedure: LOOP RECORDER INSERTION;  Surgeon: [name], PA-C;  Location: [facility] INVASIVE CV LAB;  Service: Cardiovascular;  Laterality: N/A;   LUMBAR DISC SURGERY     TEE WITHOUT CARDIOVERSION N/A 03/25/2015   Procedure: TRANSESOPHAGEAL ECHOCARDIOGRAM (TEE);  Surgeon: Alleen Borne, MD;  Location: [facility] OR;  Service: Open Heart Surgery;  Laterality: N/A;   TONSILLECTOMY     Patient Active Problem List   Diagnosis Date Noted   Aphasia 02/09/2023   S/P AVR 03/25/2015    CAD (coronary artery disease), native coronary artery 03/10/2015   Hypertensive heart disease 03/06/2013    Class: Chronic   Hyperlipidemia 03/06/2013    Class: Chronic   GERD (gastroesophageal reflux disease) 03/06/2013    ONSET DATE: 02/09/23   REFERRING DIAG: R47.01 (ICD-10-CM) - Aphasia I63.9 (ICD-10-CM) - Acute ischemic stroke (HCC)  THERAPY DIAG:  Aphasia  Rationale for Evaluation and Treatment: Rehabilitation  SUBJECTIVE:   SUBJECTIVE STATEMENT: "This one was harder" re: homework Pt accompanied by: significant other and family member Spouse, [name]  TODAY'S TREATMENT:                                                                                                                                         DATE:   03/21/23: Targeted divergent naming in personally  relevant category (car makes and models) - independently named 5 - with semantic, 1st letter, and fill in blank cues - he named 15. In conversation he utilized verbal and gesture compensations for aphasia 6/8 opportunities with rare min A. He is to bring in he phone next sessions to target texts. He successfully verbalized 4 opinions and 4 rationale for electric cars, self driving cars, jeeps, and self parking cars with rare min questioning cues/requests for clarification.   03/19/23: Kore has participated in interviewers and questions plumber and contractor re: leak in shower with support from [place]. Today, he generated 8 items for personally relevant categories (tools, yard, food he can make) with occasional min A. In conversation and naming task Berge used verbal compensations for aphasia with min verbal cues to successfully communicate 4/5x.   03/14/23: Markis returns with homework of listing 15 tools - he used internet for help, he Id'd 3 tools he couldn't name until this activity. They brought in Ipad with questions re: Talk Path - demonstrated exercises (writing and speaking) and 3 activities in each. Kelin  completed level 3 sentence unscramble with initial min A. Targeted written expression and word finding verbalizing and writing names of friends and co-workers - [name] generated 8 names with occasional semantic cues from wife. He required frequent mod to max A to write names accurately and correct errors.   11/18/24[name] had success ordering a car part in person. Targeted word finding and written expression in divergent naming task - he required frequent mod semantic and phrase completion cues to generate 11 basic car parts - he required consistent mod (fill in blank) and max - (copy cues). He required max A to ID spelling and copying errors and to correct these. 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   03/07/23: [name] named his 4 siblings, 4 grandchildren and 2 daughters with supervision cues and min extended time. Targeted verbal compensations for aphasia with SFA  chart and "tell me more" level 3 on Talk Path App. Keval completed SFA chart for 7 words/photos with usual mod questioning cues and foil cues. Using SFA chart, he was able to name 5/7 items with usual mod written (fill in blank or 1st letter cues). Demonstrated 3 recommended home activities on Talk Path App.. In conversation, Langdon used gestures and verbal compensations for aphasia 2/6x with frequent mod questioning cues to elicit salient descriptions.  03/05/23: Atwell returns [facility] completed correctly. Targeted verbal compensations for aphasia using semantic feature analysis (SFA) generating 3-4 descriptions of common objects in structured task with extended time consistently, usual min questioning cues and semantic cues. Convergent naming task with occasional 1st letter or semantic cues - Mckenzie named 8/10. Compensations for restaurant ordering looking up menu prior (they are going to [facility]) -  role  played placing drink order with mod I, ordering main and 2 sides with occasional questioning cues. Wrote down order for him to practice prior to going out. Included strategy of writing down a car part he needs to buy and bringing in the written order to A him in requesting the part independently rather than relying on his wife. SFA HW provided.   02/28/23: Targeted naming in personally relevant categories - with frequent semantic cues he named 5 tools. With extended time and 1st letter cues he named 5 grandchildren. With extended time and 1 semantic cue  he named 5 siblings.  To target word finding and sentence generation  Scientist, product/process development (VNeST) was utilized. The pt generated 3 subjects and objects for 3 verbs, (catch, shake, read) for a total of 9 subject objects. Pt required occasional min semantic cues. Pt generated 3 complex sentences by answering "wh" questions. Pt required usual min semantic and questioning cues cues to generate complex sentences.    02/21/23: [name] and [name] brought in photos of some of his tools - naming tools with frequent mod verbal and written cues. Generated personally relevant sentences re: tools to practice at home - see Patient Instructions. To target word finding and sentence generation  Scientist, product/process development (VNeST) was utilized. The pt generated 3 subjects and objects for 3 verbs (dive, wash, deliver), for a total of 9 subject objects. Pt required frequent mod verbal and written cues. Pt generated 3 complex sentences by answering "wh" questions. Pt required frequent mod verbal and written cues to generate complex sentences.    02/19/23: [name] and [name] bring in extended family list he has been practicing. Decorey wrote his 2 daughters' names with supervision cues and named his 5 siblings with extended pauses, but mod I. With frequent mod written (fill in blank) and semantic/phonemic cues, Cassel named 10 car parts and 5 tools with  extended time. He wrote the car parts with consistent max copying cues for spelling. Frequent mod A to ID aphasic errors writing,as well as errors copying. Provided aphasia ID card today.   02/14/23 (eval day): aphasia ed; review homework (write family names and locations) and categorization homework for reading and writing   PATIENT EDUCATION: Education details: See Today's Treatment; See Patient Instructions; compensations for aphasia Person educated: Patient, Spouse, and Child(ren) Education method: Explanation, Demonstration, and Verbal cues Education comprehension: verbalized understanding, returned demonstration, verbal cues required, and needs further education   GOALS: Goals reviewed with patient? Yes  SHORT TERM GOALS: Target date: 03/14/23  Pt will name 8 items in 3 personally relevant categories with usual min A Baseline: Goal status: MET  2.  Pt will name and write 8 family members with occasional min A Baseline:  Goal status: MET  3.  Pt will generate 2-3 verbal compensations for aphasia in structured task such as Semantic Feature Analysis (SFA) with usual min to mod A Baseline:  Goal status:MET  4.  Pt will comprehend simple social texts with occasional min A over 1 week Baseline:  Goal status: NOT MET  5.  Pt will use multimodal communication to answer personally relevant questions or take 2 turns in conversation with occasional min a Baseline:  Goal status: MET    LONG TERM GOALS: Target date: 04/11/23  Pt will generate 12 items in personally relevant categories with occasional min A Baseline:  Goal status: ONGOING  2.  Pt will generate moderately complex sentences in structured task such as Scientist, product/process development (VNeST) with occasional min A Baseline:  Goal status: ONGOING  3.  Pt will respond to text with simple word/phrases 3x over 1 week  occasional min A Baseline:  Goal status: ONGOING  4.  Pt will ID and correct paraphasias in  structured tasks with occasional min A Baseline:  Goal status: ONGOING  5.  Pt will ID and correct aphasic errors in written expression with occasional min A Baseline:  Goal status: ONGOING  6.  Pt will improve score on Communication Participation Item Bank by 4 points Baseline: 0;  Goal status:ONGOING  ASSESSMENT:  CLINICAL IMPRESSION: Patient is a 77 y.o. 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 He continues to improve and is able to name family members, simple tools and take 5-7 turns in conversation with occasional min A. Carmello is starting to communicate in simple interactions in the community. He continues to rely on family and his wife to repair communication break downs and to communicate more complex information in the community. He continues to demonstrate difficulty naming his tools and car parts when working in the shop and ordering parts. I recommend skilled ST to maximize communication for safety, independence and to reduce caregiver burden.   OBJECTIVE IMPAIRMENTS: include aphasia. These impairments are limiting patient from return to work, household responsibilities, ADLs/IADLs, and effectively communicating at home and in community. Factors affecting potential to achieve goals and functional outcome are severity of impairments. Patient will benefit from skilled SLP services to address above impairments and improve overall function.  REHAB POTENTIAL: Good  PLAN:  SLP FREQUENCY: 2x/week  SLP DURATION: 12 weeks  PLANNED INTERVENTIONS: 92507 Treatment of speech (30 or 45 min)   Speech Therapy Progress Note  Dates of Reporting Period: 02/14/23 to 03/21/23  Objective Reports of Subjective Statement: He is using compensations to take 5-7 turns in conversations - providing 2-3 details for each utterance/opinion  Objective Measurements: See goals above  Goal Update: Continue goals  Plan: continue POC  Reason Skilled Services  are Required: Aphasia continues to affect Trew's ability to communicate needs, specific car parts, tools he uses or needs to order for his hobby.   Campbell Agramonte, Radene Journey, CCC-SLP 03/21/2023, 12:43 PM

## 2023-03-23 LAB — CUP PACEART REMOTE DEVICE CHECK
Date Time Interrogation Session: 20241126205708
Implantable Pulse Generator Implant Date: 20241021

## 2023-03-26 ENCOUNTER — Ambulatory Visit: Payer: Medicare Other | Attending: Family Medicine | Admitting: Speech Pathology

## 2023-03-26 ENCOUNTER — Encounter: Payer: Self-pay | Admitting: Speech Pathology

## 2023-03-26 DIAGNOSIS — R4701 Aphasia: Secondary | ICD-10-CM | POA: Insufficient documentation

## 2023-03-26 NOTE — Therapy (Signed)
OUTPATIENT SPEECH LANGUAGE PATHOLOGY APHASIA TREATMENT   Patient Name: Zachary [name] MRN: [id number] DOB:10-28-1945, 77 y.o., male Today's Date: 03/26/2023  PCP: Ewing Schlein, MD REFERRING PROVIDER: Loney Loh, NP  END OF SESSION:  End of Session - 03/26/23 1344     Visit Number 11    Number of Visits 25    Date for SLP Re-Evaluation 05/09/23    SLP Start Time 1315    SLP Stop Time  1400    SLP Time Calculation (min) 45 min    Activity Tolerance Patient tolerated treatment well             Past Medical History:  Diagnosis Date   Aortic valvar stenosis    Arthritis    Back pain    Erectile dysfunction    GERD (gastroesophageal reflux disease)    HTN (hypertension)    Hypercholesteremia    Urinary frequency    Past Surgical History:  Procedure Laterality Date   AORTIC VALVE REPLACEMENT N/A 03/25/2015   Procedure: AORTIC VALVE REPLACEMENT (AVR);  Surgeon: Alleen Borne, MD;  Location[facility] OR;  Service: Open Heart Surgery;  Laterality: N/A;   CARDIAC CATHETERIZATION N/A 03/10/2015   Procedure: Right/Left Heart Cath and Coronary Angiography;  Surgeon: Lyn Records, MD;  Location[facility] INVASIVE CV LAB;  Service: Cardiovascular;  Laterality: N/A;   CARDIAC CATHETERIZATION N/A 03/10/2015   Procedure: Intravascular Pressure Wire/FFR Study;  Surgeon: Lyn Records, MD;  Location: [facility] INVASIVE CV LAB;  Service: Cardiovascular;  Laterality: N/A;   LOOP RECORDER INSERTION N/A 02/12/2023   Procedure: LOOP RECORDER INSERTION;  Surgeon: [name], PA-C;  Location: [facility] INVASIVE CV LAB;  Service: Cardiovascular;  Laterality: N/A;   LUMBAR DISC SURGERY     TEE WITHOUT CARDIOVERSION N/A 03/25/2015   Procedure: TRANSESOPHAGEAL ECHOCARDIOGRAM (TEE);  Surgeon: Alleen Borne, MD;  Location: [facility] OR;  Service: Open Heart Surgery;  Laterality: N/A;   TONSILLECTOMY     Patient Active Problem List   Diagnosis Date Noted   Aphasia 02/09/2023   S/P AVR 03/25/2015    CAD (coronary artery disease), native coronary artery 03/10/2015   Hypertensive heart disease 03/06/2013    Class: Chronic   Hyperlipidemia 03/06/2013    Class: Chronic   GERD (gastroesophageal reflux disease) 03/06/2013    ONSET DATE: 02/09/23   REFERRING DIAG: R47.01 (ICD-10-CM) - Aphasia I63.9 (ICD-10-CM) - Acute ischemic stroke (HCC)  THERAPY DIAG:  Aphasia  Rationale for Evaluation and Treatment: Rehabilitation  SUBJECTIVE:   SUBJECTIVE STATEMENT: "This one was harder" re: homework Pt accompanied by: significant other and family member Spouse, [name]  TODAY'S TREATMENT:                                                                                                                                         DATE:    03/26/23: Targeted responding using text  messaging - [name] required mod A to locate messages initially,then with occasional min extended time. With frequent mod A to use back space to correct errors and to use autofill as needed. He responded to work friends. [name] responded to my 3 texts with extended time and occasional min verbal and visual cues. In conversation, [name] spontaneously used gestures and verbal compensations to communicate 3 items he uses on his Christmas tree in conversation.   03/21/23: Targeted divergent naming in personally relevant category (car makes and models) - independently named 5 - with semantic, 1st letter, and fill in blank cues - he named 15. In conversation he utilized verbal and gesture compensations for aphasia 6/8 opportunities with rare min A. He is to bring in he phone next sessions to target texts. He successfully verbalized 4 opinions and 4 rationale for electric cars, self driving cars, jeeps, and self parking cars with rare min questioning cues/requests for clarification.   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 Today, he generated 8 items  for personally relevant categories (tools, yard, food he can make) with occasional min A. In conversation and naming task Antrell used verbal compensations for aphasia with min verbal cues to successfully communicate 4/5x.   03/14/23: Joshuah returns with homework of listing 15 tools - he used internet for help, he Id'd 3 tools he couldn't name until this activity. They brought in Ipad with questions re: Talk Path - demonstrated exercises (writing and speaking) and 3 activities in each. Richie completed level 3 sentence unscramble with initial min A. Targeted written expression and word finding verbalizing and writing names of friends and co-workers - [name] generated 8 names with occasional semantic cues from wife. He required frequent mod to max A to write names accurately and correct errors.   11/18/24[name] had success ordering a car part in person. Targeted word finding and written expression in divergent naming task - he required frequent mod semantic and phrase completion cues to generate 11 basic car parts - he required consistent mod (fill in blank) and max - (copy cues). He required max A to ID spelling and copying errors and to correct these. 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   03/07/23: [name] named his 4 siblings, 4 grandchildren and 2 daughters with supervision cues and min extended time. Targeted verbal compensations for aphasia with SFA  chart and "tell me more" level 3 on Talk Path App. Zaim completed SFA chart for 7 words/photos with usual mod questioning cues and foil cues. Using SFA chart, he was able to name 5/7 items with usual mod written (fill in blank or 1st letter cues). Demonstrated 3 recommended home activities on Talk Path App.. In conversation, Perry used gestures and verbal compensations for aphasia 2/6x with frequent mod questioning cues to elicit  salient descriptions.  03/05/23: Elhadji returns [facility] completed correctly. Targeted verbal compensations for aphasia using semantic feature analysis (SFA) generating 3-4 descriptions of common objects in structured task with extended time consistently, usual min questioning cues and semantic cues. Convergent naming task with occasional 1st letter or semantic cues - Hudsen named 8/10. Compensations for restaurant ordering looking up menu prior (they are going to [facility]) -  role played placing drink order with mod I, ordering main and 2 sides with occasional questioning cues.  Wrote down order for him to practice prior to going out. Included strategy of writing down a car part he needs to buy and bringing in the written order to A him in requesting the part independently rather than relying on his wife. SFA HW provided.   02/28/23: Targeted naming in personally relevant categories - with frequent semantic cues he named 5 tools. With extended time and 1st letter cues he named 5 grandchildren. With extended time and 1 semantic cue he named 5 siblings.  To target word finding and sentence generation  Scientist, product/process development (VNeST) was utilized. The pt generated 3 subjects and objects for 3 verbs, (catch, shake, read) for a total of 9 subject objects. Pt required occasional min semantic cues. Pt generated 3 complex sentences by answering "wh" questions. Pt required usual min semantic and questioning cues cues to generate complex sentences.    02/21/23: [name] and [name] brought in photos of some of his tools - naming tools with frequent mod verbal and written cues. Generated personally relevant sentences re: tools to practice at home - see Patient Instructions. To target word finding and sentence generation  Scientist, product/process development (VNeST) was utilized. The pt generated 3 subjects and objects for 3 verbs (dive, wash, deliver), for a total of 9 subject objects. Pt required  frequent mod verbal and written cues. Pt generated 3 complex sentences by answering "wh" questions. Pt required frequent mod verbal and written cues to generate complex sentences.    02/19/23: [name] and [name] bring in extended family list he has been practicing. Diane wrote his 2 daughters' names with supervision cues and named his 5 siblings with extended pauses, but mod I. With frequent mod written (fill in blank) and semantic/phonemic cues, Taisto named 10 car parts and 5 tools with extended time. He wrote the car parts with consistent max copying cues for spelling. Frequent mod A to ID aphasic errors writing,as well as errors copying. Provided aphasia ID card today.   02/14/23 (eval day): aphasia ed; review homework (write family names and locations) and categorization homework for reading and writing   PATIENT EDUCATION: Education details: See Today's Treatment; See Patient Instructions; compensations for aphasia Person educated: Patient, Spouse, and Child(ren) Education method: Explanation, Demonstration, and Verbal cues Education comprehension: verbalized understanding, returned demonstration, verbal cues required, and needs further education   GOALS: Goals reviewed with patient? Yes  SHORT TERM GOALS: Target date: 03/14/23  Pt will name 8 items in 3 personally relevant categories with usual min A Baseline: Goal status: MET  2.  Pt will name and write 8 family members with occasional min A Baseline:  Goal status: MET  3.  Pt will generate 2-3 verbal compensations for aphasia in structured task such as Semantic Feature Analysis (SFA) with usual min to mod A Baseline:  Goal status:MET  4.  Pt will comprehend simple social texts with occasional min A over 1 week Baseline:  Goal status: NOT MET  5.  Pt will use multimodal communication to answer personally relevant questions or take 2 turns in conversation with occasional min a Baseline:  Goal status: MET    LONG TERM  GOALS: Target date: 04/11/23  Pt will generate 12 items in personally relevant categories with occasional min A Baseline:  Goal status: ONGOING  2.  Pt will generate moderately complex sentences in structured task such as Scientist, product/process development (VNeST) with occasional min A Baseline:  Goal status: ONGOING  3.  Pt will respond to  text with simple word/phrases 3x over 1 week  occasional min A Baseline:  Goal status: ONGOING  4.  Pt will ID and correct paraphasias in structured tasks with occasional min A Baseline:  Goal status: ONGOING  5.  Pt will ID and correct aphasic errors in written expression with occasional min A Baseline:  Goal status: ONGOING  6.  Pt will improve score on Communication Participation Item Bank by 4 points Baseline: 0;  Goal status:ONGOING  ASSESSMENT:  CLINICAL IMPRESSION: Patient is a 77 y.o. 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 He continues to improve and is able to name family members, simple tools and take 5-7 turns in conversation with occasional min A. Michaelryan is starting to communicate in simple interactions in the community. He continues to rely on family and his wife to repair communication break downs and to communicate more complex information in the community. He continues to demonstrate difficulty naming his tools and car parts when working in the shop and ordering parts. I recommend skilled ST to maximize communication for safety, independence and to reduce caregiver burden.   OBJECTIVE IMPAIRMENTS: include aphasia. These impairments are limiting patient from return to work, household responsibilities, ADLs/IADLs, and effectively communicating at home and in community. Factors affecting potential to achieve goals and functional outcome are severity of impairments. Patient will benefit from skilled SLP services to address above impairments and improve overall function.  REHAB POTENTIAL:  Good  PLAN:  SLP FREQUENCY: 2x/week  SLP DURATION: 12 weeks  PLANNED INTERVENTIONS: 92507 Treatment of speech (30 or 45 min)    Amandine Covino, Radene Journey, CCC-SLP 03/26/2023, 2:51 PM

## 2023-03-28 ENCOUNTER — Encounter: Payer: Self-pay | Admitting: Speech Pathology

## 2023-03-28 ENCOUNTER — Ambulatory Visit: Payer: Medicare Other | Admitting: Speech Pathology

## 2023-03-28 DIAGNOSIS — R4701 Aphasia: Secondary | ICD-10-CM

## 2023-03-28 NOTE — Therapy (Signed)
OUTPATIENT SPEECH LANGUAGE PATHOLOGY APHASIA TREATMENT   Patient Name: Zachary [name] MRN: [id number] DOB:1945-04-30, 78 y.o., male Today's Date: 03/28/2023  PCP: Ewing Schlein, MD REFERRING PROVIDER: Loney Loh, NP  END OF SESSION:  End of Session - 03/28/23 1106     Visit Number 12    Number of Visits 25    Date for SLP Re-Evaluation 05/09/23    SLP Start Time 1100    SLP Stop Time  1145    SLP Time Calculation (min) 45 min    Activity Tolerance Patient tolerated treatment well             Past Medical History:  Diagnosis Date   Aortic valvar stenosis    Arthritis    Back pain    Erectile dysfunction    GERD (gastroesophageal reflux disease)    HTN (hypertension)    Hypercholesteremia    Urinary frequency    Past Surgical History:  Procedure Laterality Date   AORTIC VALVE REPLACEMENT N/A 03/25/2015   Procedure: AORTIC VALVE REPLACEMENT (AVR);  Surgeon: Alleen Borne, MD;  Location: [facility] OR;  Service: Open Heart Surgery;  Laterality: N/A;   CARDIAC CATHETERIZATION N/A 03/10/2015   Procedure: Right/Left Heart Cath and Coronary Angiography;  Surgeon: Lyn Records, MD;  Location: [facility] INVASIVE CV LAB;  Service: Cardiovascular;  Laterality: N/A;   CARDIAC CATHETERIZATION N/A 03/10/2015   Procedure: Intravascular Pressure Wire/FFR Study;  Surgeon: Lyn Records, MD;  Location: [facility] INVASIVE CV LAB;  Service: Cardiovascular;  Laterality: N/A;   LOOP RECORDER INSERTION N/A 02/12/2023   Procedure: LOOP RECORDER INSERTION;  Surgeon: [name], PA-C;  Location: [facility] INVASIVE CV LAB;  Service: Cardiovascular;  Laterality: N/A;   LUMBAR DISC SURGERY     TEE WITHOUT CARDIOVERSION N/A 03/25/2015   Procedure: TRANSESOPHAGEAL ECHOCARDIOGRAM (TEE);  Surgeon: Alleen Borne, MD;  Location: [facility] OR;  Service: Open Heart Surgery;  Laterality: N/A;   TONSILLECTOMY     Patient Active Problem List   Diagnosis Date Noted   Aphasia 02/09/2023   S/P AVR 03/25/2015    CAD (coronary artery disease), native coronary artery 03/10/2015   Hypertensive heart disease 03/06/2013    Class: Chronic   Hyperlipidemia 03/06/2013    Class: Chronic   GERD (gastroesophageal reflux disease) 03/06/2013    ONSET DATE: 02/09/23   REFERRING DIAG: R47.01 (ICD-10-CM) - Aphasia I63.9 (ICD-10-CM) - Acute ischemic stroke (HCC)  THERAPY DIAG:  Aphasia  Rationale for Evaluation and Treatment: Rehabilitation  SUBJECTIVE:   SUBJECTIVE STATEMENT: "This one was harder" re: homework Pt accompanied by: significant other and family member Spouse, [name]  TODAY'S TREATMENT:                                                                                                                                         DATE:   03/28/23: [name] demonstrated use of photo  app on phone and independently opens app and locates a photo. Targeted written expression in texting - today, he located text app independently and responded to my text with extended time and rare min A. To target word finding and sentence generation  Scientist, product/process development (VNeST) was utilized. The pt generated 3 subjects and objects for 4 verbs (send, pass, measure, buy), for a total of 12 subject objects. Pt required occasional min cues. Pt generated 4 complex sentences by answering "wh" questions. Pt required occasional min semantic cues cues to generate complex sentences.    03/26/23: Targeted responding using text messaging - [name] required mod A to locate messages initially,then with occasional min extended time. With frequent mod A to use back space to correct errors and to use autofill as needed. He responded to work friends. [name] responded to my 3 texts with extended time and occasional min verbal and visual cues. In conversation, [name] spontaneously used gestures and verbal compensations to communicate 3 items he uses on his Christmas tree in conversation.   03/21/23: Targeted divergent naming in personally  relevant category (car makes and models) - independently named 5 - with semantic, 1st letter, and fill in blank cues - he named 15. In conversation he utilized verbal and gesture compensations for aphasia 6/8 opportunities with rare min A. He is to bring in he phone next sessions to target texts. He successfully verbalized 4 opinions and 4 rationale for electric cars, self driving cars, jeeps, and self parking cars with rare min questioning cues/requests for clarification.   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 Today, he generated 8 items for personally relevant categories (tools, yard, food he can make) with occasional min A. In conversation and naming task Markevius used verbal compensations for aphasia with min verbal cues to successfully communicate 4/5x.   03/14/23: Jeovany returns with homework of listing 15 tools - he used internet for help, he Id'd 3 tools he couldn't name until this activity. They brought in Ipad with questions re: Talk Path - demonstrated exercises (writing and speaking) and 3 activities in each. Sallie completed level 3 sentence unscramble with initial min A. Targeted written expression and word finding verbalizing and writing names of friends and co-workers - [name] generated 8 names with occasional semantic cues from wife. He required frequent mod to max A to write names accurately and correct errors.   11/18/24[name] had success ordering a car part in person. Targeted word finding and written expression in divergent naming task - he required frequent mod semantic and phrase completion cues to generate 11 basic car parts - he required consistent mod (fill in blank) and max - (copy cues). He required max A to ID spelling and copying errors and to correct these. 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   03/07/23: [name] named his 4 siblings, 4 grandchildren and 2 daughters with supervision cues and min extended time. Targeted verbal compensations for aphasia with SFA  chart and "tell me more" level 3 on Talk Path App. Jaimes completed SFA chart for 7 words/photos with usual mod questioning cues and foil cues. Using SFA chart, he was able to name 5/7 items with usual mod written (fill in blank or 1st letter cues). Demonstrated 3 recommended home activities on Talk Path App.. In conversation,  Biran used gestures and verbal compensations for aphasia 2/6x with frequent mod questioning cues to elicit salient descriptions.  03/05/23: Tate returns [facility] completed correctly. Targeted verbal compensations for aphasia using semantic feature analysis (SFA) generating 3-4 descriptions of common objects in structured task with extended time consistently, usual min questioning cues and semantic cues. Convergent naming task with occasional 1st letter or semantic cues - Caros named 8/10. Compensations for restaurant ordering looking up menu prior (they are going to [facility]) -  role played placing drink order with mod I, ordering main and 2 sides with occasional questioning cues. Wrote down order for him to practice prior to going out. Included strategy of writing down a car part he needs to buy and bringing in the written order to A him in requesting the part independently rather than relying on his wife. SFA HW provided.   02/28/23: Targeted naming in personally relevant categories - with frequent semantic cues he named 5 tools. With extended time and 1st letter cues he named 5 grandchildren. With extended time and 1 semantic cue he named 5 siblings.  To target word finding and sentence generation  Scientist, product/process development (VNeST) was utilized. The pt generated 3 subjects and objects for 3 verbs, (catch, shake, read) for a total of 9 subject objects. Pt required  occasional min semantic cues. Pt generated 3 complex sentences by answering "wh" questions. Pt required usual min semantic and questioning cues cues to generate complex sentences.    02/21/23: [name] and [name] brought in photos of some of his tools - naming tools with frequent mod verbal and written cues. Generated personally relevant sentences re: tools to practice at home - see Patient Instructions. To target word finding and sentence generation  Scientist, product/process development (VNeST) was utilized. The pt generated 3 subjects and objects for 3 verbs (dive, wash, deliver), for a total of 9 subject objects. Pt required frequent mod verbal and written cues. Pt generated 3 complex sentences by answering "wh" questions. Pt required frequent mod verbal and written cues to generate complex sentences.    02/19/23: [name] and [name] bring in extended family list he has been practicing. Raydell wrote his 2 daughters' names with supervision cues and named his 5 siblings with extended pauses, but mod I. With frequent mod written (fill in blank) and semantic/phonemic cues, Kobi named 10 car parts and 5 tools with extended time. He wrote the car parts with consistent max copying cues for spelling. Frequent mod A to ID aphasic errors writing,as well as errors copying. Provided aphasia ID card today.   02/14/23 (eval day): aphasia ed; review homework (write family names and locations) and categorization homework for reading and writing   PATIENT EDUCATION: Education details: See Today's Treatment; See Patient Instructions; compensations for aphasia Person educated: Patient, Spouse, and Child(ren) Education method: Explanation, Demonstration, and Verbal cues Education comprehension: verbalized understanding, returned demonstration, verbal cues required, and needs further education   GOALS: Goals reviewed with patient? Yes  SHORT TERM GOALS: Target date: 03/14/23  Pt will name 8 items in 3 personally  relevant categories with usual min A Baseline: Goal status: MET  2.  Pt will name and write 8 family members with occasional min A Baseline:  Goal status: MET  3.  Pt will generate 2-3 verbal compensations for aphasia in structured task such as Semantic Feature Analysis (SFA) with usual min to mod A Baseline:  Goal status:MET  4.  Pt will comprehend simple social texts  with occasional min A over 1 week Baseline:  Goal status: NOT MET  5.  Pt will use multimodal communication to answer personally relevant questions or take 2 turns in conversation with occasional min a Baseline:  Goal status: MET    LONG TERM GOALS: Target date: 04/11/23  Pt will generate 12 items in personally relevant categories with occasional min A Baseline:  Goal status: ONGOING  2.  Pt will generate moderately complex sentences in structured task such as Scientist, product/process development (VNeST) with occasional min A Baseline:  Goal status: MET  3.  Pt will respond to text with simple word/phrases 3x over 1 week  occasional min A Baseline:  Goal status: ONGOING  4.  Pt will ID and correct paraphasias in structured tasks with occasional min A Baseline:  Goal status: ONGOING  5.  Pt will ID and correct aphasic errors in written expression with occasional min A Baseline:  Goal status: ONGOING  6.  Pt will improve score on Communication Participation Item Bank by 4 points Baseline: 0;  Goal status:ONGOING  ASSESSMENT:  CLINICAL IMPRESSION: Patient is a 77 y.o. 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 He continues to improve and is able to name family members, simple tools and take 5-7 turns in conversation with occasional min A. Majour is starting to communicate in simple interactions in the community. He continues to rely on family and his wife to repair communication break downs and to communicate more complex information in the community. He continues to  demonstrate difficulty naming his tools and car parts when working in the shop and ordering parts. I recommend skilled ST to maximize communication for safety, independence and to reduce caregiver burden.   OBJECTIVE IMPAIRMENTS: include aphasia. These impairments are limiting patient from return to work, household responsibilities, ADLs/IADLs, and effectively communicating at home and in community. Factors affecting potential to achieve goals and functional outcome are severity of impairments. Patient will benefit from skilled SLP services to address above impairments and improve overall function.  REHAB POTENTIAL: Good  PLAN:  SLP FREQUENCY: 2x/week  SLP DURATION: 12 weeks  PLANNED INTERVENTIONS: 92507 Treatment of speech (30 or 45 min)    Niomie Englert, Radene Journey, CCC-SLP 03/28/2023, 11:57 AM

## 2023-04-02 ENCOUNTER — Ambulatory Visit: Payer: Medicare Other | Admitting: Speech Pathology

## 2023-04-02 ENCOUNTER — Encounter: Payer: Self-pay | Admitting: Speech Pathology

## 2023-04-02 DIAGNOSIS — R4701 Aphasia: Secondary | ICD-10-CM

## 2023-04-02 NOTE — Therapy (Signed)
OUTPATIENT SPEECH LANGUAGE PATHOLOGY APHASIA TREATMENT   Patient Name: Zachary [name] MRN: [id number] DOB:04-Jun-1945, 77 y.o., male Today's Date: 04/02/2023  PCP: Ewing Schlein, MD REFERRING PROVIDER: Loney Loh, NP  END OF SESSION:  End of Session - 04/02/23 1321     Visit Number 13    Number of Visits 25    Date for SLP Re-Evaluation 05/09/23    SLP Start Time 1315    SLP Stop Time  1400    SLP Time Calculation (min) 45 min    Activity Tolerance Patient tolerated treatment well             Past Medical History:  Diagnosis Date   Aortic valvar stenosis    Arthritis    Back pain    Erectile dysfunction    GERD (gastroesophageal reflux disease)    HTN (hypertension)    Hypercholesteremia    Urinary frequency    Past Surgical History:  Procedure Laterality Date   AORTIC VALVE REPLACEMENT N/A 03/25/2015   Procedure: AORTIC VALVE REPLACEMENT (AVR);  Surgeon: Alleen Borne, MD;  Location: [facility] OR;  Service: Open Heart Surgery;  Laterality: N/A;   CARDIAC CATHETERIZATION N/A 03/10/2015   Procedure: Right/Left Heart Cath and Coronary Angiography;  Surgeon: Lyn Records, MD;  Location: [facility] INVASIVE CV LAB;  Service: Cardiovascular;  Laterality: N/A;   CARDIAC CATHETERIZATION N/A 03/10/2015   Procedure: Intravascular Pressure Wire/FFR Study;  Surgeon: Lyn Records, MD;  Location: [facility] INVASIVE CV LAB;  Service: Cardiovascular;  Laterality: N/A;   LOOP RECORDER INSERTION N/A 02/12/2023   Procedure: LOOP RECORDER INSERTION;  Surgeon: [name], PA-C;  Location: [facility] INVASIVE CV LAB;  Service: Cardiovascular;  Laterality: N/A;   LUMBAR DISC SURGERY     TEE WITHOUT CARDIOVERSION N/A 03/25/2015   Procedure: TRANSESOPHAGEAL ECHOCARDIOGRAM (TEE);  Surgeon: Alleen Borne, MD;  Location: [facility] OR;  Service: Open Heart Surgery;  Laterality: N/A;   TONSILLECTOMY     Patient Active Problem List   Diagnosis Date Noted   Aphasia 02/09/2023   S/P AVR 03/25/2015    CAD (coronary artery disease), native coronary artery 03/10/2015   Hypertensive heart disease 03/06/2013    Class: Chronic   Hyperlipidemia 03/06/2013    Class: Chronic   GERD (gastroesophageal reflux disease) 03/06/2013    ONSET DATE: 02/09/23   REFERRING DIAG: R47.01 (ICD-10-CM) - Aphasia I63.9 (ICD-10-CM) - Acute ischemic stroke (HCC)  THERAPY DIAG:  Aphasia  Rationale for Evaluation and Treatment: Rehabilitation  SUBJECTIVE:   SUBJECTIVE STATEMENT: "This one was harder" re: homework Pt accompanied by: significant other and family member Spouse, [name]  TODAY'S TREATMENT:                                                                                                                                         DATE:   04/02/23: Zachary [name] has successfully typed in  and ordered car parts independently. Targeted divergent naming and written in season category - Zachary [name] wrote 20 Christmas words with usual min semantic cues. Frequent max A to correct written errors. Targeted verbal compensations for aphasia generating 3 similarities and differences to 2 pairs or related words with rare min questioning cues  03/28/23: Zachary [name] demonstrated use of photo app on phone and independently opens app and locates a photo. Targeted written expression in texting - today, he located text app independently and responded to my text with extended time and rare min A. To target word finding and sentence generation  Scientist, product/process development (VNeST) was utilized. The pt generated 3 subjects and objects for 4 verbs (send, pass, measure, buy), for a total of 12 subject objects. Pt required occasional min cues. Pt generated 4 complex sentences by answering "wh" questions. Pt required occasional min semantic cues cues to generate complex sentences.    03/26/23: Targeted responding using text messaging - Zachary [name] required mod A to locate messages initially,then with occasional min extended time. With frequent mod A to  use back space to correct errors and to use autofill as needed. He responded to work friends. Zachary [name] responded to my 3 texts with extended time and occasional min verbal and visual cues. In conversation, Zachary [name] spontaneously used gestures and verbal compensations to communicate 3 items he uses on his Christmas tree in conversation.   03/21/23: Targeted divergent naming in personally relevant category (car makes and models) - independently named 5 - with semantic, 1st letter, and fill in blank cues - he named 15. In conversation he utilized verbal and gesture compensations for aphasia 6/8 opportunities with rare min A. He is to bring in he phone next sessions to target texts. He successfully verbalized 4 opinions and 4 rationale for electric cars, self driving cars, jeeps, and self parking cars with rare min questioning cues/requests for clarification.   03/19/23: [name] has participated in interviewers and questions plumber and contractor re: leak in shower with support from Zachary [name]. Today, he generated 8 items for personally relevant categories (tools, yard, food he can make) with occasional min A. In conversation and naming task Zachary [name] used verbal compensations for aphasia with min verbal cues to successfully communicate 4/5x.   03/14/23: Shadrack returns with homework of listing 15 tools - he used internet for help, he Id'd 3 tools he couldn't name until this activity. They brought in Ipad with questions re: Talk Path - demonstrated exercises (writing and speaking) and 3 activities in each. Zachary [name] completed level 3 sentence unscramble with initial min A. Targeted written expression and word finding verbalizing and writing names of friends and co-workers - Zachary [name] generated 8 names with occasional semantic cues from wife. He required frequent mod to max A to write names accurately and correct errors.   11/18/24Dorinda [name] had success ordering a car part in person. Targeted word finding and written expression in  divergent naming task - he required frequent mod semantic and phrase completion cues to generate 11 basic car parts - he required consistent mod (fill in blank) and max - (copy cues). He required max A to ID spelling and copying errors and to correct these. 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   03/07/23: Zachary [name] named his 4 siblings, 4 grandchildren and 2 daughters with supervision cues and min extended time. Targeted verbal compensations for aphasia with SFA  chart and "tell me more" level 3 on Talk Path App. Selmer completed SFA chart for 7 words/photos with usual mod questioning cues and foil cues. Using SFA chart, he was able to name 5/7 items with usual mod written (fill in blank or 1st letter cues). Demonstrated 3 recommended home activities on Talk Path App.. In conversation, Brylen used gestures and verbal compensations for aphasia 2/6x with frequent mod questioning cues to elicit salient descriptions.  03/05/23: Jarion returns [facility] completed correctly. Targeted verbal compensations for aphasia using semantic feature analysis (SFA) generating 3-4 descriptions of common objects in structured task with extended time consistently, usual min questioning cues and semantic cues. Convergent naming task with occasional 1st letter or semantic cues - Anias named 8/10. Compensations for restaurant ordering looking up menu prior (they are going to [facility]) -  role played placing drink order with mod I, ordering main and 2 sides with occasional questioning cues. Wrote down order for him to practice prior to going out. Included strategy of writing down a car part he needs to buy and bringing in the written order to A him in requesting the part independently rather than relying on his wife. SFA HW provided.   02/28/23: Targeted naming in personally relevant categories - with  frequent semantic cues he named 5 tools. With extended time and 1st letter cues he named 5 grandchildren. With extended time and 1 semantic cue he named 5 siblings.  To target word finding and sentence generation  Scientist, product/process development (VNeST) was utilized. The pt generated 3 subjects and objects for 3 verbs, (catch, shake, read) for a total of 9 subject objects. Pt required occasional min semantic cues. Pt generated 3 complex sentences by answering "wh" questions. Pt required usual min semantic and questioning cues cues to generate complex sentences.    02/21/23: Zachary [name] and [name] brought in photos of some of his tools - naming tools with frequent mod verbal and written cues. Generated personally relevant sentences re: tools to practice at home - see Patient Instructions. To target word finding and sentence generation  Scientist, product/process development (VNeST) was utilized. The pt generated 3 subjects and objects for 3 verbs (dive, wash, deliver), for a total of 9 subject objects. Pt required frequent mod verbal and written cues. Pt generated 3 complex sentences by answering "wh" questions. Pt required frequent mod verbal and written cues to generate complex sentences.    02/19/23: Zachary [name] and [name] bring in extended family list he has been practicing. Shlomie wrote his 2 daughters' names with supervision cues and named his 5 siblings with extended pauses, but mod I. With frequent mod written (fill in blank) and semantic/phonemic cues, Ahamed named 10 car parts and 5 tools with extended time. He wrote the car parts with consistent max copying cues for spelling. Frequent mod A to ID aphasic errors writing,as well as errors copying. Provided aphasia ID card today.   02/14/23 (eval day): aphasia ed; review homework (write family names and locations) and categorization homework for reading and writing   PATIENT EDUCATION: Education details: See Today's Treatment; See Patient Instructions;  compensations for aphasia Person educated: Patient, Spouse, and Child(ren) Education method: Explanation, Demonstration, and Verbal cues Education comprehension: verbalized understanding, returned demonstration, verbal cues required, and needs further education   GOALS: Goals reviewed with patient? Yes  SHORT TERM GOALS: Target date: 03/14/23  Pt will name 8 items in 3 personally relevant categories with usual min A Baseline: Goal status: MET  2.  Pt will name and write 8 family members with occasional min A Baseline:  Goal status: MET  3.  Pt will generate 2-3 verbal compensations for aphasia in structured task such as Semantic Feature Analysis (SFA) with usual min to mod A Baseline:  Goal status:MET  4.  Pt will comprehend simple social texts with occasional min A over 1 week Baseline:  Goal status: NOT MET  5.  Pt will use multimodal communication to answer personally relevant questions or take 2 turns in conversation with occasional min a Baseline:  Goal status: MET    LONG TERM GOALS: Target date: 04/11/23  Pt will generate 12 items in personally relevant categories with occasional min A Baseline:  Goal status: ONGOING  2.  Pt will generate moderately complex sentences in structured task such as Scientist, product/process development (VNeST) with occasional min A Baseline:  Goal status: MET  3.  Pt will respond to text with simple word/phrases 3x over 1 week  occasional min A Baseline:  Goal status: ONGOING  4.  Pt will ID and correct paraphasias in structured tasks with occasional min A Baseline:  Goal status: ONGOING  5.  Pt will ID and correct aphasic errors in written expression with occasional min A Baseline:  Goal status: ONGOING  6.  Pt will improve score on Communication Participation Item Bank by 4 points Baseline: 0;  Goal status:ONGOING  ASSESSMENT:  CLINICAL IMPRESSION: Patient is a 77 y.o. 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 He continues to improve and is able to name family members, simple tools and take 5-7 turns in conversation with occasional min A. Bexton is starting to communicate in simple interactions in the community. He continues to rely on family and his wife to repair communication break downs and to communicate more complex information in the community. He continues to demonstrate difficulty naming his tools and car parts when working in the shop and ordering parts. I recommend skilled ST to maximize communication for safety, independence and to reduce caregiver burden.   OBJECTIVE IMPAIRMENTS: include aphasia. These impairments are limiting patient from return to work, household responsibilities, ADLs/IADLs, and effectively communicating at home and in community. Factors affecting potential to achieve goals and functional outcome are severity of impairments. Patient will benefit from skilled SLP services to address above impairments and improve overall function.  REHAB POTENTIAL: Good  PLAN:  SLP FREQUENCY: 2x/week  SLP DURATION: 12 weeks  PLANNED INTERVENTIONS: 92507 Treatment of speech (30 or 45 min)    Quavion Boule, Radene Journey, CCC-SLP 04/02/2023, 1:58 PM

## 2023-04-04 ENCOUNTER — Encounter: Payer: Self-pay | Admitting: Speech Pathology

## 2023-04-04 ENCOUNTER — Ambulatory Visit: Payer: Medicare Other | Admitting: Speech Pathology

## 2023-04-04 DIAGNOSIS — R4701 Aphasia: Secondary | ICD-10-CM

## 2023-04-04 NOTE — Therapy (Signed)
OUTPATIENT SPEECH LANGUAGE PATHOLOGY APHASIA TREATMENT & DISCHARGE SUMMARY   Patient Name: Zachary [name] MRN: [id number] DOB:07-07-1945, 77 y.o., male Today's Date: 04/04/2023  PCP: Zachary Schlein, MD REFERRING PROVIDER: Loney Loh, NP  END OF SESSION:  End of Session - 04/04/23 1317     Visit Number 14    Number of Visits 25    Date for SLP Re-Evaluation 05/09/23    SLP Start Time 1315    SLP Stop Time  1400    SLP Time Calculation (min) 45 min    Activity Tolerance Patient tolerated treatment well             Past Medical History:  Diagnosis Date   Aortic valvar stenosis    Arthritis    Back pain    Erectile dysfunction    GERD (gastroesophageal reflux disease)    HTN (hypertension)    Hypercholesteremia    Urinary frequency    Past Surgical History:  Procedure Laterality Date   AORTIC VALVE REPLACEMENT N/A 03/25/2015   Procedure: AORTIC VALVE REPLACEMENT (AVR);  Surgeon: Alleen Borne, MD;  Location: [facility] OR;  Service: Open Heart Surgery;  Laterality: N/A;   CARDIAC CATHETERIZATION N/A 03/10/2015   Procedure: Right/Left Heart Cath and Coronary Angiography;  Surgeon: Lyn Records, MD;  Location: [facility] INVASIVE CV LAB;  Service: Cardiovascular;  Laterality: N/A;   CARDIAC CATHETERIZATION N/A 03/10/2015   Procedure: Intravascular Pressure Wire/FFR Study;  Surgeon: Lyn Records, MD;  Location: [facility] INVASIVE CV LAB;  Service: Cardiovascular;  Laterality: N/A;   LOOP RECORDER INSERTION N/A 02/12/2023   Procedure: LOOP RECORDER INSERTION;  Surgeon: [name], PA-C;  Location: [facility] INVASIVE CV LAB;  Service: Cardiovascular;  Laterality: N/A;   LUMBAR DISC SURGERY     TEE WITHOUT CARDIOVERSION N/A 03/25/2015   Procedure: TRANSESOPHAGEAL ECHOCARDIOGRAM (TEE);  Surgeon: Alleen Borne, MD;  Location: [facility] OR;  Service: Open Heart Surgery;  Laterality: N/A;   TONSILLECTOMY     Patient Active Problem List   Diagnosis Date Noted   Aphasia 02/09/2023    S/P AVR 03/25/2015   CAD (coronary artery disease), native coronary artery 03/10/2015   Hypertensive heart disease 03/06/2013    Class: Chronic   Hyperlipidemia 03/06/2013    Class: Chronic   GERD (gastroesophageal reflux disease) 03/06/2013    ONSET DATE: 02/09/23   REFERRING DIAG: R47.01 (ICD-10-CM) - Aphasia I63.9 (ICD-10-CM) - Acute ischemic stroke (HCC)  THERAPY DIAG:  Aphasia  Rationale for Evaluation and Treatment: Rehabilitation  SUBJECTIVE:   SUBJECTIVE STATEMENT: "This one was harder" re: homework Pt accompanied by: significant other and family member Spouse, Zachary [name]  TODAY'S TREATMENT:                                                                                                                                         DATE:   04/04/23: Zachary [name] responded  to my text in the lobby accurately and timely with mod I. Zachary [name] rates his language 90% back to normal. In structured task he generated 4 complex sentences with rare min A. He named 7 items that could be need repair on a car with rare min questioning cues - he does this for his job - goal is to return to work soon. Zachary [name] rated him on the Communicative Participation Item Bank - his score improved form 0 /30on all items (very much trouble) to 3's (no difficulty at all) and 2's (a little difficulty) to be a total of 25/30. He is pleased with his current functioning. D/C ST today.   04/02/23: Zachary [name] has successfully typed in and ordered car parts independently. Targeted divergent naming and written in season category - Zachary [name] wrote 20 Christmas words with usual min semantic cues. Frequent max A to correct written errors. Targeted verbal compensations for aphasia generating 3 similarities and differences to 2 pairs or related words with rare min questioning cues  03/28/23: Zachary [name] demonstrated use of photo app on phone and independently opens app and locates a photo. Targeted written expression in texting - today, he located text app  independently and responded to my text with extended time and rare min A. To target word finding and sentence generation  Scientist, product/process development (VNeST) was utilized. The pt generated 3 subjects and objects for 4 verbs (send, pass, measure, buy), for a total of 12 subject objects. Pt required occasional min cues. Pt generated 4 complex sentences by answering "wh" questions. Pt required occasional min semantic cues cues to generate complex sentences.    03/26/23: Targeted responding using text messaging - Zachary [name] required mod A to locate messages initially,then with occasional min extended time. With frequent mod A to use back space to correct errors and to use autofill as needed. He responded to work friends. Zachary [name] responded to my 3 texts with extended time and occasional min verbal and visual cues. In conversation, Zachary [name] spontaneously used gestures and verbal compensations to communicate 3 items he uses on his Christmas tree in conversation.   03/21/23: Targeted divergent naming in personally relevant category (car makes and models) - independently named 5 - with semantic, 1st letter, and fill in blank cues - he named 15. In conversation he utilized verbal and gesture compensations for aphasia 6/8 opportunities with rare min A. He is to bring in he phone next sessions to target texts. He successfully verbalized 4 opinions and 4 rationale for electric cars, self driving cars, jeeps, and self parking cars with rare min questioning cues/requests for clarification.   03/19/23: Zachary [name] has participated in interviewers and questions plumber and contractor re: leak in shower with support from Zachary [name]. Today, he generated 8 items for personally relevant categories (tools, yard, food he can make) with occasional min A. In conversation and naming task Zachary [name] used verbal compensations for aphasia with min verbal cues to successfully communicate 4/5x.   03/14/23: Zachary [name] returns with homework of listing 15  tools - he used internet for help, he Id'd 3 tools he couldn't name until this activity. They brought in Ipad with questions re: Talk Path - demonstrated exercises (writing and speaking) and 3 activities in each. Terreon completed level 3 sentence unscramble with initial min A. Targeted written expression and word finding verbalizing and writing names of friends and co-workers - Zachary [name] generated 8 names with occasional semantic cues from wife. He required frequent mod to max A to write names accurately and correct errors.  11/18/24Dorinda [name] had success ordering a car part in person. Targeted word finding and written expression in divergent naming task - he required frequent mod semantic and phrase completion cues to generate 11 basic car parts - he required consistent mod (fill in blank) and max - (copy cues). He required max A to ID spelling and copying errors and to correct these. 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   03/07/23: Zachary [name] named his 4 siblings, 4 grandchildren and 2 daughters with supervision cues and min extended time. Targeted verbal compensations for aphasia with SFA  chart and "tell me more" level 3 on Talk Path App. Estefan completed SFA chart for 7 words/photos with usual mod questioning cues and foil cues. Using SFA chart, he was able to name 5/7 items with usual mod written (fill in blank or 1st letter cues). Demonstrated 3 recommended home activities on Talk Path App.. In conversation, Rydin used gestures and verbal compensations for aphasia 2/6x with frequent mod questioning cues to elicit salient descriptions.  03/05/23: Mckinley returns [facility] completed correctly. Targeted verbal compensations for aphasia using semantic feature analysis (SFA) generating 3-4 descriptions of common objects in structured task with extended time consistently, usual min questioning cues  and semantic cues. Convergent naming task with occasional 1st letter or semantic cues - Christafer named 8/10. Compensations for restaurant ordering looking up menu prior (they are going to [facility]) -  role played placing drink order with mod I, ordering main and 2 sides with occasional questioning cues. Wrote down order for him to practice prior to going out. Included strategy of writing down a car part he needs to buy and bringing in the written order to A him in requesting the part independently rather than relying on his wife. SFA HW provided.   02/28/23: Targeted naming in personally relevant categories - with frequent semantic cues he named 5 tools. With extended time and 1st letter cues he named 5 grandchildren. With extended time and 1 semantic cue he named 5 siblings.  To target word finding and sentence generation  Scientist, product/process development (VNeST) was utilized. The pt generated 3 subjects and objects for 3 verbs, (catch, shake, read) for a total of 9 subject objects. Pt required occasional min semantic cues. Pt generated 3 complex sentences by answering "wh" questions. Pt required usual min semantic and questioning cues cues to generate complex sentences.    02/21/23: Zachary [name] and Zachary [name] brought in photos of some of his tools - naming tools with frequent mod verbal and written cues. Generated personally relevant sentences re: tools to practice at home - see Patient Instructions. To target word finding and sentence generation  Scientist, product/process development (VNeST) was utilized. The pt generated 3 subjects and objects for 3 verbs (dive, wash, deliver), for a total of 9 subject objects. Pt required frequent mod verbal and written cues. Pt generated 3 complex sentences by answering "wh" questions. Pt required frequent mod verbal and written cues to generate complex sentences.    02/19/23: Zachary [name] and Zachary [name] bring in extended family list he has been practicing. Kyung wrote his  2 daughters' names with supervision cues and named his 5 siblings with extended pauses, but mod I. With frequent mod written (fill in blank) and semantic/phonemic cues, Pranith named 10 car parts and 5 tools with extended time. He wrote the car parts with consistent  max copying cues for spelling. Frequent mod A to ID aphasic errors writing,as well as errors copying. Provided aphasia ID card today.   02/14/23 (eval day): aphasia ed; review homework (write family names and locations) and categorization homework for reading and writing   PATIENT EDUCATION: Education details: See Today's Treatment; See Patient Instructions; compensations for aphasia Person educated: Patient, Spouse, and Child(ren) Education method: Explanation, Demonstration, and Verbal cues Education comprehension: verbalized understanding, returned demonstration, verbal cues required, and needs further education   GOALS: Goals reviewed with patient? Yes  SHORT TERM GOALS: Target date: 03/14/23  Pt will name 8 items in 3 personally relevant categories with usual min A Baseline: Goal status: MET  2.  Pt will name and write 8 family members with occasional min A Baseline:  Goal status: MET  3.  Pt will generate 2-3 verbal compensations for aphasia in structured task such as Semantic Feature Analysis (SFA) with usual min to mod A Baseline:  Goal status:MET  4.  Pt will comprehend simple social texts with occasional min A over 1 week Baseline:  Goal status: NOT MET  5.  Pt will use multimodal communication to answer personally relevant questions or take 2 turns in conversation with occasional min a Baseline:  Goal status: MET    LONG TERM GOALS: Target date: 04/11/23  Pt will generate 12 items in personally relevant categories with occasional min A Baseline:  Goal status: MET  2.  Pt will generate moderately complex sentences in structured task such as Scientist, product/process development (VNeST) with occasional  min A Baseline:  Goal status: MET  3.  Pt will respond to text with simple word/phrases 3x over 1 week  occasional min A Baseline:  Goal status: MET  4.  Pt will ID and correct paraphasias in structured tasks with occasional min A Baseline:  Goal status: MET  5.  Pt will ID and correct aphasic errors in written expression with occasional min A Baseline:  Goal status: PARTIALLY MET  6.  Pt will improve score on Communication Participation Item Bank by 4 points Baseline: 0; pose 25 Goal status:MET  ASSESSMENT:  CLINICAL IMPRESSION: Patient is a 77 y.o. initially seen for severe expressive aphasia and moderate receptive aphasia which has improved to mild to moderate aphasia. He is ordering car parts independently, communicating in the community and taking 5-7 turns in conversation with occasional min A. He feels ready to return to work and feels that he will be able to communicate successfully at work. Goals met, d/c ST .   OBJECTIVE IMPAIRMENTS: include aphasia. These impairments are limiting patient from return to work, household responsibilities, ADLs/IADLs, and effectively communicating at home and in community. Factors affecting potential to achieve goals and functional outcome are severity of impairments. Patient will benefit from skilled SLP services to address above impairments and improve overall function.  REHAB POTENTIAL: Good  PLAN:  SLP FREQUENCY: 2x/week  SLP DURATION: 12 weeks  PLANNED INTERVENTIONS: 92507 Treatment of speech (30 or 45 min)    SPEECH THERAPY DISCHARGE SUMMARY  Visits from Start of Care: 14  Current functional level related to goals / functional outcomes: See goals above   Remaining deficits: Mild aphasia   Education / Equipment: Compensations for aphasia   Patient agrees to discharge. Patient goals were met. Patient is being discharged due to meeting the stated rehab goals.Dara Hoyer, CCC-SLP 04/04/2023, 1:17  PM

## 2023-04-05 ENCOUNTER — Ambulatory Visit: Payer: Medicare Other | Admitting: Neurology

## 2023-04-05 ENCOUNTER — Encounter: Payer: Self-pay | Admitting: Neurology

## 2023-04-05 ENCOUNTER — Ambulatory Visit: Payer: Self-pay | Admitting: Neurology

## 2023-04-05 VITALS — BP 133/72 | HR 74 | Ht 71.0 in | Wt 213.0 lb

## 2023-04-05 DIAGNOSIS — I6602 Occlusion and stenosis of left middle cerebral artery: Secondary | ICD-10-CM | POA: Diagnosis not present

## 2023-04-05 DIAGNOSIS — I69320 Aphasia following cerebral infarction: Secondary | ICD-10-CM

## 2023-04-05 NOTE — Progress Notes (Signed)
[facility] [address] [place]. [place] [address] [phone]       OFFICE FOLLOW-UP NOTE  Zachary [name] Date of Birth:  Nov 11, 1945 Medical Record Number:  [id number]   HPI: Zachary [name] is a 77 year old pleasant Caucasian male seen today for office follow-up visit following hospital admission for stroke in October 2024.  He is accompanied by his wife today.  History is obtained from them and review of electronic medical records.  I personally reviewed pertinent available imaging films in PACS.  He has past medical history of hypertension, hyperlipidemia, gastroesophageal reflux disease, aortic valve stenosis s/p AVR, coronary artery disease.  He presented on 02/09/2023 with sudden onset of inability to speak or comprehend and word hesitancy.  He was found to have dense receptive and expressive aphasia upon arrival to the emergency room and NIH stroke scale was 7.  CT head was unremarkable and CT angiogram showed no LVO.  He is received IV TNK.  He was admitted to the ICU and had close neurological monitoring and strict blood pressure control.  MRI showed restricted cortical diffusion in the left frontal lobe with no operculum.  He showed only mild initial improvement.  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  2D echo showed ejection fraction of 55 to 60% with dilatation of the left atrium.  EEG was negative for seizure activity.  LDL cholesterol 62 mg percent.  Hemoglobin A1c was 6.0.  He was seen by physical occupational and speech therapy.  Patient did sign consent to participate in the sleep smart study and tested positive on the NOx 3 monitor but he was unable to tolerate the CPAP mask and hence was a cane failure in the sleep smart study.  He was discharged home with home physical and speech therapy.  He states is done well.  His speech is almost completely back to normal though he has occasional word  finding difficulties and struggles when he is excited or tries to talk too fast.  He states he is 90% better.  He did have mild headache for a few days following his stroke but that seems to have all resolved now.  He is tolerating Plavix well without bleeding and only minor bruising.  He is tolerating Lipitor well without muscle aches and pains.  He has had no recurrent stroke or TIA symptoms.  He has no complaints.  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  ROS:   14 system review of systems is positive for occasional word finding and speech difficulty and bruising all other systems negative  PMH:  Past Medical History:  Diagnosis Date   Aortic valvar stenosis    Arthritis    Back pain    Erectile dysfunction    GERD (gastroesophageal reflux disease)    HTN (hypertension)    Hypercholesteremia    Urinary frequency     Social History:  Social History   Socioeconomic History   Marital status: Married    Spouse name: Not on file   Number of children: Not on file   Years of education: Not on file   Highest education level: Not on file  Occupational History   Not on file  Tobacco Use   Smoking status: Never   Smokeless tobacco: Never  Vaping Use   Vaping status: Never Used  Substance and Sexual Activity   Alcohol use: No    Alcohol/week: 0.0 standard  drinks of alcohol   Drug use: No   Sexual activity: Not on file  Other Topics Concern   Not on file  Social History Narrative   Not on file   Social Drivers of Health   Financial Resource Strain: Not on file  Food Insecurity: Not on file  Transportation Needs: Not on file  Physical Activity: Not on file  Stress: Not on file  Social Connections: Not on file  Intimate Partner Violence: Not on file    Medications:   Current Outpatient Medications on File Prior to Visit  Medication Sig Dispense Refill   acetaminophen (TYLENOL) 325 MG tablet Take 650 mg by mouth every 6 (six) hours as needed for mild pain.      amoxicillin (AMOXIL) 500 MG tablet Take 2,000 mg by mouth as needed (Take one (1) hour prior to dental procedures.).     atorvastatin (LIPITOR) 40 MG tablet Take 40 mg by mouth daily.     clopidogrel (PLAVIX) 75 MG tablet Take 1 tablet (75 mg total) by mouth daily. 30 tablet 3   Melatonin 10 MG TBDP Take 1 tablet by mouth at bedtime.     Multiple Vitamins-Minerals (ONE A DAY MENS VITACRAVES) CHEW Chew 1 Dose by mouth daily.     No current facility-administered medications on file prior to visit.    Allergies:  No Known Allergies  Physical Exam General: well developed, well nourished pleasant elderly Caucasian male, seated, in no evident distress Head: head normocephalic and atraumatic.  Neck: supple with no carotid or supraclavicular bruits Cardiovascular: regular rate and rhythm, no murmurs Musculoskeletal: no deformity Skin:  no rash/petichiae Vascular:  Normal pulses all extremities Vitals:   04/05/23 1519  BP: 133/72  Pulse: 74   Neurologic Exam Mental Status: Awake and fully alert. Oriented to place and time. Recent and remote memory intact. Attention span, concentration and fund of knowledge appropriate. Mood and affect appropriate.  Speech is clear without dysarthria or aphasia. Cranial Nerves: Fundoscopic exam not done s. Pupils equal, briskly reactive to light. Extraocular movements full without nystagmus. Visual fields full to confrontation. Hearing intact. Facial sensation intact. Face, tongue, palate moves normally and symmetrically.  Motor: Normal bulk and tone. Normal strength in all tested extremity muscles. Sensory.: intact to touch ,pinprick .position and vibratory sensation.  Coordination: Rapid alternating movements normal in all extremities. Finger-to-nose and heel-to-shin performed accurately bilaterally. Gait and Station: Arises from chair without difficulty. Stance is normal. Gait demonstrates normal stride length and balance . Able to heel, toe and tandem walk  with slight difficulty.  Reflexes: 1+ and symmetric. Toes downgoing.    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  Vascular risk factors of hyperlipidemia and obstructive sleep apnea only     PLAN:I had a long d/w patient and his wife about his recent cryptogenic stroke, mild residual aphasia, risk for recurrent stroke/TIAs, personally independently reviewed imaging studies and stroke evaluation results and answered questions.Continue Plavix 75 mg daily for secondary stroke prevention and maintain strict control of hypertension with blood pressure goal below 130/90, diabetes with hemoglobin A1c goal below 6.5% and lipids with LDL cholesterol goal below 70 mg/dL. I also advised the patient to eat a healthy diet with plenty of whole grains, cereals, fruits and vegetables, exercise regularly and maintain ideal  body weight .patient is neurologically cleared to return to work and to drive without restrictions since he is doing so well.  Patient has obstructive sleep apnea but has been unable to tolerate CPAP mask.   followup in the future with me in 6 months or call earlier if necessary.  Greater than 50% of time during this 35 minute visit was spent on counseling,explanation of diagnosis cryptogenic stroke and aphasia and planning of further management, discussion with patient and family and coordination of care Delia Heady, MD Note: This document was prepared with digital dictation and possible smart phrase technology. Any transcriptional errors that result from this process are unintentional

## 2023-04-05 NOTE — Patient Instructions (Signed)
I had a long d/w patient and his wife about his recent cryptogenic stroke, mild residual aphasia, risk for recurrent stroke/TIAs, personally independently reviewed imaging studies and stroke evaluation results and answered questions.Continue Plavix 75 mg daily for secondary stroke prevention and maintain strict control of hypertension with blood pressure goal below 130/90, diabetes with hemoglobin A1c goal below 6.5% and lipids with LDL cholesterol goal below 70 mg/dL. I also advised the patient to eat a healthy diet with plenty of whole grains, cereals, fruits and vegetables, exercise regularly and maintain ideal body weight .patient is neurologically cleared to return to work and to drive without restrictions since he is doing so well.  Followup in the future with me in 6 months or call earlier if necessary.  Stroke Prevention Some medical conditions and behaviors can lead to a higher chance of having a stroke. You can help prevent a stroke by eating healthy, exercising, not smoking, and managing any medical conditions you have. Stroke is a leading cause of functional impairment. Primary prevention is particularly important because a majority of strokes are first-time events. Stroke changes the lives of not only those who experience a stroke but also their family and other caregivers. How can this condition affect me? A stroke is a medical emergency and should be treated right away. A stroke can lead to brain damage and can sometimes be life-threatening. If a person gets medical treatment right away, there is a better chance of surviving and recovering from a stroke. What can increase my risk? The following medical conditions may increase your risk of a stroke: Cardiovascular disease. High blood pressure (hypertension). Diabetes. High cholesterol. Sickle cell disease. Blood clotting disorders (hypercoagulable state). Obesity. Sleep disorders (obstructive sleep apnea). Other risk factors  include: Being older than age 49. Having a history of blood clots, stroke, or mini-stroke (transient ischemic attack, TIA). Genetic factors, such as race, ethnicity, or a family history of stroke. Smoking cigarettes or using other tobacco products. Taking birth control pills, especially if you also use tobacco. Heavy use of alcohol or drugs, especially cocaine and methamphetamine. Physical inactivity. What actions can I take to prevent this? Manage your health conditions High cholesterol levels. Eating a healthy diet is important for preventing high cholesterol. If cholesterol cannot be managed through diet alone, you may need to take medicines. Take any prescribed medicines to control your cholesterol as told by your health care provider. Hypertension. To reduce your risk of stroke, try to keep your blood pressure below 130/80. Eating a healthy diet and exercising regularly are important for controlling blood pressure. If these steps are not enough to manage your blood pressure, you may need to take medicines. Take any prescribed medicines to control hypertension as told by your health care provider. Ask your health care provider if you should monitor your blood pressure at home. Have your blood pressure checked every year, even if your blood pressure is normal. Blood pressure increases with age and some medical conditions. Diabetes. Eating a healthy diet and exercising regularly are important parts of managing your blood sugar (glucose). If your blood sugar cannot be managed through diet and exercise, you may need to take medicines. Take any prescribed medicines to control your diabetes as told by your health care provider. Get evaluated for obstructive sleep apnea. Talk to your health care provider about getting a sleep evaluation if you snore a lot or have excessive sleepiness. Make sure that any other medical conditions you have, such as atrial fibrillation or atherosclerosis, are  managed. Nutrition Follow instructions from your health care provider about what to eat or drink to help manage your health condition. These instructions may include: Reducing your daily calorie intake. Limiting how much salt (sodium) you use to 1,500 milligrams (mg) each day. Using only healthy fats for cooking, such as olive oil, canola oil, or sunflower oil. Eating healthy foods. You can do this by: Choosing foods that are high in fiber, such as whole grains, and fresh fruits and vegetables. Eating at least 5 servings of fruits and vegetables a day. Try to fill one-half of your plate with fruits and vegetables at each meal. Choosing lean protein foods, such as lean cuts of meat, poultry without skin, fish, tofu, beans, and nuts. Eating low-fat dairy products. Avoiding foods that are high in sodium. This can help lower blood pressure. Avoiding foods that have saturated fat, trans fat, and cholesterol. This can help prevent high cholesterol. Avoiding processed and prepared foods. Counting your daily carbohydrate intake.  Lifestyle If you drink alcohol: Limit how much you have to: 0-1 drink a day for women who are not pregnant. 0-2 drinks a day for men. Know how much alcohol is in your drink. In the [place] one drink equals one 12 oz bottle of beer ( ), one 5 oz glass of wine ( ), or one 1 oz glass of hard liquor (44mL). Do not use any products that contain nicotine or tobacco. These products include cigarettes, chewing tobacco, and vaping devices, such as e-cigarettes. If you need help quitting, ask your health care provider. Avoid secondhand smoke. Do not use drugs. Activity  Try to stay at a healthy weight. Get at least 30 minutes of exercise on most days, such as: Fast walking. Biking. Swimming. Medicines Take over-the-counter and prescription medicines only as told by your health care provider. Aspirin or blood thinners (antiplatelets or anticoagulants) may be recommended  to reduce your risk of forming blood clots that can lead to stroke. Avoid taking birth control pills. Talk to your health care provider about the risks of taking birth control pills if: You are over 70 years old. You smoke. You get very bad headaches. You have had a blood clot. Where to find more information American Stroke Association: www.strokeassociation.org Get help right away if: You or a loved one has any symptoms of a stroke. "BE FAST" is an easy way to remember the main warning signs of a stroke: B - Balance. Signs are dizziness, sudden trouble walking, or loss of balance. E - Eyes. Signs are trouble seeing or a sudden change in vision. F - Face. Signs are sudden weakness or numbness of the face, or the face or eyelid drooping on one side. A - Arms. Signs are weakness or numbness in an arm. This happens suddenly and usually on one side of the body. S - Speech. Signs are sudden trouble speaking, slurred speech, or trouble understanding what people say. T - Time. Time to call emergency services. Write down what time symptoms started. You or a loved one has other signs of a stroke, such as: A sudden, severe headache with no known cause. Nausea or vomiting. Seizure. These symptoms may represent a serious problem that is an emergency. Do not wait to see if the symptoms will go away. Get medical help right away. Call your local emergency services (911 in the [place] Do not drive yourself to the hospital. Summary You can help to prevent a stroke by eating healthy, exercising, not smoking, limiting alcohol intake, and managing  any medical conditions you may have. Do not use any products that contain nicotine or tobacco. These include cigarettes, chewing tobacco, and vaping devices, such as e-cigarettes. If you need help quitting, ask your health care provider. Remember "BE FAST" for warning signs of a stroke. Get help right away if you or a loved one has any of these signs. This information  is not intended to replace advice given to you by your health care provider. Make sure you discuss any questions you have with your health care provider. Document Revised: 03/13/2022 Document Reviewed: 03/13/2022 Elsevier Patient Education  2024 [employer].

## 2023-04-09 ENCOUNTER — Ambulatory Visit: Payer: Medicare Other | Admitting: Speech Pathology

## 2023-04-11 ENCOUNTER — Encounter: Payer: Medicare Other | Admitting: Speech Pathology

## 2023-04-19 NOTE — Progress Notes (Signed)
Carelink Summary Report / Loop Recorder 

## 2023-04-19 NOTE — Addendum Note (Signed)
Addended by: Geralyn Flash D on: 04/19/2023 04:43 PM   Modules accepted: Orders

## 2023-04-23 ENCOUNTER — Ambulatory Visit: Payer: Medicare Other

## 2023-04-23 DIAGNOSIS — I639 Cerebral infarction, unspecified: Secondary | ICD-10-CM | POA: Diagnosis not present

## 2023-04-24 LAB — CUP PACEART REMOTE DEVICE CHECK
Date Time Interrogation Session: 20241229232318
Implantable Pulse Generator Implant Date: 20241021

## 2023-05-23 ENCOUNTER — Other Ambulatory Visit: Payer: Self-pay

## 2023-05-23 NOTE — Patient Outreach (Signed)
Telephone outreach to patient's spouse to obtain mRS was successfully completed. MRS= 1  Vanice Sarah [facility] Care Management Assistant  Direct Dial: [phone]  Fax: [phone] Website: [email or web address]

## 2023-05-28 ENCOUNTER — Ambulatory Visit (INDEPENDENT_AMBULATORY_CARE_PROVIDER_SITE_OTHER): Payer: Medicare Other

## 2023-05-28 DIAGNOSIS — I639 Cerebral infarction, unspecified: Secondary | ICD-10-CM | POA: Diagnosis not present

## 2023-05-28 DIAGNOSIS — Z95818 Presence of other cardiac implants and grafts: Secondary | ICD-10-CM | POA: Diagnosis not present

## 2023-05-28 DIAGNOSIS — Z7901 Long term (current) use of anticoagulants: Secondary | ICD-10-CM | POA: Diagnosis not present

## 2023-05-28 LAB — CUP PACEART REMOTE DEVICE CHECK
Date Time Interrogation Session: 20250202232350
Implantable Pulse Generator Implant Date: 20241021

## 2023-06-28 DIAGNOSIS — K080 Exfoliation of teeth due to systemic causes: Secondary | ICD-10-CM | POA: Diagnosis not present

## 2023-07-02 ENCOUNTER — Ambulatory Visit (INDEPENDENT_AMBULATORY_CARE_PROVIDER_SITE_OTHER): Payer: Medicare Other

## 2023-07-02 DIAGNOSIS — I639 Cerebral infarction, unspecified: Secondary | ICD-10-CM | POA: Diagnosis not present

## 2023-07-03 LAB — CUP PACEART REMOTE DEVICE CHECK
Date Time Interrogation Session: 20250309232259
Implantable Pulse Generator Implant Date: 20241021

## 2023-07-04 NOTE — Progress Notes (Signed)
 Carelink Summary Report / Loop Recorder

## 2023-08-03 NOTE — Progress Notes (Signed)
 Carelink Summary Report / Loop Recorder

## 2023-08-03 NOTE — Addendum Note (Signed)
 Addended by: Geralyn Flash D on: 08/03/2023 03:50 PM   Modules accepted: Orders

## 2023-08-06 ENCOUNTER — Ambulatory Visit (INDEPENDENT_AMBULATORY_CARE_PROVIDER_SITE_OTHER): Payer: Medicare Other

## 2023-08-06 DIAGNOSIS — I639 Cerebral infarction, unspecified: Secondary | ICD-10-CM

## 2023-08-07 LAB — CUP PACEART REMOTE DEVICE CHECK
Date Time Interrogation Session: 20250413232122
Implantable Pulse Generator Implant Date: 20241021

## 2023-08-09 DIAGNOSIS — Z0000 Encounter for general adult medical examination without abnormal findings: Secondary | ICD-10-CM | POA: Diagnosis not present

## 2023-08-09 DIAGNOSIS — R7303 Prediabetes: Secondary | ICD-10-CM | POA: Diagnosis not present

## 2023-08-09 DIAGNOSIS — Z23 Encounter for immunization: Secondary | ICD-10-CM | POA: Diagnosis not present

## 2023-08-09 DIAGNOSIS — E7800 Pure hypercholesterolemia, unspecified: Secondary | ICD-10-CM | POA: Diagnosis not present

## 2023-08-09 DIAGNOSIS — I10 Essential (primary) hypertension: Secondary | ICD-10-CM | POA: Diagnosis not present

## 2023-08-09 DIAGNOSIS — I25119 Atherosclerotic heart disease of native coronary artery with unspecified angina pectoris: Secondary | ICD-10-CM | POA: Diagnosis not present

## 2023-08-09 DIAGNOSIS — Z1331 Encounter for screening for depression: Secondary | ICD-10-CM | POA: Diagnosis not present

## 2023-09-10 ENCOUNTER — Ambulatory Visit (INDEPENDENT_AMBULATORY_CARE_PROVIDER_SITE_OTHER): Payer: Medicare Other

## 2023-09-10 DIAGNOSIS — I639 Cerebral infarction, unspecified: Secondary | ICD-10-CM

## 2023-09-10 LAB — CUP PACEART REMOTE DEVICE CHECK
Date Time Interrogation Session: 20250518233826
Implantable Pulse Generator Implant Date: 20241021

## 2023-09-13 ENCOUNTER — Ambulatory Visit: Payer: Self-pay | Admitting: Cardiology

## 2023-09-24 NOTE — Progress Notes (Signed)
 Carelink Summary Report / Loop Recorder

## 2023-09-24 NOTE — Addendum Note (Signed)
 Addended by: Edra Govern D on: 09/24/2023 05:04 PM   Modules accepted: Orders

## 2023-10-11 ENCOUNTER — Encounter: Payer: Self-pay | Admitting: Cardiology

## 2023-10-11 ENCOUNTER — Ambulatory Visit (INDEPENDENT_AMBULATORY_CARE_PROVIDER_SITE_OTHER): Payer: Self-pay

## 2023-10-11 ENCOUNTER — Ambulatory Visit: Attending: Cardiology | Admitting: Cardiology

## 2023-10-11 ENCOUNTER — Ambulatory Visit: Payer: Self-pay | Admitting: Cardiology

## 2023-10-11 VITALS — BP 106/60 | HR 80 | Ht 71.0 in | Wt 210.4 lb

## 2023-10-11 DIAGNOSIS — I25118 Atherosclerotic heart disease of native coronary artery with other forms of angina pectoris: Secondary | ICD-10-CM

## 2023-10-11 DIAGNOSIS — Z952 Presence of prosthetic heart valve: Secondary | ICD-10-CM | POA: Diagnosis not present

## 2023-10-11 DIAGNOSIS — I639 Cerebral infarction, unspecified: Secondary | ICD-10-CM | POA: Diagnosis not present

## 2023-10-11 LAB — CUP PACEART REMOTE DEVICE CHECK
Date Time Interrogation Session: 20250618233155
Implantable Pulse Generator Implant Date: 20241021

## 2023-10-11 NOTE — Progress Notes (Signed)
 Cardiology Office Note:  .   Date:  10/11/2023  ID:  [name], DOB [birth year], MRN [id number] PCP: Faustina Hood, MD  [facility] Providers Cardiologist:  Dorothye Gathers, MD    History of Present Illness: .   Zachary [name] is a 78 y.o. male Discussed the use of AI scribe software for clinical note transcription with the patient, who gave verbal consent to proceed.  History of Present Illness Zachary [name] is a 78 year old male with coronary artery disease and aortic valve replacement who presents for follow-up.  He underwent a left heart catheterization in August 2016 and has a history of LAD disease and aortic stenosis, for which he received an [facility] E's pericardial valve replacement in December 2016. His ejection fraction is 65%.  He has a history of diabetes and a prior left eye stroke. He experienced a stroke in October 2024, which affected his speech and writing abilities. His speech has mostly returned, although he occasionally struggles with word retrieval. He has not been to therapy for this issue recently.  No current shortness of breath, but he experiences occasional dizziness. He has phlegm in his throat, which he notices when getting up from a seated position. No recent changes in his condition since the stroke.  He is currently taking Plavix  and atorvastatin  40 mg. He no longer takes aspirin  81 mg. He uses Tylenol  for arthritis pain, which sometimes affects his lower back and one side.  He has a monitoring device for atrial fibrillation, which has not detected any episodes of Afib. He reports discomfort during the insertion of the device.      Studies Reviewed: .        Results LABS LDL: 70  RADIOLOGY CT scan: Left frontal lobe infarct, no bleeding (01/2023) MRI: Left frontal lobe suggestive of hyperacute infarct, no bleeding (01/2023) Chest x-ray: Normal (2017)  DIAGNOSTIC Echocardiogram: Normal pump function, normal valve function, aorta 40  mm, minimally dilated (01/2023) Risk Assessment/Calculations:            Physical Exam:   VS:  BP 106/60 (BP Location: Left Arm, Patient Position: Sitting, Cuff Size: Normal)   Pulse 80   Ht 5' 11 (1.803 m)   Wt 210 lb 6.4 oz (95.4 kg)   SpO2 95%   BMI 29.34 kg/m    Wt Readings from Last 3 Encounters:  10/11/23 210 lb 6.4 oz (95.4 kg)  04/05/23 213 lb (96.6 kg)  02/09/23 213 lb 6.5 oz (96.8 kg)    GEN: Well nourished, well developed in no acute distress NECK: No JVD; No carotid bruits CARDIAC: RRR, no significant murmurs, no rubs, no gallops RESPIRATORY:  Mild crackles at bases ABDOMEN: Soft, non-tender, non-distended EXTREMITIES:  No edema; No deformity   ASSESSMENT AND PLAN: .    Assessment and Plan Assessment & Plan Coronary artery disease Coronary artery disease with preserved ejection fraction of 65%. No prior stent placement. Negative FFR in 2016. No current symptoms of dyspnea or angina. LDL cholesterol level is 70, within target range. - Continue atorvastatin  40 mg daily - Continue clopidogrel  as prescribed - No need for aspirin  due to clopidogrel  use  Aortic valve replacement Aortic valve replacement with [facility] Ease pericardial valve in December 2016. Recent echocardiogram shows normal valve function. Valve durability typically ranges from 10 to 15 years, with no current symptoms of valve dysfunction.  Atrial fibrillation monitoring (Implantable loop recorder) Currently monitored for atrial fibrillation with no episodes detected. Extended  monitoring recommended, potentially up to three years, to detect any episodes. - Continue atrial fibrillation monitoring for an extended period, potentially up to three years  Stroke Left frontal lobe stroke in October 2024. Residual speech difficulties but significant recovery.  Lung crackles ? ILD. Could consider further workup.   Arthritis pain Intermittent arthritis pain managed with acetaminophen  650 mg as  needed. No NSAIDs due to concurrent medication regimen. - Continue acetaminophen  650 mg as needed for arthritis pain         Dispo: 1 yr  Signed, Dorothye Gathers, MD

## 2023-10-11 NOTE — Patient Instructions (Signed)

## 2023-10-29 NOTE — Progress Notes (Signed)
 Carelink Summary Report / Loop Recorder

## 2023-11-12 ENCOUNTER — Ambulatory Visit (INDEPENDENT_AMBULATORY_CARE_PROVIDER_SITE_OTHER): Payer: Self-pay

## 2023-11-12 DIAGNOSIS — I639 Cerebral infarction, unspecified: Secondary | ICD-10-CM

## 2023-11-13 LAB — CUP PACEART REMOTE DEVICE CHECK
Date Time Interrogation Session: 20250720234257
Implantable Pulse Generator Implant Date: 20241021

## 2023-11-20 ENCOUNTER — Ambulatory Visit: Payer: Self-pay | Admitting: Cardiology

## 2023-11-29 ENCOUNTER — Other Ambulatory Visit (HOSPITAL_COMMUNITY): Payer: Medicare Other

## 2023-12-13 ENCOUNTER — Ambulatory Visit: Payer: Self-pay | Admitting: Cardiology

## 2023-12-13 ENCOUNTER — Ambulatory Visit (INDEPENDENT_AMBULATORY_CARE_PROVIDER_SITE_OTHER): Payer: Self-pay

## 2023-12-13 DIAGNOSIS — I639 Cerebral infarction, unspecified: Secondary | ICD-10-CM | POA: Diagnosis not present

## 2023-12-13 LAB — CUP PACEART REMOTE DEVICE CHECK
Date Time Interrogation Session: 20250820235113
Implantable Pulse Generator Implant Date: 20241021

## 2023-12-13 NOTE — Progress Notes (Signed)
 Remote Loop Recorder Transmission

## 2023-12-27 ENCOUNTER — Ambulatory Visit: Payer: Medicare Other | Admitting: Neurology

## 2023-12-27 ENCOUNTER — Encounter: Payer: Self-pay | Admitting: Neurology

## 2023-12-27 VITALS — BP 128/74 | HR 92 | Ht 72.0 in | Wt 208.0 lb

## 2023-12-27 DIAGNOSIS — G4733 Obstructive sleep apnea (adult) (pediatric): Secondary | ICD-10-CM

## 2023-12-27 DIAGNOSIS — Z8673 Personal history of transient ischemic attack (TIA), and cerebral infarction without residual deficits: Secondary | ICD-10-CM

## 2023-12-27 NOTE — Progress Notes (Signed)
 [facility] [address] [place]. [place] [address] [phone]       OFFICE FOLLOW-UP NOTE  Mr. DELAN KSIAZEK Date of Birth:  Sep 19, 1945 Medical Record Number:  [id number]   HPI: Initial visit 04/05/2023 Mr. Calame is a 78 year old pleasant Caucasian male seen today for office follow-up visit following hospital admission for stroke in October 2024.  He is accompanied by his wife today.  History is obtained from them and review of electronic medical records.  I personally reviewed pertinent available imaging films in PACS.  He has past medical history of hypertension, hyperlipidemia, gastroesophageal reflux disease, aortic valve stenosis s/p AVR, coronary artery disease.  He presented on 02/09/2023 with sudden onset of inability to speak or comprehend and word hesitancy.  He was found to have dense receptive and expressive aphasia upon arrival to the emergency room and NIH stroke scale was 7.  CT head was unremarkable and CT angiogram showed no LVO.  He is received IV TNK.  He was admitted to the ICU and had close neurological monitoring and strict blood pressure control.  MRI showed restricted cortical diffusion in the left frontal lobe with no operculum.  He showed only mild initial improvement.  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  2D echo showed ejection fraction of 55 to 60% with dilatation of the left atrium.  EEG was negative for seizure activity.  LDL cholesterol 62 mg percent.  Hemoglobin A1c was 6.0.  He was seen by physical occupational and speech therapy.  Patient did sign consent to participate in the sleep smart study and tested positive on the NOx 3 monitor but he was unable to tolerate the CPAP mask and hence was a cane failure in the sleep smart study.  He was discharged home with home physical and speech therapy.  He states is done well.  His speech is almost completely back to normal though he  has occasional word finding difficulties and struggles when he is excited or tries to talk too fast.  He states he is 90% better.  He did have mild headache for a few days following his stroke but that seems to have all resolved now.  He is tolerating Plavix  well without bleeding and only minor bruising.  He is tolerating Lipitor well without muscle aches and pains.  He has had no recurrent stroke or TIA symptoms.  He has no complaints.  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 Update 12/27/2023 : He returns for follow-up after last visit nearly 9 months ago.  He is doing well.  Has had no recurrence TIA or stroke symptoms.  He remains on Plavix  which is tolerating well without bruising or bleeding.  He is tolerating Crestor well without muscle aches and pains.  He states he had lab work in January by primary care physician and it was satisfactory.  His loop recorder interrogation on 12/12/2023 has not yet shown any paroxysmal A-fib.  He has no new complaints.  He denies any new health problems. ROS:   14 system review of systems is positive for occasional word finding and speech difficulty and bruising all other systems negative  PMH:  Past Medical History:  Diagnosis Date   Aortic valvar stenosis    Arthritis    Back pain    Erectile dysfunction    GERD (gastroesophageal reflux disease)    HTN (hypertension)    Hypercholesteremia  Urinary frequency     Social History:  Social History   Socioeconomic History   Marital status: Married    Spouse name: Not on file   Number of children: Not on file   Years of education: Not on file   Highest education level: Not on file  Occupational History   Not on file  Tobacco Use   Smoking status: Never   Smokeless tobacco: Never  Vaping Use   Vaping status: Never Used  Substance and Sexual Activity   Alcohol use: No    Alcohol/week: 0.0 standard drinks of alcohol   Drug use: No   Sexual activity: Not on file  Other Topics Concern   Not  on file  Social History Narrative   Not on file   Social Drivers of Health   Financial Resource Strain: Not on file  Food Insecurity: Not on file  Transportation Needs: Not on file  Physical Activity: Not on file  Stress: Not on file  Social Connections: Not on file  Intimate Partner Violence: Not on file    Medications:   Current Outpatient Medications on File Prior to Visit  Medication Sig Dispense Refill   acetaminophen  (TYLENOL  8 HOUR) 650 MG CR tablet Take 650 mg by mouth as needed for pain.     acetaminophen  (TYLENOL ) 325 MG tablet Take 650 mg by mouth every 6 (six) hours as needed for mild pain.     amoxicillin  (AMOXIL ) 500 MG tablet Take 2,000 mg by mouth as needed (Take one (1) hour prior to dental procedures.). (Patient not taking: Reported on 10/11/2023)     atorvastatin  (LIPITOR) 40 MG tablet Take 40 mg by mouth daily.     clopidogrel  (PLAVIX ) 75 MG tablet Take 1 tablet (75 mg total) by mouth daily. 30 tablet 3   Melatonin 10 MG TBDP Take 1 tablet by mouth at bedtime.     Multiple Vitamins-Minerals (ONE A DAY MENS VITACRAVES) CHEW Chew 1 Dose by mouth daily.     Multiple Vitamins-Minerals (ONE DAILY FOR MEN 50+ ADVANCED) TABS Take 1 capsule by mouth daily at 6 (six) AM. gummie     No current facility-administered medications on file prior to visit.    Allergies:  No Known Allergies  Physical Exam General: well developed, well nourished pleasant elderly Caucasian male, seated, in no evident distress Head: head normocephalic and atraumatic.  Neck: supple with no carotid or supraclavicular bruits Cardiovascular: regular rate and rhythm, no murmurs Musculoskeletal: no deformity Skin:  no rash/petichiae Vascular:  Normal pulses all extremities Vitals:   12/27/23 1401  BP: 128/74  Pulse: 92   Neurologic Exam Mental Status: Awake and fully alert. Oriented to place and time. Recent and remote memory intact. Attention span, concentration and fund of knowledge  appropriate. Mood and affect appropriate.  Speech is clear without dysarthria or aphasia. Cranial Nerves: Fundoscopic exam not done. Pupils equal, briskly reactive to light. Extraocular movements full without nystagmus. Visual fields full to confrontation. Hearing intact. Facial sensation intact. Face, tongue, palate moves normally and symmetrically.  Motor: Normal bulk and tone. Normal strength in all tested extremity muscles. Sensory.: intact to touch ,pinprick .position and vibratory sensation.  Coordination: Rapid alternating movements normal in all extremities. Finger-to-nose and heel-to-shin performed accurately bilaterally. Gait and Station: Arises from chair without difficulty. Stance is normal. Gait demonstrates normal stride length and balance . Able to heel, toe and tandem walk with slight difficulty.  Reflexes: 1+ and symmetric. Toes downgoing.  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  Vascular risk factors of hyperlipidemia and obstructive sleep apnea only     PLAN:I had a long d/w patient and his wife about his recent cryptogenic stroke, mild residual aphasia, risk for recurrent stroke/TIAs, personally independently reviewed imaging studies and stroke evaluation results and answered questions.Continue Plavix  75 mg daily for secondary stroke prevention and maintain strict control of hypertension with blood pressure goal below 130/90, diabetes with hemoglobin A1c goal below 6.5% and lipids with LDL cholesterol goal below 70 mg/dL. I also advised the patient to eat a healthy diet with plenty of whole grains, cereals, fruits and vegetables, exercise regularly and maintain ideal body weight .patient is neurologically cleared to return to work and to drive without restrictions since he is doing so well.Check screening  carotid doppplers.  Patient has obstructive sleep apnea but has been unable to tolerate CPAP mask.   followup in the future with me  only as needed or call earlier if necessary.    I personally spent a total of 35 minutes in the care of the patient today including getting/reviewing separately obtained history, performing a medically appropriate exam/evaluation, counseling and educating, placing orders, referring and communicating with other health care professionals, documenting clinical information in the EHR, independently interpreting results, and coordinating care.        Eather Popp, MD Note: This document was prepared with digital dictation and possible smart phrase technology. Any transcriptional errors that result from this process are unintentional

## 2023-12-27 NOTE — Patient Instructions (Signed)
 I had a long d/w patient and his wife about his recent cryptogenic stroke, mild residual aphasia, risk for recurrent stroke/TIAs, personally independently reviewed imaging studies and stroke evaluation results and answered questions.Continue Plavix  75 mg daily for secondary stroke prevention and maintain strict control of hypertension with blood pressure goal below 130/90, diabetes with hemoglobin A1c goal below 6.5% and lipids with LDL cholesterol goal below 70 mg/dL. I also advised the patient to eat a healthy diet with plenty of whole grains, cereals, fruits and vegetables, exercise regularly and maintain ideal body weight .patient is neurologically cleared to return to work and to drive without restrictions since he is doing so well.  Patient has obstructive sleep apnea but has been unable to tolerate CPAP mask.   followup in the future with me  only as needed or call earlier if necessary.

## 2024-01-14 ENCOUNTER — Ambulatory Visit (INDEPENDENT_AMBULATORY_CARE_PROVIDER_SITE_OTHER): Payer: Self-pay

## 2024-01-14 DIAGNOSIS — I25118 Atherosclerotic heart disease of native coronary artery with other forms of angina pectoris: Secondary | ICD-10-CM | POA: Diagnosis not present

## 2024-01-14 LAB — CUP PACEART REMOTE DEVICE CHECK
Date Time Interrogation Session: 20250921233800
Implantable Pulse Generator Implant Date: 20241021

## 2024-01-15 ENCOUNTER — Ambulatory Visit: Payer: Self-pay | Admitting: Cardiology

## 2024-01-15 NOTE — Progress Notes (Signed)
 Remote Loop Recorder Transmission

## 2024-01-23 DIAGNOSIS — K080 Exfoliation of teeth due to systemic causes: Secondary | ICD-10-CM | POA: Diagnosis not present

## 2024-01-28 NOTE — Progress Notes (Signed)
 Remote Loop Recorder Transmission

## 2024-02-01 ENCOUNTER — Ambulatory Visit (HOSPITAL_COMMUNITY)
Admission: RE | Admit: 2024-02-01 | Discharge: 2024-02-01 | Disposition: A | Discharge status: Home / Self Care | Source: Ambulatory Visit | Attending: Neurology | Admitting: Neurology

## 2024-02-01 DIAGNOSIS — Z8673 Personal history of transient ischemic attack (TIA), and cerebral infarction without residual deficits: Secondary | ICD-10-CM | POA: Diagnosis not present

## 2024-02-01 DIAGNOSIS — I779 Disorder of arteries and arterioles, unspecified: Secondary | ICD-10-CM | POA: Diagnosis not present

## 2024-02-03 ENCOUNTER — Ambulatory Visit: Payer: Self-pay | Admitting: Neurology

## 2024-02-14 ENCOUNTER — Ambulatory Visit

## 2024-02-14 DIAGNOSIS — I25118 Atherosclerotic heart disease of native coronary artery with other forms of angina pectoris: Secondary | ICD-10-CM

## 2024-02-14 DIAGNOSIS — E7800 Pure hypercholesterolemia, unspecified: Secondary | ICD-10-CM | POA: Diagnosis not present

## 2024-02-14 DIAGNOSIS — I119 Hypertensive heart disease without heart failure: Secondary | ICD-10-CM | POA: Diagnosis not present

## 2024-02-14 DIAGNOSIS — Z23 Encounter for immunization: Secondary | ICD-10-CM | POA: Diagnosis not present

## 2024-02-14 DIAGNOSIS — I679 Cerebrovascular disease, unspecified: Secondary | ICD-10-CM | POA: Diagnosis not present

## 2024-02-14 DIAGNOSIS — R7303 Prediabetes: Secondary | ICD-10-CM | POA: Diagnosis not present

## 2024-02-14 LAB — CUP PACEART REMOTE DEVICE CHECK
Date Time Interrogation Session: 20251022232646
Implantable Pulse Generator Implant Date: 20241021

## 2024-02-15 ENCOUNTER — Ambulatory Visit: Payer: Self-pay | Admitting: Cardiology

## 2024-02-15 NOTE — Progress Notes (Signed)
 Remote Loop Recorder Transmission

## 2024-03-16 ENCOUNTER — Ambulatory Visit

## 2024-03-19 ENCOUNTER — Ambulatory Visit

## 2024-03-19 DIAGNOSIS — I639 Cerebral infarction, unspecified: Secondary | ICD-10-CM

## 2024-03-19 LAB — CUP PACEART REMOTE DEVICE CHECK
Date Time Interrogation Session: 20251125233416
Implantable Pulse Generator Implant Date: 20241021

## 2024-03-21 NOTE — Progress Notes (Signed)
 Remote Loop Recorder Transmission

## 2024-03-27 ENCOUNTER — Ambulatory Visit: Payer: Self-pay | Admitting: Cardiology

## 2024-04-16 ENCOUNTER — Ambulatory Visit

## 2024-04-19 ENCOUNTER — Ambulatory Visit: Attending: Cardiology

## 2024-04-19 DIAGNOSIS — I639 Cerebral infarction, unspecified: Secondary | ICD-10-CM

## 2024-04-21 ENCOUNTER — Ambulatory Visit: Payer: Self-pay | Admitting: Cardiology

## 2024-04-21 LAB — CUP PACEART REMOTE DEVICE CHECK
Date Time Interrogation Session: 20251226232915
Implantable Pulse Generator Implant Date: 20241021

## 2024-04-28 NOTE — Progress Notes (Signed)
 Remote Loop Recorder Transmission

## 2024-05-17 ENCOUNTER — Ambulatory Visit

## 2024-05-20 ENCOUNTER — Ambulatory Visit: Payer: Self-pay | Admitting: Cardiology

## 2024-05-20 ENCOUNTER — Ambulatory Visit

## 2024-05-20 DIAGNOSIS — I639 Cerebral infarction, unspecified: Secondary | ICD-10-CM

## 2024-05-20 LAB — CUP PACEART REMOTE DEVICE CHECK
Date Time Interrogation Session: 20260126233134
Implantable Pulse Generator Implant Date: 20241021

## 2024-05-28 NOTE — Progress Notes (Signed)
 Remote Loop Recorder Transmission

## 2024-06-17 ENCOUNTER — Ambulatory Visit

## 2024-06-20 ENCOUNTER — Ambulatory Visit

## 2024-07-18 ENCOUNTER — Ambulatory Visit
